# Patient Record
Sex: Female | Born: 1935 | Race: White | Hispanic: No | Smoking: Former smoker
Health system: Southern US, Community
[De-identification: ages and names within clinical notes are randomized; demographics above are authoritative.]

## PROBLEM LIST (undated history)

## (undated) DIAGNOSIS — S329XXA Fracture of unspecified parts of lumbosacral spine and pelvis, initial encounter for closed fracture: Secondary | ICD-10-CM

## (undated) DIAGNOSIS — R4182 Altered mental status, unspecified: Secondary | ICD-10-CM

## (undated) DIAGNOSIS — Z87440 Personal history of urinary (tract) infections: Secondary | ICD-10-CM

## (undated) DIAGNOSIS — F419 Anxiety disorder, unspecified: Secondary | ICD-10-CM

## (undated) DIAGNOSIS — M1990 Unspecified osteoarthritis, unspecified site: Secondary | ICD-10-CM

## (undated) DIAGNOSIS — I493 Ventricular premature depolarization: Secondary | ICD-10-CM

## (undated) DIAGNOSIS — G319 Degenerative disease of nervous system, unspecified: Principal | ICD-10-CM

## (undated) DIAGNOSIS — I471 Supraventricular tachycardia, unspecified: Secondary | ICD-10-CM

## (undated) DIAGNOSIS — H5462 Unqualified visual loss, left eye, normal vision right eye: Secondary | ICD-10-CM

## (undated) DIAGNOSIS — J189 Pneumonia, unspecified organism: Secondary | ICD-10-CM

## (undated) DIAGNOSIS — M810 Age-related osteoporosis without current pathological fracture: Secondary | ICD-10-CM

## (undated) DIAGNOSIS — F0280 Dementia in other diseases classified elsewhere without behavioral disturbance: Secondary | ICD-10-CM

## (undated) DIAGNOSIS — I10 Essential (primary) hypertension: Secondary | ICD-10-CM

## (undated) DIAGNOSIS — I679 Cerebrovascular disease, unspecified: Secondary | ICD-10-CM

## (undated) DIAGNOSIS — G8929 Other chronic pain: Secondary | ICD-10-CM

## (undated) DIAGNOSIS — F411 Generalized anxiety disorder: Secondary | ICD-10-CM

## (undated) DIAGNOSIS — M542 Cervicalgia: Secondary | ICD-10-CM

## (undated) DIAGNOSIS — M79646 Pain in unspecified finger(s): Secondary | ICD-10-CM

## (undated) DIAGNOSIS — G309 Alzheimer's disease, unspecified: Secondary | ICD-10-CM

## (undated) DIAGNOSIS — I350 Nonrheumatic aortic (valve) stenosis: Secondary | ICD-10-CM

## (undated) HISTORY — DX: Degenerative disease of nervous system, unspecified: G31.9

## (undated) HISTORY — DX: Supraventricular tachycardia: I47.1

## (undated) HISTORY — DX: Anxiety disorder, unspecified: F41.9

## (undated) HISTORY — DX: Ventricular premature depolarization: I49.3

## (undated) HISTORY — DX: Personal history of urinary (tract) infections: Z87.440

## (undated) HISTORY — DX: Unspecified osteoarthritis, unspecified site: M19.90

## (undated) HISTORY — DX: Generalized anxiety disorder: F41.1

## (undated) HISTORY — DX: Cervicalgia: M54.2

## (undated) HISTORY — DX: Pain in unspecified finger(s): M79.646

## (undated) HISTORY — PX: GLAUCOMA SURGERY: SHX656

## (undated) HISTORY — PX: CATARACT EXTRACTION, BILATERAL: SHX1313

## (undated) HISTORY — PX: APPENDECTOMY: SHX54

## (undated) HISTORY — DX: Essential (primary) hypertension: I10

## (undated) HISTORY — DX: Supraventricular tachycardia, unspecified: I47.10

## (undated) HISTORY — DX: Alzheimer's disease, unspecified: G30.9

## (undated) HISTORY — DX: Other chronic pain: G89.29

## (undated) HISTORY — DX: Nonrheumatic aortic (valve) stenosis: I35.0

## (undated) HISTORY — DX: Dementia in other diseases classified elsewhere without behavioral disturbance: F02.80

## (undated) HISTORY — DX: Fracture of unspecified parts of lumbosacral spine and pelvis, initial encounter for closed fracture: S32.9XXA

## (undated) HISTORY — DX: Cerebrovascular disease, unspecified: I67.9

## (undated) HISTORY — DX: Age-related osteoporosis without current pathological fracture: M81.0

## (undated) HISTORY — DX: Altered mental status, unspecified: R41.82

## (undated) HISTORY — PX: TONSILLECTOMY: SUR1361

## (undated) HISTORY — DX: Unqualified visual loss, left eye, normal vision right eye: H54.62

---

## 1998-04-25 ENCOUNTER — Other Ambulatory Visit: Admission: RE | Admit: 1998-04-25 | Discharge: 1998-04-25 | Payer: Self-pay | Admitting: Internal Medicine

## 1998-08-12 ENCOUNTER — Other Ambulatory Visit: Admission: RE | Admit: 1998-08-12 | Discharge: 1998-08-12 | Payer: Self-pay | Admitting: Internal Medicine

## 1999-09-03 ENCOUNTER — Encounter: Admission: RE | Admit: 1999-09-03 | Discharge: 1999-09-03 | Payer: Self-pay | Admitting: Internal Medicine

## 1999-09-03 ENCOUNTER — Encounter: Payer: Self-pay | Admitting: Internal Medicine

## 1999-09-30 ENCOUNTER — Encounter: Admission: RE | Admit: 1999-09-30 | Discharge: 1999-09-30 | Payer: Self-pay | Admitting: Internal Medicine

## 1999-09-30 ENCOUNTER — Encounter: Payer: Self-pay | Admitting: Internal Medicine

## 2000-02-23 ENCOUNTER — Encounter: Admission: RE | Admit: 2000-02-23 | Discharge: 2000-02-23 | Payer: Self-pay | Admitting: Orthopedic Surgery

## 2000-02-23 ENCOUNTER — Encounter: Payer: Self-pay | Admitting: Orthopedic Surgery

## 2000-11-10 ENCOUNTER — Other Ambulatory Visit: Admission: RE | Admit: 2000-11-10 | Discharge: 2000-11-10 | Payer: Self-pay

## 2001-08-23 ENCOUNTER — Encounter: Admission: RE | Admit: 2001-08-23 | Discharge: 2001-08-23 | Payer: Self-pay | Admitting: Gastroenterology

## 2001-08-23 ENCOUNTER — Encounter: Payer: Self-pay | Admitting: Gastroenterology

## 2001-09-09 LAB — HM COLONOSCOPY

## 2001-09-14 ENCOUNTER — Ambulatory Visit (HOSPITAL_COMMUNITY): Admission: RE | Admit: 2001-09-14 | Discharge: 2001-09-14 | Payer: Self-pay

## 2004-06-23 ENCOUNTER — Emergency Department (HOSPITAL_COMMUNITY): Admission: EM | Admit: 2004-06-23 | Discharge: 2004-06-23 | Payer: Self-pay | Admitting: Emergency Medicine

## 2004-07-07 ENCOUNTER — Encounter: Admission: RE | Admit: 2004-07-07 | Discharge: 2004-07-07 | Payer: Self-pay | Admitting: Internal Medicine

## 2005-02-05 ENCOUNTER — Other Ambulatory Visit: Admission: RE | Admit: 2005-02-05 | Discharge: 2005-02-05 | Payer: Self-pay | Admitting: Internal Medicine

## 2005-10-15 ENCOUNTER — Encounter: Admission: RE | Admit: 2005-10-15 | Discharge: 2005-10-15 | Payer: Self-pay | Admitting: Internal Medicine

## 2005-11-01 DIAGNOSIS — I679 Cerebrovascular disease, unspecified: Secondary | ICD-10-CM

## 2005-11-01 HISTORY — DX: Cerebrovascular disease, unspecified: I67.9

## 2006-05-19 ENCOUNTER — Ambulatory Visit: Payer: Self-pay

## 2006-05-27 ENCOUNTER — Ambulatory Visit: Payer: Self-pay | Admitting: Cardiology

## 2006-12-16 ENCOUNTER — Encounter: Admission: RE | Admit: 2006-12-16 | Discharge: 2006-12-16 | Payer: Self-pay | Admitting: Internal Medicine

## 2006-12-22 ENCOUNTER — Encounter: Admission: RE | Admit: 2006-12-22 | Discharge: 2006-12-22 | Payer: Self-pay | Admitting: Internal Medicine

## 2007-10-17 ENCOUNTER — Encounter: Admission: RE | Admit: 2007-10-17 | Discharge: 2007-10-17 | Payer: Self-pay | Admitting: Internal Medicine

## 2009-01-14 ENCOUNTER — Ambulatory Visit: Payer: Self-pay | Admitting: Internal Medicine

## 2009-01-28 ENCOUNTER — Ambulatory Visit: Payer: Self-pay | Admitting: Internal Medicine

## 2009-05-16 ENCOUNTER — Encounter: Admission: RE | Admit: 2009-05-16 | Discharge: 2009-05-16 | Payer: Self-pay | Admitting: Internal Medicine

## 2009-05-16 ENCOUNTER — Ambulatory Visit: Payer: Self-pay | Admitting: Internal Medicine

## 2009-05-28 ENCOUNTER — Ambulatory Visit: Payer: Self-pay | Admitting: Internal Medicine

## 2009-05-28 ENCOUNTER — Ambulatory Visit (HOSPITAL_COMMUNITY): Admission: RE | Admit: 2009-05-28 | Discharge: 2009-05-28 | Payer: Self-pay | Admitting: Internal Medicine

## 2009-05-28 ENCOUNTER — Ambulatory Visit: Payer: Self-pay | Admitting: *Deleted

## 2009-05-28 ENCOUNTER — Encounter: Payer: Self-pay | Admitting: Internal Medicine

## 2009-09-04 ENCOUNTER — Ambulatory Visit: Payer: Self-pay | Admitting: Internal Medicine

## 2009-09-19 ENCOUNTER — Ambulatory Visit: Payer: Self-pay | Admitting: Internal Medicine

## 2010-02-10 ENCOUNTER — Ambulatory Visit: Payer: Self-pay | Admitting: Internal Medicine

## 2010-03-23 ENCOUNTER — Inpatient Hospital Stay (HOSPITAL_COMMUNITY): Admission: AC | Admit: 2010-03-23 | Discharge: 2010-03-25 | Payer: Self-pay

## 2010-03-25 ENCOUNTER — Encounter (INDEPENDENT_AMBULATORY_CARE_PROVIDER_SITE_OTHER): Payer: Self-pay | Admitting: General Surgery

## 2010-03-27 ENCOUNTER — Ambulatory Visit: Payer: Self-pay | Admitting: Internal Medicine

## 2010-03-28 ENCOUNTER — Encounter: Admission: RE | Admit: 2010-03-28 | Discharge: 2010-03-28 | Payer: Self-pay | Admitting: Internal Medicine

## 2010-03-28 ENCOUNTER — Ambulatory Visit: Payer: Self-pay | Admitting: Internal Medicine

## 2010-04-04 ENCOUNTER — Ambulatory Visit: Payer: Self-pay | Admitting: Internal Medicine

## 2010-04-16 ENCOUNTER — Ambulatory Visit: Payer: Self-pay | Admitting: Internal Medicine

## 2010-04-25 ENCOUNTER — Encounter: Payer: Self-pay | Admitting: Cardiology

## 2010-04-25 ENCOUNTER — Ambulatory Visit: Admission: RE | Admit: 2010-04-25 | Discharge: 2010-04-25 | Payer: Self-pay | Admitting: Internal Medicine

## 2010-04-25 ENCOUNTER — Ambulatory Visit: Payer: Self-pay | Admitting: Vascular Surgery

## 2010-04-25 ENCOUNTER — Encounter: Payer: Self-pay | Admitting: Internal Medicine

## 2010-04-25 ENCOUNTER — Ambulatory Visit: Payer: Self-pay | Admitting: Internal Medicine

## 2010-05-05 ENCOUNTER — Ambulatory Visit: Payer: Self-pay | Admitting: Internal Medicine

## 2010-05-09 ENCOUNTER — Ambulatory Visit: Payer: Self-pay | Admitting: Internal Medicine

## 2010-05-27 ENCOUNTER — Ambulatory Visit: Payer: Self-pay | Admitting: Cardiology

## 2010-05-27 DIAGNOSIS — I359 Nonrheumatic aortic valve disorder, unspecified: Secondary | ICD-10-CM | POA: Insufficient documentation

## 2010-05-27 DIAGNOSIS — R072 Precordial pain: Secondary | ICD-10-CM | POA: Insufficient documentation

## 2010-05-27 DIAGNOSIS — R002 Palpitations: Secondary | ICD-10-CM

## 2010-05-30 ENCOUNTER — Encounter: Admission: RE | Admit: 2010-05-30 | Discharge: 2010-05-30 | Payer: Self-pay | Admitting: Internal Medicine

## 2010-06-09 ENCOUNTER — Ambulatory Visit: Payer: Self-pay | Admitting: Internal Medicine

## 2010-06-17 ENCOUNTER — Encounter: Admission: RE | Admit: 2010-06-17 | Discharge: 2010-06-17 | Payer: Self-pay | Admitting: Internal Medicine

## 2010-06-24 ENCOUNTER — Ambulatory Visit: Payer: Self-pay | Admitting: Internal Medicine

## 2010-07-01 ENCOUNTER — Encounter: Admission: RE | Admit: 2010-07-01 | Discharge: 2010-09-04 | Payer: Self-pay | Admitting: Internal Medicine

## 2010-07-03 ENCOUNTER — Ambulatory Visit: Payer: Self-pay | Admitting: Internal Medicine

## 2010-08-07 ENCOUNTER — Ambulatory Visit: Payer: Self-pay | Admitting: Internal Medicine

## 2010-09-18 ENCOUNTER — Ambulatory Visit: Payer: Self-pay | Admitting: Internal Medicine

## 2010-09-22 ENCOUNTER — Encounter: Admission: RE | Admit: 2010-09-22 | Discharge: 2010-09-22 | Payer: Self-pay | Admitting: Internal Medicine

## 2010-09-22 ENCOUNTER — Ambulatory Visit: Payer: Self-pay | Admitting: Internal Medicine

## 2010-10-06 ENCOUNTER — Ambulatory Visit: Payer: Self-pay

## 2010-11-05 ENCOUNTER — Telehealth (INDEPENDENT_AMBULATORY_CARE_PROVIDER_SITE_OTHER): Payer: Self-pay | Admitting: *Deleted

## 2010-11-06 ENCOUNTER — Ambulatory Visit: Admission: RE | Admit: 2010-11-06 | Discharge: 2010-11-06 | Payer: Self-pay | Source: Home / Self Care

## 2010-11-06 ENCOUNTER — Encounter: Payer: Self-pay | Admitting: Cardiology

## 2010-12-01 ENCOUNTER — Ambulatory Visit
Admission: RE | Admit: 2010-12-01 | Discharge: 2010-12-01 | Payer: Self-pay | Source: Home / Self Care | Attending: Internal Medicine | Admitting: Internal Medicine

## 2010-12-01 ENCOUNTER — Encounter: Payer: Self-pay | Admitting: Internal Medicine

## 2010-12-01 ENCOUNTER — Telehealth (INDEPENDENT_AMBULATORY_CARE_PROVIDER_SITE_OTHER): Payer: Self-pay | Admitting: *Deleted
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
## 2010-12-11 NOTE — Assessment & Plan Note (Signed)
Summary: dizziness per pt request   Visit Type:  rov Primary Debroah Warren:  Dr. Lenord Fellers  CC:  palpitations.  History of Present Illness: The patient presents for evaluation of palpitations. She called to be added to the schedule after experiencing palpitations 2 nights ago. She described thumping or skipping heartbeat. This occurred repeatedly. She did not describe sustained tachycardia arrhythmias. She did not have presyncope or syncope.  She had no chest, neck or arm pain.  Last night she felt well and she has had no palpitaitons during the day.    Current Medications (verified): 1)  Detrol 2 Mg Tabs (Tolterodine Tartrate) .Marland Kitchen.. 1 Tab Once Daily 2)  Aspirin 81 Mg Tbec (Aspirin) .... Take One Tablet By Mouth Daily 3)  Icaps  Caps (Multiple Vitamins-Minerals) .... Take 1 Capsule By Mouth Once A Day 4)  Vitamin D 1000 Unit Tabs (Cholecalciferol) .... Take 1 Tablet By Mouth Once A Day 5)  B Complex  Tabs (B Complex Vitamins) .... Take 1 Tablet By Mouth Once A Day 6)  Miralax  Powd (Polyethylene Glycol 3350) .... Once Daily  Allergies: 1)  ! Codeine  Past History:  Past Medical History: Reviewed history from 05/27/2010 and no changes required. Glaucoma Mild anxiety PVCs  Past Surgical History: Reviewed history from 05/27/2010 and no changes required. Tonsillectomy Appendectomy Left eye surgery for glaucoma Eyelid surgery  Review of Systems       As stated in the HPI and negative for all other systems.   Vital Signs:  Patient profile:   77 year old female Height:      62 inches Weight:      121.50 pounds BMI:     22.30 Pulse rate:   83 / minute Pulse rhythm:   irregular BP sitting:   136 / 82  (right arm) Cuff size:   regular  Vitals Entered By: Danielle Rankin, CMA (November 06, 2010 4:40 PM)  Physical Exam  General:  Well developed, well nourished, in no acute distress. Head:  normocephalic and atraumatic Neck:  Neck supple, no JVD. No masses, thyromegaly or abnormal  cervical nodes. Chest Wall:  no deformities or breast masses noted Lungs:  Clear bilaterally to auscultation and percussion. Abdomen:  Bowel sounds positive; abdomen soft and non-tender without masses, organomegaly, or hernias noted. No hepatosplenomegaly. Msk:  Back lordosis, normal gait. Muscle strength and tone normal. Extremities:  No clubbing or cyanosis. Neurologic:  Alert and oriented x 3. Skin:  Intact without lesions or rashes. Cervical Nodes:  no significant adenopathy Inguinal Nodes:  no significant adenopathy Psych:  Normal affect.   Detailed Cardiovascular Exam  Neck    Carotids: Carotids full and equal bilaterally without bruits., transmitted systolic murmur    Neck Veins: Normal, no JVD.    Heart    Inspection: no deformities or lifts noted.      Palpation: normal PMI with no thrills palpable.      Auscultation: S1 and S2 within normal limits, no S3, no S4, no clicks, no rubs, 3/6 apical systolic murmur radiating up the aortic outflow tract, no diastolic murmurs  Vascular    Abdominal Aorta: no palpable masses, pulsations, or audible bruits.      Femoral Pulses: normal femoral pulses bilaterally.      Pedal Pulses: normal pedal pulses bilaterally.      Radial Pulses: normal radial pulses bilaterally.      Peripheral Circulation: no clubbing, cyanosis, or edema noted with normal capillary refill.     EKG  Procedure date:  11/06/2010  Findings:      Sinus rhythm, rate 83, left axis deviation, left atrial enlargement, no acute ST-T wave changes  Impression & Recommendations:  Problem # 1:  PALPITATIONS (ICD-785.1) I suspect that the patient has been feeling PVCs that have been documented before. I am going to give her metoprolol immediate release 25 mg to take as needed for palpitations. If she has more symptoms and this therapy doesn't work we will consider daily dosing or other medication. Orders: EKG w/ Interpretation (93000)  Problem # 2:  AORTIC VALVE  DISORDERS (ICD-424.1) She has mild aortic stenosis which we will follow clinically. She would not want aggressive therapy for this.  Patient Instructions: 1)  Your physician recommends that you schedule a follow-up appointment in: 3 months with Dr Antoine Poche 2)  Your physician has recommended you make the following change in your medication: take Metoprolol tartrate as needed daily Prescriptions: METOPROLOL TARTRATE 25 MG TABS (METOPROLOL TARTRATE) one daily as needed  #30 x 6   Entered by:   Charolotte Capuchin, RN   Authorized by:   Rollene Rotunda, MD, [facility]   Signed by:   Charolotte Capuchin, RN on 11/06/2010   Method used:   Electronically to        [facility] (retail)       [address]       [place], [place]  [id number]       Ph: [phone] or [phone]       Fax: [phone]   RxID:   [id number]  I have reviewed and approved all prescriptions at the time of this visit. Rollene Rotunda, MD, [facility]  November 06, 2010 5:45 PM

## 2010-12-11 NOTE — Progress Notes (Signed)
Summary: Dr Shar[facility] Office Note   Dr Sharlet [facility] Office Note   Imported By: Roderic Ovens 06/05/2010 10:25:00  _____________________________________________________________________  External Attachment:    Type:   Image     Comment:   External Document

## 2010-12-11 NOTE — Progress Notes (Signed)
Summary: pt wants to be seen today she had palpitations  Phone Note Call from Patient Call back at Home Phone [phone]   Caller: Patient Reason for Call: Talk to Nurse, Talk to Doctor Summary of Call: pt is having papitations,cold sweat, up all night this went on longer than before pt also was dizzy and nausa pt has not slept all night wants to seen today  Initial call taken by: Omer Jack,  November 05, 2010 8:34 AM  Follow-up for Phone Call        Pt. called stating that she was awake most of the night last night due to palpitations, dizziness and cold swets nauseas. Pt. states she  called the emergency services in her housing  fascility about 8:00 PM. The nurse took her V/S B/P 120/70  pulse 100 beats per minute. Pt. said, she had the palpitations most of the night. Pt. denies having palpitatins ,dizziness, cold swets nor nauseas  since 7:30 to 8:30 AM. She would like to be seen today. Follow-up by: Ollen Gross, RN, BSN,  November 05, 2010 9:22 AM  Additional Follow-up for Phone Call Additional follow up Details #1::        Per pt - no complaints or s/s today.  feels fine.  States this is new for her and would like to be "checked out".  Appt given for 11/29 at 4:30 pm.  Pt aware if s/s reoccur to call back Additional Follow-up by: Charolotte Capuchin, RN,  November 05, 2010 10:01 AM

## 2010-12-11 NOTE — Assessment & Plan Note (Signed)
Summary: EKG   WALK IN  [facility],RN  Nurse Visit   Vital Signs:  Patient profile:   77 year old female Height:      62 inches Weight:      7.75 pounds Pulse rate:   88 / minute Pulse rhythm:   irregular BP sitting:   158 / 78  (right arm)  Current Medications (verified): 1)  Detrol Dose? .... Once Daily 2)  Aspirin 81 Mg Tbec (Aspirin) .... Take One Tablet By Mouth Daily 3)  Icaps  Caps (Multiple Vitamins-Minerals) .... Take 1 Capsule By Mouth Once A Day 4)  Vitamin D 1000 Unit Tabs (Cholecalciferol) .... Take 1 Tablet By Mouth Once A Day 5)  B Complex  Tabs (B Complex Vitamins) .... Take 1 Tablet By Mouth Once A Day 6)  Miralax  Powd (Polyethylene Glycol 3350) .... Once Daily  Allergies (verified): 1)  ! Codeine Pt came in today for eval .  States that she wofke late Saturday/early Sunday AM with "fibulations of the heart" that lasted longer than usual.  She broke out in a sweat and was dizzy but went back to sleep.  Got up at the usual time and was checked by the nurse who told her to see her primary MD.  States she was going to see Dr Lenord Fellers today however she was told to come here.  Walked in to be seen,  pt denies any palps, SOB, dizziness or chest pain.  She has had no s/s since those she had early Sunday am.  Pt is aware Dr Antoine Poche not in the office today but I will send information of the above to him for his evaluation.  Again pt denies any and all problems and/or compliants today, just wanted [place] to know what was going on with her. Of note - pt did have a fall 2 weeks ago while trying to get to the bathroom.  She fell and broke 3 ribs and Dr Lenord Fellers is treating her for this with tramadol for pain.  Patient Instructions: 1)  Your physician recommends that you schedule a follow-up appointment as needed.  You will be called with an appointment with Dr Antoine Poche 2)  Your physician recommends that you continue on your current medications as directed. Please refer to the Current Medication  list given to you today. 3)  You have been diagnosed with palpitations. Palpitations are described as a gradual or sudden awareness of the beating of your heart. It may last seconds, minutes, hours, or days and may be caused by the heart beating slower, faster, more strongly, or more irregularly than normal. They are very common and usually not dangerous. Please see the handout/brochure given to you today for more information.

## 2010-12-11 NOTE — Progress Notes (Signed)
Summary: Records Request  Faxed OV & EKG to [facility] at Dr. Beryle Quant Office ([phone]). Debby Freiberg  December 01, 2010 4:37 PM

## 2011-01-14 ENCOUNTER — Encounter: Payer: Self-pay | Admitting: Cardiology

## 2011-01-26 LAB — CBC
HCT: 31.9 % — ABNORMAL LOW (ref 36.0–46.0)
HCT: 32.8 % — ABNORMAL LOW (ref 36.0–46.0)
MCV: 92 fL (ref 78.0–100.0)
MCV: 92.8 fL (ref 78.0–100.0)
Platelets: 152 10*3/uL (ref 150–400)
RBC: 3.53 MIL/uL — ABNORMAL LOW (ref 3.87–5.11)
RDW: 15 % (ref 11.5–15.5)
WBC: 4.7 10*3/uL (ref 4.0–10.5)
WBC: 5.6 10*3/uL (ref 4.0–10.5)

## 2011-01-26 LAB — BASIC METABOLIC PANEL
Chloride: 104 mEq/L (ref 96–112)
GFR calc Af Amer: >60 mL/min (ref >60)
GFR calc non Af Amer: 57 mL/min — ABNORMAL LOW (ref >60)
Potassium: 4.1 mEq/L (ref 3.5–5.1)

## 2011-01-26 LAB — POCT I-STAT, CHEM 8
Chloride: 104 meq/L (ref 96–112)
HCT: 39 % (ref 36.0–46.0)
Hemoglobin: 13.3 g/dL (ref 12.0–15.0)
Potassium: 4.4 meq/L (ref 3.5–5.1)
Sodium: 138 meq/L (ref 135–145)

## 2011-01-26 LAB — SAMPLE TO BLOOD BANK

## 2011-01-27 ENCOUNTER — Ambulatory Visit: Payer: Self-pay | Admitting: Cardiology

## 2011-02-03 ENCOUNTER — Ambulatory Visit (INDEPENDENT_AMBULATORY_CARE_PROVIDER_SITE_OTHER): Payer: Medicare Other | Admitting: Internal Medicine

## 2011-02-03 DIAGNOSIS — R319 Hematuria, unspecified: Secondary | ICD-10-CM

## 2011-03-05 ENCOUNTER — Encounter: Payer: Self-pay | Admitting: Cardiology

## 2011-03-05 ENCOUNTER — Ambulatory Visit (INDEPENDENT_AMBULATORY_CARE_PROVIDER_SITE_OTHER): Payer: BC Managed Care – PPO | Admitting: Cardiology

## 2011-03-05 DIAGNOSIS — R002 Palpitations: Secondary | ICD-10-CM

## 2011-03-05 DIAGNOSIS — I359 Nonrheumatic aortic valve disorder, unspecified: Secondary | ICD-10-CM

## 2011-03-05 DIAGNOSIS — R072 Precordial pain: Secondary | ICD-10-CM

## 2011-03-05 NOTE — Assessment & Plan Note (Signed)
These are not particularly symptomatic at this point.  I would not think that further monitoring is indicated.  She will continue with PRN metoprolol.

## 2011-03-05 NOTE — Assessment & Plan Note (Signed)
This is mild.  No further work up is indicated.

## 2011-03-05 NOTE — Progress Notes (Signed)
HPI The patient presents for follow up of palpitations.  Since I last saw her she has no further severe episodes like she had in Dec.  She has had no presyncope or syncope. She has had some mild tachycardia palpitations and she occasionally will take metoprolol. However, she's not having any new chest discomfort, neck or arm discomfort. She's not adding new shortness of breath, PND or orthopnea.  Allergies  Allergen Reactions  . Codeine     Current Outpatient Prescriptions  Medication Sig Dispense Refill  . aspirin 81 MG tablet Take 81 mg by mouth daily.        Marland Kitchen b complex vitamins tablet Take 1 tablet by mouth daily.        . cholecalciferol (VITAMIN D) 1000 UNITS tablet Take 1,000 Units by mouth daily.        . metoprolol tartrate (LOPRESSOR) 25 MG tablet Take 25 mg by mouth as needed.        . Multiple Vitamins-Minerals (ICAPS PO) Take 1 tablet by mouth daily.        . Polyethylene Glycol 3350 POWD daily.        Marland Kitchen tolterodine (DETROL) 2 MG tablet Take 2 mg by mouth daily.          Past Medical History  Diagnosis Date  . Glaucoma   . Anxiety     mild  . PVC (premature ventricular contraction)     Past Surgical History  Procedure Date  . Tonsillectomy   . Appendectomy   . Eye surgery     L for glaucoma    ROS:  As stated in the HPI and negative for all other systems.  PHYSICAL EXAM BP 138/60  Pulse 62  Ht 5\' 4"  (1.626 m)  Wt 125 lb (56.7 kg)  BMI 21.46 kg/m2 GENERAL:  Well appearing, for age HEENT:  Pupils equal round and reactive, fundi not visualized, oral mucosa unremarkable NECK:  No jugular venous distention, waveform within normal limits, carotid upstroke brisk and symmetric, no bruits, no thyromegaly LYMPHATICS:  No cervical, inguinal adenopathy LUNGS:  Clear to auscultation bilaterally BACK:  No CVA tenderness,lordosis CHEST:  Unremarkable HEART:  PMI not displaced or sustained,S1 and S2 within normal limits, no S3, no S4, no clicks, no rubs, systolic murmur  mid peaking ABD:  Flat, positive bowel sounds normal in frequency in pitch, no bruits, no rebound, no guarding, no midline pulsatile mass, no hepatomegaly, no splenomegaly EXT:  2 plus pulses throughout, no edema, no cyanosis no clubbing SKIN:  No rashes no nodules NEURO:  Cranial nerves II through XII grossly intact, motor grossly intact throughout PSYCH:  Cognitively intact, oriented to person place and time   EKG:  Sinus rhythm, rate 62, nonspecific inferior lateral T-wave flattening and slight inversion new since previous.  ASSESSMENT AND PLAN

## 2011-03-05 NOTE — Patient Instructions (Signed)
Continue current medications  Follow up as needed

## 2011-03-05 NOTE — Assessment & Plan Note (Signed)
No further work up is indicated.

## 2011-03-18 ENCOUNTER — Ambulatory Visit: Payer: Self-pay | Admitting: Cardiology

## 2011-03-27 NOTE — Assessment & Plan Note (Signed)
[facility]                              CARDIOLOGY OFFICE NOTE   [name], [name]                        MRN:          [id number]  DATE:05/27/2006                            DOB:          July 23, 1917    PRIMARY:  Brittany Warren. Lenord Fellers, MD.   REASON FOR REFERRAL:  Evaluate patient with frequent ventricular ectopy.   HISTORY OF PRESENT ILLNESS:  The patient is a very lovely 77 year old white  female without prior cardiac history.  She was noted recently to have  frequent ectopy on an EKG.  She wore a Holter monitor which demonstrated  frequent PVCs and ventricular bigeminy.  There was no sustained ventricular  arrhythmia and no symptoms were reported.   The patient says that she does her chores of daily living with a little  light housekeeping.  She has not been walking, but plans to get into this.  Says she has had no limitations in her activity.  She does occasionally  notice the palpitations only at night.  She says it is a skipped beat and it  is not particularly symptomatic.  She has no pain with this.  She has no  shortness of breath.  She denies any PND or orthopnea.  She has had no  syncope or presyncope.   PAST MEDICAL HISTORY:  The patient has no history of hypertension, diabetes  or hyperlipidemia.   PAST SURGICAL HISTORY:  1.  Tonsillectomy in 1947.  2.  Appendectomy in 1932.  3.  Left eye surgery.   ALLERGIES:  CODEINE.   MEDICATIONS:  1.  Aspirin 81 mg daily.  2.  Multivitamin.   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  She is a widow.  She has no children.  She  quit smoking after being a light smoker for about 40 years.  She quit in  1985.   FAMILY HISTORY:  Remarkable for wonderful genes, with all of her siblings  and parents living to late 66's, mid 62's.   REVIEW OF SYSTEMS:  As stated in the HPI, and negative for all other  systems.   PHYSICAL  EXAMINATION:  The patient is in no distress.  Blood pressure  135/74, heart rate 41 and regular, body mass index 22, weight 130 pounds.  HEENT:  Eyelids unremarkable, pupils equal, round, react to light, fundi not  visualized, oral mucosa unremarkable.  NECK:  No jugular venous distention, wave form within normal limits, carotid  upstroke brisk and symmetric, no bruits, no thyromegaly.  LYMPHATICS:  No cervical, axillary or inguinal adenopathy.  LUNGS:  Clear to auscultation bilaterally.  BACK:  No costovertebral angle tenderness.  CHEST:  Unremarkable.  HEART:  PMI displaced to sustained, S1 and S2 within normal limits, no S3,  no S4, brief systolic murmur heard only after the ectopic beat, no diastolic  murmurs.  ABDOMEN:  Flat, positive bowel sounds normal in frequency and pitch, no  bruits, no rebound, no guarding, no midline pulses, no masses or  splenomegaly.  SKIN:  No  rashes, no nodules.  EXTREMITIES:  Pulses 2+ throughout, no edema, no cyanosis, no clubbing.  NEURO:  Oriented to person, place and time, cranial nerves II-XII grossly  intact, motor grossly intact.   EKG:  Sinus rhythm, rate 71, axis within normal limits, intervals within  normal limits, ventricular ectopy in a trigeminal pattern, no acute ST-T  wave changes.   ASSESSMENT AND PLAN:  1.  Ventricular ectopy, the patient has frequent ventricular ectopy.  She is      not particularly symptomatic with this and, in fact, only describes mild      resting symptoms.  She has an otherwise unremarkable physical exam      except for probable mild aortic sclerosis.  I would not suspect other      valvular abnormalities or left ventricular dysfunction.  She has had      normal electrolytes and TSH.  Given all of this, I would manage her      conservatively and only symptomatically.  She does assure me that if she      ever gets lightheaded or presyncopal then she would let Dr. Lenord Fellers or      myself know, at which point she  would need further evaluation.  2.  Follow up will be as needed.                               Rollene Rotunda, MD, [facility]    JH/MedQ  DD:  05/27/2006  DT:  05/27/2006  Job #:  [id number]   cc:   Brittany Warren. Lenord Fellers, MD

## 2011-03-27 NOTE — Procedures (Signed)
[facility]  Patient:    Brittany Warren, Brittany Warren Visit Number: [id number] MRN: [id number]          Service Type: END Location: ENDO Attending Physician:  Louie Bun Dictated by:   Everardo All Madilyn Fireman, M.D. Proc. Date: 09/14/01 Admit Date:  09/14/2001 Discharge Date: 09/14/2001   CC:         Luanna Cole. Lenord Fellers, M.D.   Procedure Report  PROCEDURE PERFORMED:  Colonoscopy  INDICATION FOR PROCEDURE:  Worsening constipation and general screening in an 77 year old patient with no previous colon screening.  DESCRIPTION OF PROCEDURE:  The patient was placed in the left lateral decubitus position and placed on the pulse monitor with continuous low flow oxygen delivered by nasal cannula.  She was sedated with 50 mg IV Demerol and 4 mg IV Versed.  The Olympus video colonoscope was inserted into the rectum and advanced to the cecum, confirmed by the transillumination at McBurneys point and visualization of the ileocecal valve and appendiceal orifice.  The prep was excellent.  The cecum, ascending, and transverse colon appeared normal with no masses, polyps, diverticula or other mucosal abnormalities. The sigmoid colon showed some diverticula but was otherwise unremarkable.  The rectum likewise appeared normal on retroflex view.  The anus revealed no obvious internal hemorrhoids.  The colonoscope was then withdrawn and the patient returned to the recovery room in stable condition.  She tolerated the procedure well and there were no immediate complications.  IMPRESSION: Diverticulosis, otherwise normal colonoscopy.  PLAN:  Continuation of MiraLax for her constipation. Dictated by:   Everardo All Madilyn Fireman, M.D. Attending Physician:  Louie Bun DD:  09/14/01 TD:  09/16/01 Job: [id number]

## 2011-04-07 ENCOUNTER — Ambulatory Visit (INDEPENDENT_AMBULATORY_CARE_PROVIDER_SITE_OTHER): Payer: BC Managed Care – PPO | Admitting: Internal Medicine

## 2011-04-07 ENCOUNTER — Encounter: Payer: Self-pay | Admitting: Internal Medicine

## 2011-04-07 VITALS — BP 136/60 | HR 88 | Temp 99.5°F | Ht 64.0 in | Wt 125.0 lb

## 2011-04-07 DIAGNOSIS — M1990 Unspecified osteoarthritis, unspecified site: Secondary | ICD-10-CM

## 2011-04-07 DIAGNOSIS — M810 Age-related osteoporosis without current pathological fracture: Secondary | ICD-10-CM

## 2011-04-07 DIAGNOSIS — Z87440 Personal history of urinary (tract) infections: Secondary | ICD-10-CM

## 2011-04-07 DIAGNOSIS — H409 Unspecified glaucoma: Secondary | ICD-10-CM

## 2011-04-07 DIAGNOSIS — M542 Cervicalgia: Secondary | ICD-10-CM

## 2011-04-07 DIAGNOSIS — H5460 Unqualified visual loss, one eye, unspecified: Secondary | ICD-10-CM

## 2011-04-07 DIAGNOSIS — H5462 Unqualified visual loss, left eye, normal vision right eye: Secondary | ICD-10-CM

## 2011-04-08 ENCOUNTER — Encounter: Payer: Self-pay | Admitting: Internal Medicine

## 2011-04-08 DIAGNOSIS — M810 Age-related osteoporosis without current pathological fracture: Secondary | ICD-10-CM

## 2011-04-08 DIAGNOSIS — H409 Unspecified glaucoma: Secondary | ICD-10-CM | POA: Insufficient documentation

## 2011-04-08 DIAGNOSIS — Z87440 Personal history of urinary (tract) infections: Secondary | ICD-10-CM | POA: Insufficient documentation

## 2011-04-08 DIAGNOSIS — H5462 Unqualified visual loss, left eye, normal vision right eye: Secondary | ICD-10-CM | POA: Insufficient documentation

## 2011-04-08 HISTORY — DX: Age-related osteoporosis without current pathological fracture: M81.0
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
## 2011-04-23 ENCOUNTER — Telehealth: Payer: Self-pay | Admitting: Internal Medicine

## 2011-04-24 NOTE — Telephone Encounter (Signed)
I have left him a voice mail message. We need release from pt to speak with him and give out medical info.

## 2011-05-29 ENCOUNTER — Telehealth: Payer: Self-pay | Admitting: Cardiology

## 2011-05-29 NOTE — Telephone Encounter (Signed)
Pt called to make a fu appt with hochrein, appt 8-28, has question re a medication forgot name that she's taking more than one of at times

## 2011-05-29 NOTE — Telephone Encounter (Signed)
Brittany Warren calling stating she's a little apprehensive about taking metoprolol for palps--states she has been taking every am--she also states Brittany Warren told her she should probably wear a monitor also--Brittany Warren seems alittle confused about who's advice to follow--she has already sched a f/u appoint with dr Antoine Poche, altho it appears dr hochrein has returned her care back to dr Warren--advised to keep appoint with dr hochrein if she continues to be apprehensive about taking metoprolol--pt agrees--nt

## 2011-05-30 ENCOUNTER — Other Ambulatory Visit: Payer: Self-pay | Admitting: Cardiology

## 2011-07-07 ENCOUNTER — Encounter: Payer: Self-pay | Admitting: Cardiology

## 2011-07-07 ENCOUNTER — Ambulatory Visit (INDEPENDENT_AMBULATORY_CARE_PROVIDER_SITE_OTHER): Payer: BC Managed Care – PPO | Admitting: Cardiology

## 2011-07-07 DIAGNOSIS — I359 Nonrheumatic aortic valve disorder, unspecified: Secondary | ICD-10-CM

## 2011-07-07 DIAGNOSIS — R002 Palpitations: Secondary | ICD-10-CM

## 2011-07-07 DIAGNOSIS — I10 Essential (primary) hypertension: Secondary | ICD-10-CM | POA: Insufficient documentation

## 2011-07-07 MED ORDER — METOPROLOL TARTRATE 25 MG PO TABS: 25.0000 mg | ORAL_TABLET | ORAL | Status: DC | PRN

## 2011-07-07 NOTE — Assessment & Plan Note (Signed)
She has the murmur as described.  However, we are following this clinically.

## 2011-07-07 NOTE — Assessment & Plan Note (Signed)
I have discussed strategies for her. She will resume drinking an occasional glass of wine. If the palpitations recur she will start taking her metoprolol nightly. If this doesn't work she might well have to abstain from alcohol if we have demonstrated a clear association.

## 2011-07-07 NOTE — Patient Instructions (Signed)
The current medical regimen is effective;  continue present plan and medications.  Follow up in 6 months with Dr Hochrein.  You will receive a letter in the mail 2 months before you are due.  Please call us when you receive this letter to schedule your follow up appointment.  

## 2011-07-07 NOTE — Progress Notes (Signed)
HPI The patient presents for follow up of palpitations.  Since I last saw her she has had increased palpitations.  She has noticed this more at night. She actually has been in association that this happens more after she drinks a little alcohol. She has been abstaining and is waiting to see if there is continued improvement. She would ultimately like to be able to drink wine or cocktail occasionally. She has on occasion taken her beta blocker but she was confused as to how she might take it prophylactically rather than reactively.  She has not had any presyncope or syncope. She has not had any chest discomfort, neck or arm discomfort. She has had no weight gain or edema.  Allergies  Allergen Reactions  . Codeine     Current Outpatient Prescriptions  Medication Sig Dispense Refill  . aspirin 81 MG tablet Take 81 mg by mouth daily.        Marland Kitchen b complex vitamins tablet Take 1 tablet by mouth daily.        . cholecalciferol (VITAMIN D) 1000 UNITS tablet Take 1,000 Units by mouth daily.        . metoprolol tartrate (LOPRESSOR) 25 MG tablet TAKE ONE TABLET DAILY AS NEEDED  30 tablet  1  . Multiple Vitamins-Minerals (ICAPS PO) Take 1 tablet by mouth daily.        . Polyethylene Glycol 3350 POWD daily.        Marland Kitchen tolterodine (DETROL) 2 MG tablet Take 2 mg by mouth daily.          Past Medical History  Diagnosis Date  . Glaucoma   . Anxiety     mild  . PVC (premature ventricular contraction)   . Osteoporosis   . History of recurrent UTIs   . Vision loss of left eye     Past Surgical History  Procedure Date  . Tonsillectomy   . Appendectomy   . Eye surgery     L for glaucoma    ROS:  As stated in the HPI and negative for all other systems.  PHYSICAL EXAM BP 153/66  Pulse 68  Resp 16  Ht 5\' 4"  (1.626 m)  Wt 124 lb (56.246 kg)  BMI 21.28 kg/m2 GENERAL:  Well appearing, for age HEENT:  Pupils equal round and reactive, fundi not visualized, oral mucosa unremarkable NECK:  No jugular  venous distention, waveform within normal limits, carotid upstroke brisk and symmetric, no bruits, no thyromegaly LYMPHATICS:  No cervical, inguinal adenopathy LUNGS:  Clear to auscultation bilaterally BACK:  No CVA tenderness,lordosis CHEST:  Unremarkable HEART:  PMI not displaced or sustained,S1 and S2 within normal limits, no S3, no S4, no clicks, no rubs, systolic murmur mid peaking ABD:  Flat, positive bowel sounds normal in frequency in pitch, no bruits, no rebound, no guarding, no midline pulsatile mass, no hepatomegaly, no splenomegaly EXT:  2 plus pulses throughout, no edema, no cyanosis no clubbing SKIN:  No rashes no nodules NEURO:  Cranial nerves II through XII grossly intact, motor grossly intact throughout PSYCH:  Cognitively intact, oriented to person place and time   EKG:  Sinus rhythm, rate 66, nonspecific inferior lateral T-wave flattening and slight inversion unchanged from previous.  ASSESSMENT AND PLAN

## 2011-08-06 ENCOUNTER — Encounter: Payer: Self-pay | Admitting: Internal Medicine

## 2011-08-06 ENCOUNTER — Ambulatory Visit (INDEPENDENT_AMBULATORY_CARE_PROVIDER_SITE_OTHER): Payer: BC Managed Care – PPO | Admitting: Internal Medicine

## 2011-08-06 VITALS — BP 136/56 | HR 80 | Temp 99.2°F | Ht 60.0 in | Wt 125.5 lb

## 2011-08-06 DIAGNOSIS — M542 Cervicalgia: Secondary | ICD-10-CM | POA: Insufficient documentation

## 2011-08-06 DIAGNOSIS — M79646 Pain in unspecified finger(s): Secondary | ICD-10-CM

## 2011-08-06 DIAGNOSIS — M79609 Pain in unspecified limb: Secondary | ICD-10-CM

## 2011-08-06 DIAGNOSIS — G8929 Other chronic pain: Secondary | ICD-10-CM

## 2011-08-06 DIAGNOSIS — M1990 Unspecified osteoarthritis, unspecified site: Secondary | ICD-10-CM

## 2011-08-06 DIAGNOSIS — R350 Frequency of micturition: Secondary | ICD-10-CM

## 2011-08-06 DIAGNOSIS — IMO0001 Reserved for inherently not codable concepts without codable children: Secondary | ICD-10-CM

## 2011-08-06 HISTORY — DX: Pain in unspecified finger(s): M79.646

## 2011-08-06 HISTORY — DX: Other chronic pain: G89.29

## 2011-08-06 HISTORY — DX: Unspecified osteoarthritis, unspecified site: M19.90

## 2011-08-06 LAB — POCT URINALYSIS DIPSTICK
Blood, UA: NEGATIVE
Glucose, UA: NEGATIVE
Nitrite, UA: NEGATIVE
Protein, UA: NEGATIVE
Spec Grav, UA: 1.02
Urobilinogen, UA: NEGATIVE
pH, UA: 6
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
## 2011-09-22 ENCOUNTER — Encounter: Payer: Self-pay | Admitting: Internal Medicine

## 2011-09-22 ENCOUNTER — Ambulatory Visit (INDEPENDENT_AMBULATORY_CARE_PROVIDER_SITE_OTHER): Payer: BC Managed Care – PPO | Admitting: Internal Medicine

## 2011-09-22 VITALS — BP 126/58 | HR 80 | Temp 97.9°F | Wt 125.0 lb

## 2011-09-22 DIAGNOSIS — M79646 Pain in unspecified finger(s): Secondary | ICD-10-CM

## 2011-09-22 DIAGNOSIS — M542 Cervicalgia: Secondary | ICD-10-CM

## 2011-09-22 DIAGNOSIS — M79609 Pain in unspecified limb: Secondary | ICD-10-CM

## 2011-09-22 DIAGNOSIS — L570 Actinic keratosis: Secondary | ICD-10-CM

## 2011-09-22 DIAGNOSIS — G8929 Other chronic pain: Secondary | ICD-10-CM

## 2011-09-22 DIAGNOSIS — Z23 Encounter for immunization: Secondary | ICD-10-CM
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
## 2011-09-22 NOTE — Patient Instructions (Addendum)
Please see Dr. Jorja Loa regarding lesion on your back. Return as needed

## 2011-10-14 ENCOUNTER — Emergency Department (HOSPITAL_COMMUNITY)
Admission: EM | Admit: 2011-10-14 | Discharge: 2011-10-15 | Disposition: A | Discharge status: Home / Self Care | Payer: Medicare Other | Attending: Emergency Medicine | Admitting: Emergency Medicine

## 2011-10-14 ENCOUNTER — Other Ambulatory Visit: Payer: Self-pay

## 2011-10-14 ENCOUNTER — Encounter (HOSPITAL_COMMUNITY): Payer: Self-pay | Admitting: Emergency Medicine

## 2011-10-14 DIAGNOSIS — H409 Unspecified glaucoma: Secondary | ICD-10-CM | POA: Insufficient documentation

## 2011-10-14 DIAGNOSIS — R002 Palpitations: Secondary | ICD-10-CM | POA: Insufficient documentation

## 2011-10-14 DIAGNOSIS — R000 Tachycardia, unspecified: Secondary | ICD-10-CM | POA: Insufficient documentation

## 2011-10-14 DIAGNOSIS — M810 Age-related osteoporosis without current pathological fracture: Secondary | ICD-10-CM | POA: Insufficient documentation

## 2011-10-14 DIAGNOSIS — R079 Chest pain, unspecified: Secondary | ICD-10-CM | POA: Insufficient documentation

## 2011-10-14 DIAGNOSIS — Z7982 Long term (current) use of aspirin: Secondary | ICD-10-CM | POA: Insufficient documentation

## 2011-10-14 DIAGNOSIS — Z79899 Other long term (current) drug therapy: Secondary | ICD-10-CM | POA: Insufficient documentation

## 2011-10-15 ENCOUNTER — Other Ambulatory Visit: Payer: Self-pay

## 2011-10-15 ENCOUNTER — Emergency Department (HOSPITAL_COMMUNITY): Payer: Medicare Other

## 2011-10-15 LAB — COMPREHENSIVE METABOLIC PANEL
ALT: 23 U/L (ref 0–35)
Albumin: 3.5 g/dL (ref 3.5–5.2)
Alkaline Phosphatase: 92 U/L (ref 39–117)
Calcium: 9.2 mg/dL (ref 8.4–10.5)
GFR calc Af Amer: 34 mL/min — ABNORMAL LOW (ref >90)
Glucose, Bld: 143 mg/dL — ABNORMAL HIGH (ref 70–99)
Potassium: 4.6 mEq/L (ref 3.5–5.1)
Sodium: 136 mEq/L (ref 135–145)
Total Protein: 6.3 g/dL (ref 6.0–8.3)

## 2011-10-15 LAB — DIFFERENTIAL
Basophils Relative: 0 % (ref 0–1)
Eosinophils Absolute: 0.1 10*3/uL (ref 0.0–0.7)
Eosinophils Relative: 2 % (ref 0–5)
Lymphs Abs: 0.8 10*3/uL (ref 0.7–4.0)
Neutrophils Relative %: 77 % (ref 43–77)

## 2011-10-15 LAB — CBC
MCH: 30 pg (ref 26.0–34.0)
MCHC: 32.3 g/dL (ref 30.0–36.0)
MCV: 92.8 fL (ref 78.0–100.0)
Platelets: 199 10*3/uL (ref 150–400)
RDW: 14.3 % (ref 11.5–15.5)

## 2011-10-15 LAB — TROPONIN I: Troponin I: <0.3 ng/mL (ref <0.30)

## 2011-10-15 LAB — CK TOTAL AND CKMB (NOT AT ARMC)
Relative Index: INVALID (ref 0.0–2.5)
Total CK: 66 U/L (ref 7–177)

## 2011-10-15 NOTE — ED Provider Notes (Signed)
History     CSN: [id number] Arrival date & time: 10/14/2011 11:15 PM   First MD Initiated Contact with Patient 10/14/11 2351      Chief Complaint  Patient presents with  . Chest Pain    pt states "im having increased palpitations" pt c/o "feeling uncomfortable" in her arms, chest and upper back.     (Consider location/radiation/quality/duration/timing/severity/associated sxs/prior treatment) HPI Comments: Patient returned home from grocery shopping, then developed palpitations, pounding in chest.  She denies pain or shortness of breath.  This lasted for a "little while", then resolved.  Now feels fine.  She denies pain in the chest during, before, or after the episode.    Patient is a 77 y.o. female presenting with chest pain. The history is provided by the patient.  Chest Pain The chest pain began 1 - 2 hours ago. Chest pain occurs constantly. The chest pain is resolved.     Past Medical History  Diagnosis Date  . Glaucoma   . Anxiety     mild  . PVC (premature ventricular contraction)   . Osteoporosis   . History of recurrent UTIs   . Vision loss of left eye     Past Surgical History  Procedure Date  . Tonsillectomy   . Appendectomy   . Eye surgery     L for glaucoma    History reviewed. No pertinent family history.  History  Substance Use Topics  . Smoking status: Former Smoker -- 0.5 packs/day for 30 years    Types: Cigarettes    Quit date: 01/14/1984  . Smokeless tobacco: Never Used  . Alcohol Use: Yes     wine socially    OB History    Grav Para Term Preterm Abortions TAB SAB Ect Mult Living                  Review of Systems  Cardiovascular: Positive for chest pain.  All other systems reviewed and are negative.    Allergies  Codeine  Home Medications   Current Outpatient Rx  Name Route Sig Dispense Refill  . ASPIRIN 81 MG PO TABS Oral Take 81 mg by mouth daily.      . B COMPLEX PO TABS Oral Take 1 tablet by mouth daily.     Marland Kitchen  CALCIUM-VITAMIN D 600-200 MG-UNIT PO TABS Oral Take 1 tablet by mouth 2 (two) times daily.      Marland Kitchen HYPROMELLOSE 2.5 % OP SOLN Both Eyes Place 1 drop into both eyes at bedtime.      Marland Kitchen METOPROLOL TARTRATE 25 MG PO TABS Oral Take 25 mg by mouth as needed. For heart irregularity     . ICAPS PO Oral Take 2 tablets by mouth daily.     . TOLTERODINE TARTRATE 2 MG PO TABS Oral Take 2 mg by mouth daily. Prn only Uses when has a larger than usual fluid intake    . VITAMIN E 100 UNITS PO CAPS Oral Take 100 Units by mouth daily.      Marland Kitchen POLYETHYLENE GLYCOL 3350 POWD Oral Take 1 Package by mouth daily. For constipation    . PROMETHAZINE HCL 25 MG PO TABS Oral Take 25 mg by mouth every 6 (six) hours as needed. nausea       BP 116/66  Pulse 122  Temp(Src) 97.1 F (36.2 C) (Oral)  Resp 20  SpO2 92%  Physical Exam  Nursing note and vitals reviewed. Constitutional: She is oriented to person, place, and time.  She appears well-developed and well-nourished. No distress.  HENT:  Head: Normocephalic and atraumatic.  Neck: Normal range of motion. Neck supple.  Cardiovascular: Regular rhythm.  Exam reveals no gallop and no friction rub.   No murmur heard.      Tachycardia  Pulmonary/Chest: Effort normal and breath sounds normal. No respiratory distress. She has no wheezes.  Abdominal: Soft. Bowel sounds are normal. She exhibits no distension. There is no tenderness.  Musculoskeletal: Normal range of motion.  Neurological: She is alert and oriented to person, place, and time.  Skin: Skin is warm and dry. She is not diaphoretic.    ED Course  Procedures (including critical care time)   Labs Reviewed  CBC  DIFFERENTIAL  COMPREHENSIVE METABOLIC PANEL  CK TOTAL AND CKMB  TROPONIN I   No results found.   No diagnosis found.   Date: 10/15/2011  Rate: 121  Rhythm: sinus tachycardia  QRS Axis: normal  Intervals: PR prolonged  ST/T Wave abnormalities: normal  Conduction Disutrbances:first-degree  A-V block   Narrative Interpretation:   Old EKG Reviewed: none available    MDM  Patient arrived in some sort of junctional tachycardia or sinus tachycardia with 1st degree block.  While here, she went back to a nsr and feels fine.  The labs look fine.  Will discharge to home.        Geoffery Lyons, MD 10/15/11 [phone]

## 2011-11-05 ENCOUNTER — Encounter: Payer: Self-pay | Admitting: Physician Assistant

## 2011-11-05 ENCOUNTER — Ambulatory Visit (INDEPENDENT_AMBULATORY_CARE_PROVIDER_SITE_OTHER): Payer: BC Managed Care – PPO | Admitting: Physician Assistant

## 2011-11-05 DIAGNOSIS — I359 Nonrheumatic aortic valve disorder, unspecified: Secondary | ICD-10-CM

## 2011-11-05 DIAGNOSIS — I10 Essential (primary) hypertension: Secondary | ICD-10-CM

## 2011-11-05 DIAGNOSIS — R002 Palpitations: Secondary | ICD-10-CM

## 2011-11-05 LAB — TSH: TSH: 1.43 u[IU]/mL (ref 0.35–5.50)

## 2011-11-05 MED ORDER — METOPROLOL TARTRATE 25 MG PO TABS: 25.0000 mg | ORAL_TABLET | Freq: Two times a day (BID) | ORAL | Status: DC

## 2011-11-05 NOTE — Assessment & Plan Note (Addendum)
I reviewed her ECG from the ER with Dr. Johney Frame.  He suspects that her rhythm was either an accelerated junctional rhythm versus an ectopic atrial tachycardia.  We discussed whether or not to proceed with an echocardiogram and event monitor.  He did not feel that this was necessary at this time.  He did suggest adjusting her metoprolol.  I will have her take her metoprolol twice a day to see if this helps her symptoms.  She will be brought back in followup with Dr. Antoine Poche 4 weeks.  Check a TSH today.

## 2011-11-05 NOTE — Patient Instructions (Addendum)
Your physician recommends that you schedule a follow-up appointment in: 4 WEEKS with Dr Antoine Poche  Your physician has recommended you make the following change in your medication: Increase Metoprolol Tartrate to 25mg  take one by mouth twice a day  Your physician recommends that you have lab work today: TSH

## 2011-11-05 NOTE — Assessment & Plan Note (Signed)
Controlled.  

## 2011-11-05 NOTE — Progress Notes (Signed)
[address] [place], [place]  [address] Phone: [phone] Fax:  [phone]  Date:  11/05/2011   Name:  Brittany Warren       DOB:  [birth year] MRN:  [id number]  PCP:  Dr. Lenord Fellers Primary Cardiologist:  Dr. Rollene Rotunda  Primary Electrophysiologist:  None    History of Present Illness: Brittany Warren is a 77 y.o. female who presents for post ED visit.  She has a history of palpitations and has been followed by Dr. Antoine Poche.  She also has history of heart murmur that has been followed clinically.  She was last seen by Dr. Antoine Poche 8/12 with plans for 6 month followup.  She went to the emergency room on 12/5 with chest pain and palpitations.  ECG on admission demonstrated what appears to be SVT with either a retrograde P wave or first degree AV block.  This apparently resolved prior to discharge.  Troponin was negative.  Potassium 4.6, BUN 39, creatinine 1.49, ALT 23, hemoglobin 11.6, chest x-ray demonstrated vascular congestion, mild cardiomegaly and increased interstitial markings questionable for minimal interstitial edema.  Since her visit to the ER, she feels well.  Her only complaint is rapid heartbeat when she has palpitations.  She otherwise denies chest pain, shortness of breath, orthopnea, PND or edema.  She denies syncope or near-syncope.  She takes her metoprolol once a day and then as needed after that.  Past Medical History  Diagnosis Date  . Glaucoma   . Anxiety     mild  . PVC (premature ventricular contraction)   . Osteoporosis   . History of recurrent UTIs   . Vision loss of left eye     Current Outpatient Prescriptions  Medication Sig Dispense Refill  . aspirin 81 MG tablet Take 81 mg by mouth daily.        Marland Kitchen b complex vitamins tablet Take 1 tablet by mouth daily.       . Calcium-Vitamin D 600-200 MG-UNIT per tablet Take 1 tablet by mouth 2 (two) times daily.        . hydroxypropyl methylcellulose (ISOPTO TEARS) 2.5 % ophthalmic solution  Place 1 drop into both eyes at bedtime.        . metoprolol tartrate (LOPRESSOR) 25 MG tablet Take 25 mg by mouth as needed. For heart irregularity       . Multiple Vitamins-Minerals (ICAPS PO) Take 2 tablets by mouth daily.       . Polyethylene Glycol 3350 POWD Take 1 Package by mouth daily. For constipation      . tolterodine (DETROL) 2 MG tablet Take 2 mg by mouth daily. Prn only Uses when has a larger than usual fluid intake      . vitamin E 100 UNIT capsule Take 100 Units by mouth daily.          Allergies: Allergies  Allergen Reactions  . Codeine Nausea Only    History  Substance Use Topics  . Smoking status: Former Smoker -- 0.5 packs/day for 30 years    Types: Cigarettes    Quit date: 01/14/1984  . Smokeless tobacco: Never Used  . Alcohol Use: Yes     wine socially     ROS:  Please see the history of present illness.     All other systems reviewed and negative.   PHYSICAL EXAM: VS:  BP 132/68  Pulse 83  Ht 5' (1.524 m)  Wt 127 lb (57.607 kg)  BMI 24.80 kg/m2  Well nourished, well developed, in no acute distress HEENT: normal Neck: no JVD Cardiac:  normal S1, S2; RRR; 2/6 systolic murmur along LSB Lungs:  clear to auscultation bilaterally, no wheezing, rhonchi or rales Abd: soft, nontender, no hepatomegaly Ext: no edema Skin: warm and dry Neuro:  CNs 2-12 intact, no focal abnormalities noted  EKG:   Sinus rhythm, heart rate 83, normal axis, nonspecific ST-T wave changes, PAC  ASSESSMENT AND PLAN:

## 2011-11-05 NOTE — Assessment & Plan Note (Addendum)
This has been followed conservatively by Dr. Antoine Poche.  Of note, she has no angina, no signs or symptoms of CHF and no h/o syncope.

## 2011-11-24 ENCOUNTER — Encounter: Payer: Self-pay | Admitting: Internal Medicine

## 2011-11-24 ENCOUNTER — Ambulatory Visit (INDEPENDENT_AMBULATORY_CARE_PROVIDER_SITE_OTHER): Payer: Medicare Other | Admitting: Internal Medicine

## 2011-11-24 VITALS — BP 140/64 | HR 80 | Temp 99.3°F | Wt 124.5 lb

## 2011-11-24 DIAGNOSIS — J069 Acute upper respiratory infection, unspecified: Secondary | ICD-10-CM | POA: Diagnosis not present

## 2011-12-02 DIAGNOSIS — C44519 Basal cell carcinoma of skin of other part of trunk: Secondary | ICD-10-CM | POA: Diagnosis not present

## 2011-12-03 ENCOUNTER — Ambulatory Visit: Payer: Medicare Other | Admitting: Cardiology

## 2011-12-14 DIAGNOSIS — H04129 Dry eye syndrome of unspecified lacrimal gland: Secondary | ICD-10-CM | POA: Diagnosis not present

## 2011-12-14 DIAGNOSIS — H02409 Unspecified ptosis of unspecified eyelid: Secondary | ICD-10-CM | POA: Diagnosis not present

## 2011-12-14 DIAGNOSIS — H409 Unspecified glaucoma: Secondary | ICD-10-CM | POA: Diagnosis not present

## 2011-12-14 DIAGNOSIS — H4011X0 Primary open-angle glaucoma, stage unspecified: Secondary | ICD-10-CM | POA: Diagnosis not present

## 2012-01-08 ENCOUNTER — Other Ambulatory Visit: Payer: Self-pay | Admitting: Internal Medicine

## 2012-01-10 ENCOUNTER — Encounter: Payer: Self-pay | Admitting: Internal Medicine

## 2012-01-10 NOTE — Progress Notes (Signed)
  Subjective:    Patient ID: Brittany Warren, female    DOB: 07/24/1917, 78 y.o.   MRN: [id number]  HPI remarkable 78 year old white female in today with URI symptoms. Has had cough and congestion. No documented fever. No discolored sputum production. Has malaise and fatigue.    Review of Systems     Objective:   Physical Exam HEENT exam shows a slightly injected pharynx without exudate. TMs are clear. Neck supple without adenopathy. Chest clear to auscultation.        Assessment & Plan:  Acute upper respiratory infection  Plan: Zithromax Z-Pak take 2 tablets day one followed by 1 tablet by mouth days 2 through 5

## 2012-01-10 NOTE — Patient Instructions (Signed)
Take Zithromax Z-PAK 2 tablets day one followed by 1 tablet days 2 through 5. Call if not better in one week. 

## 2012-01-25 ENCOUNTER — Telehealth: Payer: Self-pay | Admitting: Cardiology

## 2012-01-25 ENCOUNTER — Ambulatory Visit (INDEPENDENT_AMBULATORY_CARE_PROVIDER_SITE_OTHER): Payer: Medicare Other | Admitting: Physician Assistant

## 2012-01-25 ENCOUNTER — Other Ambulatory Visit: Payer: Self-pay | Admitting: *Deleted

## 2012-01-25 ENCOUNTER — Encounter: Payer: Self-pay | Admitting: Physician Assistant

## 2012-01-25 VITALS — BP 143/70 | HR 63 | Ht 64.0 in | Wt 129.0 lb

## 2012-01-25 DIAGNOSIS — R002 Palpitations: Secondary | ICD-10-CM | POA: Diagnosis not present

## 2012-01-25 DIAGNOSIS — I359 Nonrheumatic aortic valve disorder, unspecified: Secondary | ICD-10-CM

## 2012-01-25 DIAGNOSIS — I10 Essential (primary) hypertension: Secondary | ICD-10-CM

## 2012-01-25 MED ORDER — METOPROLOL TARTRATE 25 MG PO TABS: 25.0000 mg | ORAL_TABLET | Freq: Three times a day (TID) | ORAL | Status: DC

## 2012-01-25 MED ORDER — METOPROLOL TARTRATE 25 MG PO TABS: 25.0000 mg | ORAL_TABLET | Freq: Two times a day (BID) | ORAL | Status: DC

## 2012-01-25 NOTE — Telephone Encounter (Signed)
Pt calling complaining of palps that started Saturday evening.  She is scheduled to see Tereso Newcomer for today at 11:50.

## 2012-01-25 NOTE — Progress Notes (Signed)
[address] [place], [place]  [address] Phone: [phone] Fax:  [phone]  Date:  01/25/2012   Name:  Brittany Warren       DOB:  Feb 21, 1917 MRN:  [id number]  PCP:  Dr. Lenord Fellers Primary Cardiologist:  Dr. Rollene Rotunda  Primary Electrophysiologist:  None    History of Present Illness: Brittany Warren is a 78 y.o. female who presents for evaluation of palpitations.    She has a history of palpitations and has been followed by Dr. Antoine Poche.  She also has history of heart murmur that has been followed clinically.  She was last seen by Dr. Antoine Poche 8/12 with plans for 6 month followup.  She went to the emergency room on 10/14/11 with chest pain and palpitations.  ECG on admission demonstrated what appears to be SVT with either a retrograde P wave or first degree AV block.  I saw her in followup 11/05/11.  I reviewed her ECGs with Dr. Johney Frame.  We suspected that her rhythm was either accelerated junctional versus ectopic atrial tachycardia.  We placed her on metoprolol with plans to follow up with Dr. Antoine Poche.  She called in today with recurrent palpitations and was added onto my schedule.  She notes recurrent palpitations 2 nights ago.  She felt diaphoretic and slightly nauseated with an irregular heartbeat.  She also noted left arm heaviness.  She says that overall, she "felt terrible.".  She denies associated chest discomfort or shortness of breath.  The symptoms lasted several hours.  She took an extra metoprolol without relief.  Yesterday she felt well and she has not had any further palpitations today.  She denies exertional chest pain or shortness of breath.  She denies syncope.  Past Medical History  Diagnosis Date  . Glaucoma   . Anxiety     mild  . PVC (premature ventricular contraction)   . Osteoporosis   . History of recurrent UTIs   . Vision loss of left eye     Current Outpatient Prescriptions  Medication Sig Dispense Refill  . aspirin 81 MG  tablet Take 81 mg by mouth daily.        Marland Kitchen b complex vitamins tablet Take 1 tablet by mouth daily.       . Calcium-Vitamin D 600-200 MG-UNIT per tablet Take 1 tablet by mouth 2 (two) times daily.        . hydroxypropyl methylcellulose (ISOPTO TEARS) 2.5 % ophthalmic solution Place 1 drop into both eyes at bedtime.        . metoprolol tartrate (LOPRESSOR) 25 MG tablet Take 1 tablet (25 mg total) by mouth 2 (two) times daily. For heart irregularity  60 tablet  6  . Multiple Vitamins-Minerals (ICAPS PO) Take 2 tablets by mouth daily.       . Polyethylene Glycol 3350 POWD Take 1 Package by mouth daily. For constipation      . tolterodine (DETROL) 2 MG tablet TAKE ONE TABLET TWICE DAILY  60 tablet  11  . vitamin E 100 UNIT capsule Take 100 Units by mouth daily.          Allergies: Allergies  Allergen Reactions  . Codeine Nausea Only    History  Substance Use Topics  . Smoking status: Former Smoker -- 0.5 packs/day for 30 years    Types: Cigarettes    Quit date: 01/14/1984  . Smokeless tobacco: Never Used  . Alcohol Use: Yes     wine socially  ROS:  Please see the history of present illness.   All other systems reviewed and negative.   PHYSICAL EXAM: VS:  BP 117/67  Pulse 66  Ht 5\' 4"  (1.626 m)  Wt 129 lb (58.514 kg)  BMI 22.14 kg/m2  Filed Vitals:   01/25/12 1236 01/25/12 1237 01/25/12 1238 01/25/12 1239  BP: 122/64 117/67 128/68 143/70  Pulse: 60 66 63 63  Height:      Weight:         Well nourished, well developed, in no acute distress HEENT: normal Neck: no JVD at 90 degrees  Cardiac:  normal S1, S2; RRR; 2/6 systolic murmur along RUSB Lungs:  clear to auscultation bilaterally, no wheezing, rhonchi or rales Abd: soft, nontender  Ext: no edema Skin: warm and dry Neuro:  CNs 2-12 intact, no focal abnormalities noted Psych: normal affect   EKG:   Sinus rhythm, heart rate 62, left axis deviation, T wave inversions in leads 3, aVF and V4-V6 similar to prior  tracings.  Initial ECG demonstrated a brief run of atrial tach vs SVT lasting about 2-3 seconds.  ASSESSMENT AND PLAN:   1. SVT  Recurrent SVT.  Discussed with Dr. Rollene Rotunda.  Will continue conservative management as much as possible.  Patient agrees she does not want any "Heruclean efforts."  We discussed adjusting her metoprolol to TID.  But, she notes she is taking it only once daily the majority of the time.  Therefore, she will change to BID.  She can take an extra if necessary.  Follow up with Dr. Rollene Rotunda in one month.   2. HTN (hypertension)  Adjust metoprolol as noted.   3. Aortic valve disorders  Discussed with Dr. Rollene Rotunda.  With advanced age, will continue to defer further testing.      Signed,  Tereso Newcomer, PA-C  1:04 PM 01/25/2012

## 2012-01-25 NOTE — Patient Instructions (Signed)
Your physician recommends that you schedule a follow-up appointment in: 4-6 WEEKS WITH DR. [facility]  Your physician has recommended you make the following change in your medication: INCREASE METOPROLOL TO 25 MG 1 TABLET TWICE DAILY

## 2012-01-25 NOTE — Telephone Encounter (Signed)
Pt calling re palpitations, starting Saturday, denies  Chest pains, SOB, feels she has to lie down or she will faint, requesting appt today, pls call

## 2012-02-09 DIAGNOSIS — H04129 Dry eye syndrome of unspecified lacrimal gland: Secondary | ICD-10-CM | POA: Diagnosis not present

## 2012-02-09 DIAGNOSIS — H5440 Blindness, one eye, unspecified eye: Secondary | ICD-10-CM | POA: Diagnosis not present

## 2012-02-15 ENCOUNTER — Telehealth: Payer: Self-pay | Admitting: Cardiology

## 2012-02-15 NOTE — Telephone Encounter (Signed)
Attempted to call pt to discuss her needs however the phone rang multiple times without an answer or voicemail.

## 2012-02-15 NOTE — Telephone Encounter (Signed)
New msg Pt wants to talk to you about her heart rate. She said she took her meds but doesn't feel quite right. Please call her back

## 2012-02-16 NOTE — Telephone Encounter (Signed)
Per pt - reports having palpitations and a very fast heart rate that is waking her during the night.  This occurred several nights ago and then again night before last.  She will come in to be evaluated on Thursday at 10:30 am with Ward Givens.  Pt will call back if further problems before then.

## 2012-02-18 ENCOUNTER — Encounter: Payer: Self-pay | Admitting: Nurse Practitioner

## 2012-02-18 ENCOUNTER — Ambulatory Visit (INDEPENDENT_AMBULATORY_CARE_PROVIDER_SITE_OTHER): Payer: Medicare Other | Admitting: Nurse Practitioner

## 2012-02-18 VITALS — BP 120/60 | HR 80 | Ht 64.0 in | Wt 127.0 lb

## 2012-02-18 DIAGNOSIS — I10 Essential (primary) hypertension: Secondary | ICD-10-CM | POA: Diagnosis not present

## 2012-02-18 DIAGNOSIS — I498 Other specified cardiac arrhythmias: Secondary | ICD-10-CM | POA: Diagnosis not present

## 2012-02-18 DIAGNOSIS — I359 Nonrheumatic aortic valve disorder, unspecified: Secondary | ICD-10-CM

## 2012-02-18 DIAGNOSIS — I350 Nonrheumatic aortic (valve) stenosis: Secondary | ICD-10-CM

## 2012-02-18 DIAGNOSIS — I471 Supraventricular tachycardia: Secondary | ICD-10-CM | POA: Insufficient documentation

## 2012-02-18 DIAGNOSIS — R002 Palpitations: Secondary | ICD-10-CM

## 2012-02-18 MED ORDER — METOPROLOL TARTRATE 25 MG PO TABS: 37.5000 mg | ORAL_TABLET | Freq: Two times a day (BID) | ORAL | Status: DC

## 2012-02-18 NOTE — Patient Instructions (Signed)
Your physician recommends that you keep your follow-up appointment as schedule with Dr Antoine Poche Your physician has recommended you make the following change in your medication: INCREASE Metoprolol to 37.5 mg (1 1/2 tabs) twice daily

## 2012-02-18 NOTE — Progress Notes (Signed)
Patient Name: Brittany Warren Date of Encounter: 02/18/2012  Primary Care Provider:  Margaree Mackintosh, MD, MD Primary Cardiologist:  J. Hochrein, MD  Patient Profile  78 y/o female with h/o psvt who presents 2/2 more freq palpitations.  Problem List   Past Medical History  Diagnosis Date  . Glaucoma   . Anxiety     mild  . PVC (premature ventricular contraction)   . Osteoporosis   . History of recurrent UTIs   . Vision loss of left eye   . SVT (supraventricular tachycardia)   . Aortic stenosis     a. Echo 03/2010:  EF 70-75%, mild AS   Past Surgical History  Procedure Date  . Tonsillectomy   . Appendectomy   . Eye surgery     L for glaucoma    Allergies  Allergies  Allergen Reactions  . Codeine Nausea Only    HPI  A 78 year old female with prior history of paroxysmal supraventricular tachycardia which has been occurring more frequently over the past few months.  She has tachypalpitations occurring a few times per week and generally lasting 20-30 minutes prior to resolving spontaneously.  Sometimes her palpitations are accompanied by a "funny" right arm sensation and diaphoresis.she continues to take metoprolol 25 mg twice daily.  She denies chest pain or dyspnea.  Home Medications  Prior to Admission medications   Medication Sig Start Date End Date Taking? Authorizing Provider  aspirin 81 MG tablet Take 81 mg by mouth daily.     Yes Historical Provider, MD  b complex vitamins tablet Take 1 tablet by mouth daily.    Yes Historical Provider, MD  Calcium-Vitamin D 600-200 MG-UNIT per tablet Take 1 tablet by mouth 2 (two) times daily.     Yes Historical Provider, MD  hydroxypropyl methylcellulose (ISOPTO TEARS) 2.5 % ophthalmic solution Place 1 drop into both eyes at bedtime.     Yes Historical Provider, MD  metoprolol tartrate (LOPRESSOR) 25 MG tablet Take 1 tablets  by mouth 2 (two) times daily. For heart irregularity 02/18/12  Yes Ok Anis, NP  Multiple  Vitamins-Minerals (ICAPS PO) Take 2 tablets by mouth daily.    Yes Historical Provider, MD  Polyethylene Glycol 3350 POWD Take 1 Package by mouth daily. For constipation   Yes Historical Provider, MD  tolterodine (DETROL) 2 MG tablet  01/08/12  Yes Margaree Mackintosh, MD  vitamin E 100 UNIT capsule Take 100 Units by mouth daily.     Yes Historical Provider, MD   Review of Systems Palpitations as outlined in history of present illness.  No chest pain, sob, n, v, dizziness, syncope, edema, early satiety, dysuria, dark stools, blood in stools, diarrhea, rash/skin changes, fevers, chills, wt loss/gain.  Otherwise all systems reviewed and negative.  Physical Exam  Blood pressure 120/60, pulse 80, height 5\' 4"  (1.626 m), weight 127 lb (57.607 kg).  General: Pleasant, NAD Psych: Normal affect. Neuro: Alert and oriented X 3. Moves all extremities spontaneously. HEENT: Normal  Neck: Supple without bruits or JVD. Lungs:  Resp regular and unlabored, CTA. Heart: RRR no s3, s4.  2/6 systolic ejection murmur heard loudest at the right upper sternal border without heard throughout. Abdomen: Soft, non-tender, non-distended, BS + x 4.  Extremities: No clubbing, cyanosis or edema. DP/PT/Radials 2+ and equal bilaterally.  Accessory Clinical Findings  ECG - regular sinus rhythm, 67, left axis deviation, left atrial enlargement.  Assessment & Plan  1.  PSVT:  Patient with more frequent episodes  of palpitations.  We will titrate her metoprolol to 37.5 mg b.i.d.  She has followup scheduled with Dr. Berna Bue at the end of the month at which point we can evaluate the effectiveness of this therapy and determine if additional medication is required.    2.  Hypertension:  Stable.  3.  Aortic stenosis:  Mild by last echo in May of 2011.  4.  Disposition:  Follow up with Dr. Antoine Poche on April 30.  Nicolasa Ducking, NP 02/18/2012, 3:13 PM

## 2012-02-23 DIAGNOSIS — H5440 Blindness, one eye, unspecified eye: Secondary | ICD-10-CM | POA: Diagnosis not present

## 2012-02-23 DIAGNOSIS — H53489 Generalized contraction of visual field, unspecified eye: Secondary | ICD-10-CM | POA: Diagnosis not present

## 2012-02-29 DIAGNOSIS — H353290 Exudative age-related macular degeneration, unspecified eye, stage unspecified: Secondary | ICD-10-CM | POA: Diagnosis not present

## 2012-02-29 DIAGNOSIS — H4011X0 Primary open-angle glaucoma, stage unspecified: Secondary | ICD-10-CM | POA: Diagnosis not present

## 2012-02-29 DIAGNOSIS — H353190 Nonexudative age-related macular degeneration, unspecified eye, stage unspecified: Secondary | ICD-10-CM | POA: Diagnosis not present

## 2012-02-29 DIAGNOSIS — H31019 Macula scars of posterior pole (postinflammatory) (post-traumatic), unspecified eye: Secondary | ICD-10-CM | POA: Diagnosis not present

## 2012-03-01 ENCOUNTER — Telehealth: Payer: Self-pay | Admitting: Internal Medicine

## 2012-03-01 DIAGNOSIS — K529 Noninfective gastroenteritis and colitis, unspecified: Secondary | ICD-10-CM

## 2012-03-01 NOTE — Telephone Encounter (Signed)
Fax Rx for Phenergan 25 mg tablets #30 one po q 4 hours as needed for nausea to [employer] [facility].

## 2012-03-02 ENCOUNTER — Other Ambulatory Visit: Payer: Self-pay

## 2012-03-02 MED ORDER — PROMETHAZINE HCL 25 MG PO TABS: 25.0000 mg | ORAL_TABLET | ORAL | Status: DC | PRN

## 2012-03-07 DIAGNOSIS — H409 Unspecified glaucoma: Secondary | ICD-10-CM | POA: Diagnosis not present

## 2012-03-07 DIAGNOSIS — H4011X0 Primary open-angle glaucoma, stage unspecified: Secondary | ICD-10-CM | POA: Diagnosis not present

## 2012-03-08 ENCOUNTER — Ambulatory Visit (INDEPENDENT_AMBULATORY_CARE_PROVIDER_SITE_OTHER): Payer: Medicare Other | Admitting: Cardiology

## 2012-03-08 ENCOUNTER — Encounter: Payer: Self-pay | Admitting: Cardiology

## 2012-03-08 VITALS — BP 120/60 | HR 80 | Ht 64.0 in | Wt 126.8 lb

## 2012-03-08 DIAGNOSIS — I10 Essential (primary) hypertension: Secondary | ICD-10-CM

## 2012-03-08 DIAGNOSIS — Z79899 Other long term (current) drug therapy: Secondary | ICD-10-CM

## 2012-03-08 DIAGNOSIS — R002 Palpitations: Secondary | ICD-10-CM | POA: Diagnosis not present

## 2012-03-08 DIAGNOSIS — I359 Nonrheumatic aortic valve disorder, unspecified: Secondary | ICD-10-CM

## 2012-03-08 DIAGNOSIS — I498 Other specified cardiac arrhythmias: Secondary | ICD-10-CM

## 2012-03-08 DIAGNOSIS — I471 Supraventricular tachycardia: Secondary | ICD-10-CM

## 2012-03-08 LAB — HEPATIC FUNCTION PANEL
Bilirubin, Direct: 0.1 mg/dL (ref 0.0–0.3)
Total Bilirubin: 0.7 mg/dL (ref 0.3–1.2)

## 2012-03-08 LAB — BASIC METABOLIC PANEL
BUN: 20 mg/dL (ref 6–23)
Chloride: 103 mEq/L (ref 96–112)
Creatinine, Ser: 1.1 mg/dL (ref 0.4–1.2)
GFR: 50.32 mL/min — ABNORMAL LOW (ref >60.00)
Potassium: 4.7 mEq/L (ref 3.5–5.1)

## 2012-03-08 MED ORDER — METOPROLOL TARTRATE 25 MG PO TABS: ORAL_TABLET | ORAL | Status: DC

## 2012-03-08 MED ORDER — AMIODARONE HCL 200 MG PO TABS: 200.0000 mg | ORAL_TABLET | Freq: Two times a day (BID) | ORAL | Status: DC

## 2012-03-08 NOTE — Progress Notes (Signed)
HPI The patient presents for follow up of palpitations. On 10/14/11 with chest pain and palpitations. ECG on admission demonstrated what appears to be SVT with either a retrograde P wave or first degree AV block. She has since been seen back a couple of times for med titration.  Since her last she has continued to get palpitations and she's not sure it's a different than it was previously. She thinks it happens a few times a week. It does bother her it makes her feel uncomfortable. She doesn't get presyncope or syncope. She doesn't get chest pressure. She might have a little discomfort in her right arm. She might take an extra metoprolol. She'll try to lie down. Symptoms last for several minutes.  Allergies  Allergen Reactions  . Codeine Nausea Only    Current Outpatient Prescriptions  Medication Sig Dispense Refill  . aspirin 81 MG tablet Take 81 mg by mouth daily.        Marland Kitchen b complex vitamins tablet Take 1 tablet by mouth daily.       . Calcium-Vitamin D 600-200 MG-UNIT per tablet Take 1 tablet by mouth 2 (two) times daily.        . hydroxypropyl methylcellulose (ISOPTO TEARS) 2.5 % ophthalmic solution Place 1 drop into both eyes at bedtime.        . metoprolol tartrate (LOPRESSOR) 25 MG tablet Take 1.5 tablets (37.5 mg total) by mouth 2 (two) times daily. For heart irregularity  60 tablet  11  . Multiple Vitamins-Minerals (ICAPS PO) Take 2 tablets by mouth daily.       . Polyethylene Glycol 3350 POWD Take 1 Package by mouth daily. For constipation      . tolterodine (DETROL) 2 MG tablet       . vitamin E 100 UNIT capsule Take 100 Units by mouth daily.          Past Medical History  Diagnosis Date  . Glaucoma   . Anxiety     mild  . PVC (premature ventricular contraction)   . Osteoporosis   . History of recurrent UTIs   . Vision loss of left eye   . SVT (supraventricular tachycardia)   . Aortic stenosis     a. Echo 03/2010:  EF 70-75%, mild AS    Past Surgical History    Procedure Date  . Tonsillectomy   . Appendectomy   . Eye surgery     L for glaucoma    ROS:  As stated in the HPI and negative for all other systems.  PHYSICAL EXAM BP 120/60  Pulse 80  Ht 5\' 4"  (1.626 m)  Wt 126 lb 12.8 oz (57.516 kg)  BMI 21.77 kg/m2 GENERAL:  Well appearing, for age HEENT:  Pupils equal round and reactive, fundi not visualized, oral mucosa unremarkable NECK:  No jugular venous distention, waveform within normal limits, carotid upstroke brisk and symmetric, no bruits, no thyromegaly LYMPHATICS:  No cervical, inguinal adenopathy LUNGS:  Clear to auscultation bilaterally BACK:  No CVA tenderness,lordosis CHEST:  Unremarkable HEART:  PMI not displaced or sustained,S1 and S2 within normal limits, no S3, no S4, no clicks, no rubs, systolic murmur mid peaking ABD:  Flat, positive bowel sounds normal in frequency in pitch, no bruits, no rebound, no guarding, no midline pulsatile mass, no hepatomegaly, no splenomegaly EXT:  2 plus pulses throughout, no edema, no cyanosis no clubbing SKIN:  No rashes no nodules NEURO:  Cranial nerves II through XII grossly intact, motor grossly  intact throughout PSYCH:  Cognitively intact, oriented to person place and time  ASSESSMENT AND PLAN

## 2012-03-08 NOTE — Assessment & Plan Note (Signed)
The blood pressure is at target. No change in medications is indicated. We will continue with therapeutic lifestyle changes (TLC).  

## 2012-03-08 NOTE — Patient Instructions (Addendum)
Please decrease your Metoprolol to 25 mg twice a day Start Amiodarone 200 mg twice a day Continue all other medications as listed  Please have blood work today  Follow up with Dr Antoine Poche in 1 month

## 2012-03-08 NOTE — Assessment & Plan Note (Signed)
She has mild AS.  I will repeat an echo in the future.

## 2012-03-08 NOTE — Assessment & Plan Note (Addendum)
Since these continue to be problematic I will start a low dose of amiodarone. I will reduce her metoprolol. She'll get a complete metabolic profile today for baseline liver enzymes. Her thyroid was normal recently. I will see her back in one month for followup.  This likely represents atrial tachycardia with first degree AV block.

## 2012-03-09 ENCOUNTER — Telehealth: Payer: Self-pay

## 2012-03-09 NOTE — Telephone Encounter (Signed)
Called patient about lab results.

## 2012-03-10 ENCOUNTER — Telehealth: Payer: Self-pay

## 2012-03-10 NOTE — Telephone Encounter (Signed)
Call about lab results.

## 2012-03-10 NOTE — Telephone Encounter (Signed)
Ms lefevers was returning my call from 03-09-2012 about her lab results and about her next appointment

## 2012-03-10 NOTE — Telephone Encounter (Signed)
Called about her labs and next appointment

## 2012-03-16 DIAGNOSIS — H903 Sensorineural hearing loss, bilateral: Secondary | ICD-10-CM | POA: Diagnosis not present

## 2012-03-17 DIAGNOSIS — H5440 Blindness, one eye, unspecified eye: Secondary | ICD-10-CM | POA: Diagnosis not present

## 2012-03-21 ENCOUNTER — Other Ambulatory Visit: Payer: Self-pay | Admitting: Internal Medicine

## 2012-03-21 ENCOUNTER — Encounter: Payer: Self-pay | Admitting: Internal Medicine

## 2012-03-21 ENCOUNTER — Ambulatory Visit
Admission: RE | Admit: 2012-03-21 | Discharge: 2012-03-21 | Disposition: A | Discharge status: Home / Self Care | Payer: Medicare Other | Source: Ambulatory Visit | Attending: Internal Medicine | Admitting: Internal Medicine

## 2012-03-21 ENCOUNTER — Ambulatory Visit (INDEPENDENT_AMBULATORY_CARE_PROVIDER_SITE_OTHER): Payer: Medicare Other | Admitting: Internal Medicine

## 2012-03-21 VITALS — BP 138/66 | HR 76 | Temp 98.6°F | Wt 124.0 lb

## 2012-03-21 DIAGNOSIS — R079 Chest pain, unspecified: Secondary | ICD-10-CM | POA: Diagnosis not present

## 2012-03-21 DIAGNOSIS — R109 Unspecified abdominal pain: Secondary | ICD-10-CM | POA: Diagnosis not present

## 2012-03-21 DIAGNOSIS — R0989 Other specified symptoms and signs involving the circulatory and respiratory systems: Secondary | ICD-10-CM | POA: Diagnosis not present

## 2012-03-21 DIAGNOSIS — K5900 Constipation, unspecified: Secondary | ICD-10-CM

## 2012-03-21 DIAGNOSIS — R630 Anorexia: Secondary | ICD-10-CM | POA: Diagnosis not present

## 2012-03-21 DIAGNOSIS — R141 Gas pain: Secondary | ICD-10-CM | POA: Diagnosis not present

## 2012-03-21 DIAGNOSIS — R142 Eructation: Secondary | ICD-10-CM | POA: Diagnosis not present

## 2012-03-21 LAB — COMPREHENSIVE METABOLIC PANEL
ALT: 12 U/L (ref 0–35)
AST: 22 U/L (ref 0–37)
Chloride: 105 mEq/L (ref 96–112)
Creat: 1.26 mg/dL — ABNORMAL HIGH (ref 0.50–1.10)
Sodium: 138 mEq/L (ref 135–145)
Total Bilirubin: 0.6 mg/dL (ref 0.3–1.2)
Total Protein: 6 g/dL (ref 6.0–8.3)

## 2012-03-21 LAB — CBC WITH DIFFERENTIAL/PLATELET
Basophils Absolute: 0 10*3/uL (ref 0.0–0.1)
Eosinophils Absolute: 0.1 10*3/uL (ref 0.0–0.7)
Eosinophils Relative: 3 % (ref 0–5)
Lymphocytes Relative: 28 % (ref 12–46)
MCV: 91.2 fL (ref 78.0–100.0)
Neutrophils Relative %: 60 % (ref 43–77)
Platelets: 248 10*3/uL (ref 150–400)
RBC: 4.34 MIL/uL (ref 3.87–5.11)
RDW: 14 % (ref 11.5–15.5)
WBC: 4 10*3/uL (ref 4.0–10.5)

## 2012-03-21 NOTE — Progress Notes (Signed)
  Subjective:    Patient ID: Brittany Warren, female    DOB: 1917-05-23, 78 y.o.   MRN: [id number]  HPI 78 year old white female with long-standing history of constipation. Takes MiraLAX on a daily basis. Recently bowels have not worked quite as well and she been alternating MiraLAX with milk of magnesia which also doesn't work very well either. Feels like she has abdominal bloating and discomfort. Hasn't wanted to eat today. Just not feeling well. No shortness of breath. No chest pain. Had a little bit of diarrhea earlier today. Generally has about one bowel movement daily but more recently has noted some constipation.    Review of Systems     Objective:   Physical Exam chest: rales both lobes bilaterally lower lung zones; abdomen: slightly distended bowel sounds are increased, no hepatosplenomegaly or masses but some mild tenderness without rebound; extremities without edema; rectal exam no impaction        Assessment & Plan:  Constipation  Rales in the lungs-rule out congestive heart failure  Plan: Patient will be sent for KUB flat and upright film. She will also had chest x-ray PA and lateral.  Addendum: Nose small bowel obstruction noted on KUB. Chest x-ray shows no evidence of congestive heart failure. Patient will use Dulcolax suppository once then take MiraLAX on a daily basis. Call if symptoms persist.

## 2012-04-10 NOTE — Patient Instructions (Signed)
Use Dulcolax suppository for relief of constipation. Then begin MiraLAX on a daily basis. Call if symptoms persist.

## 2012-04-19 ENCOUNTER — Ambulatory Visit (INDEPENDENT_AMBULATORY_CARE_PROVIDER_SITE_OTHER): Payer: Medicare Other | Admitting: Cardiology

## 2012-04-19 ENCOUNTER — Encounter: Payer: Self-pay | Admitting: Cardiology

## 2012-04-19 VITALS — BP 145/69 | HR 88 | Ht 64.0 in | Wt 124.0 lb

## 2012-04-19 DIAGNOSIS — I498 Other specified cardiac arrhythmias: Secondary | ICD-10-CM | POA: Diagnosis not present

## 2012-04-19 DIAGNOSIS — I350 Nonrheumatic aortic (valve) stenosis: Secondary | ICD-10-CM

## 2012-04-19 DIAGNOSIS — I359 Nonrheumatic aortic valve disorder, unspecified: Secondary | ICD-10-CM

## 2012-04-19 DIAGNOSIS — I10 Essential (primary) hypertension: Secondary | ICD-10-CM | POA: Diagnosis not present

## 2012-04-19 DIAGNOSIS — I471 Supraventricular tachycardia, unspecified: Secondary | ICD-10-CM

## 2012-04-19 NOTE — Patient Instructions (Signed)
The current medical regimen is effective;  continue present plan and medications.  Follow up in 1 year with Dr Hochrein.  You will receive a letter in the mail 2 months before you are due.  Please call us when you receive this letter to schedule your follow up appointment.  

## 2012-04-19 NOTE — Progress Notes (Signed)
   HPI The patient presents for follow up of palpitations. At the last appointment she was complaining of these more and so I started her on amiodarone. She does this for about 3 weeks but did not feel well. She stopped this on her own. Her palpitations have actually not returned. She's not noticing them now. She's not having any presyncope or syncope. She denies any chest pressure, neck or arm discomfort. She's not having any shortness of breath, PND or orthopnea.  Allergies  Allergen Reactions  . Codeine Nausea Only    Current Outpatient Prescriptions  Medication Sig Dispense Refill  . aspirin 81 MG tablet Take 81 mg by mouth daily.        Marland Kitchen b complex vitamins tablet Take 1 tablet by mouth daily.       . Calcium-Vitamin D 600-200 MG-UNIT per tablet Take 1 tablet by mouth 2 (two) times daily.        . hydroxypropyl methylcellulose (ISOPTO TEARS) 2.5 % ophthalmic solution Place 1 drop into both eyes at bedtime.        . metoprolol tartrate (LOPRESSOR) 25 MG tablet For heart irregularity  60 tablet  11  . Multiple Vitamins-Minerals (ICAPS PO) Take 2 tablets by mouth daily.       . Polyethylene Glycol 3350 POWD Take 1 Package by mouth daily. For constipation as needed       . tolterodine (DETROL) 2 MG tablet       . vitamin E 100 UNIT capsule Take 100 Units by mouth daily.          Past Medical History  Diagnosis Date  . Glaucoma   . Anxiety     mild  . PVC (premature ventricular contraction)   . Osteoporosis   . History of recurrent UTIs   . Vision loss of left eye   . SVT (supraventricular tachycardia)   . Aortic stenosis     a. Echo 03/2010:  EF 70-75%, mild AS    Past Surgical History  Procedure Date  . Tonsillectomy   . Appendectomy   . Eye surgery     L for glaucoma    ROS:  As stated in the HPI and negative for all other systems.  PHYSICAL EXAM BP 145/69  Pulse 88  Ht 5\' 4"  (1.626 m)  Wt 124 lb (56.246 kg)  BMI 21.28 kg/m2 GENERAL:  Well appearing, for  age HEENT:  Pupils equal round and reactive, fundi not visualized, oral mucosa unremarkable NECK:  No jugular venous distention, waveform within normal limits, carotid upstroke brisk and symmetric, no bruits, no thyromegaly LUNGS:  Clear to auscultation bilaterally BACK:  No CVA tenderness,lordosis CHEST:  Unremarkable HEART:  PMI not displaced or sustained,S1 and S2 within normal limits, no S3, no S4, no clicks, no rubs, systolic murmur mid peaking ABD:  Flat, positive bowel sounds normal in frequency in pitch, no bruits, no rebound, no guarding, no midline pulsatile mass, no hepatomegaly, no splenomegaly EXT:  2 plus pulses throughout, no edema, no cyanosis no clubbing   ASSESSMENT AND PLAN

## 2012-04-19 NOTE — Assessment & Plan Note (Signed)
BP Readings from Last 3 Encounters:  04/19/12 145/69  03/21/12 138/66  03/08/12 120/60   today's blood pressure is unusually high and no change in therapy is indicated.

## 2012-04-19 NOTE — Assessment & Plan Note (Signed)
This is mild and will be followed up with clinical exam in the future.

## 2012-04-19 NOTE — Assessment & Plan Note (Signed)
She's doing well on his current therapy. She has more palpitations we go back up on the dose of beta blocker. She will remain off of the amiodarone.

## 2012-06-20 IMAGING — CR DG ABDOMEN 2V
2 series · 2 of 2 positions shown · non-contrast
Comparison: Lumbar spine films of 12/16/2006 and MR lumbar spine of
12/22/2006

CLINICAL DATA: Abdominal pain and bloating, loss of appetite

ABDOMEN - 2 VIEW

[view not recorded (1 of 2)]
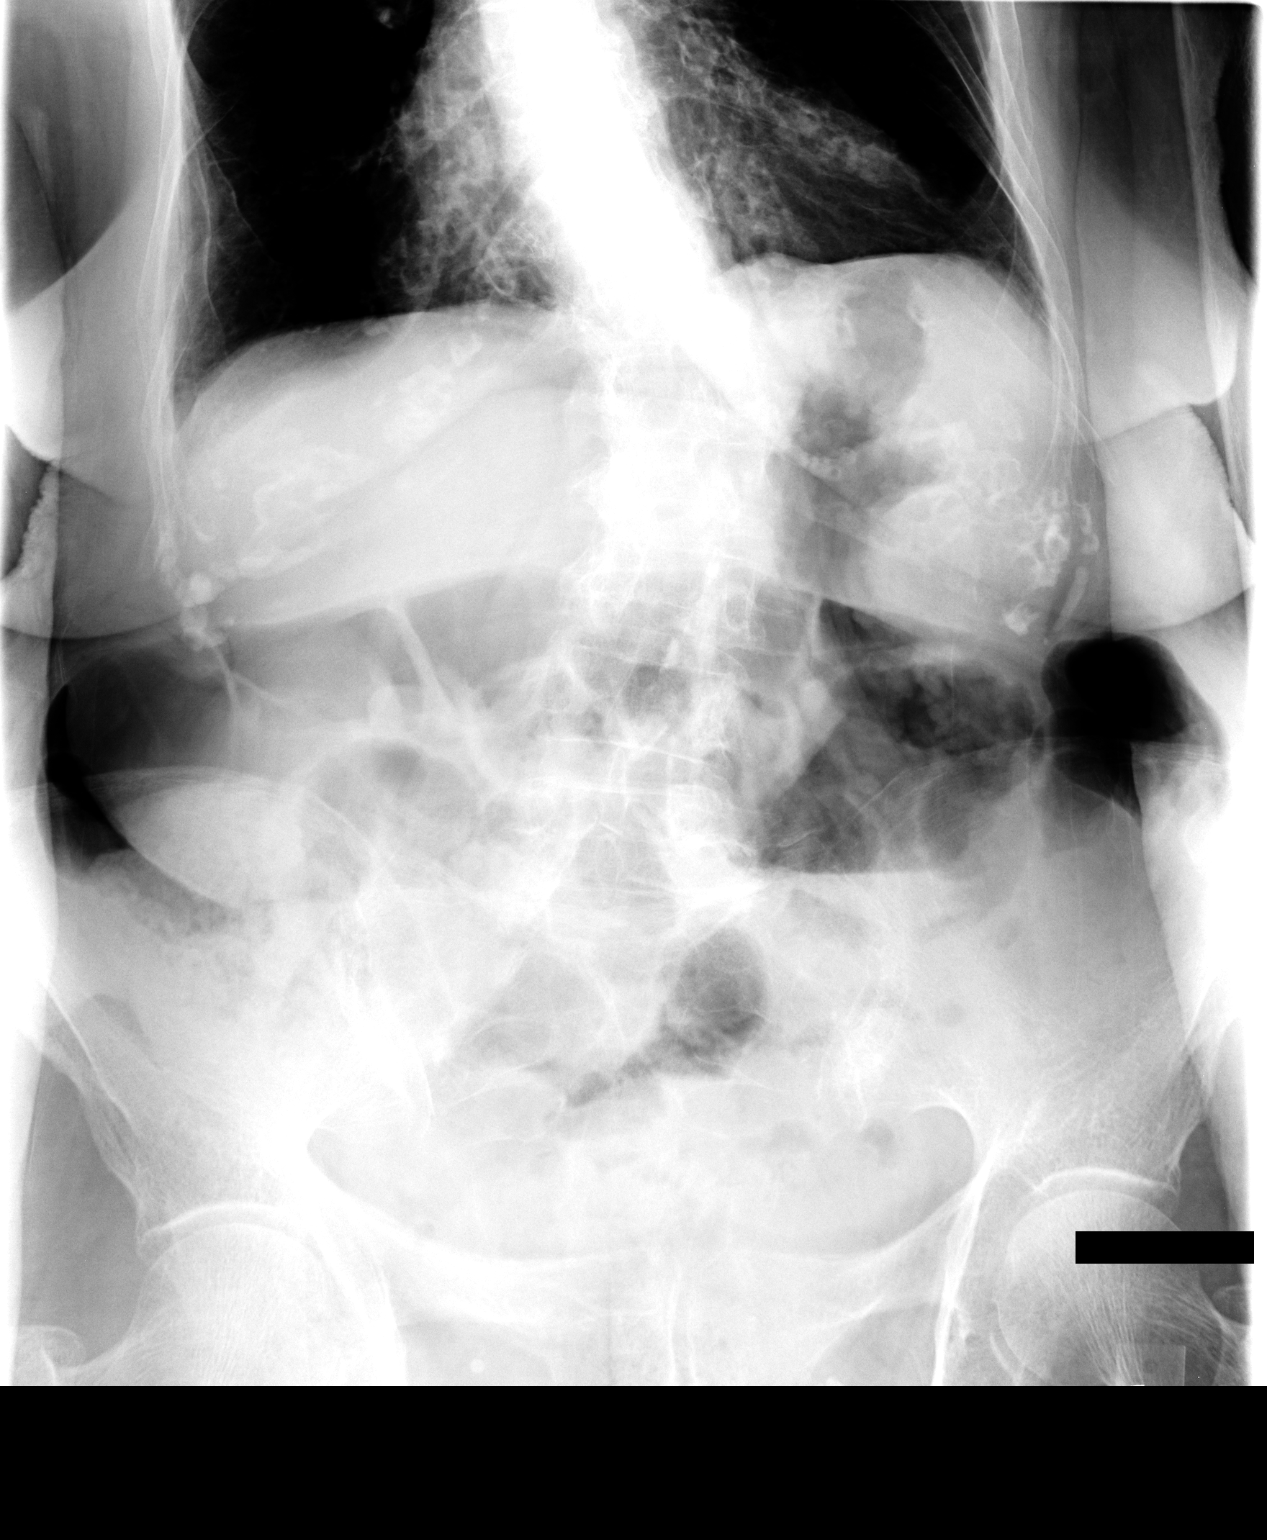

[view not recorded (2 of 2)]
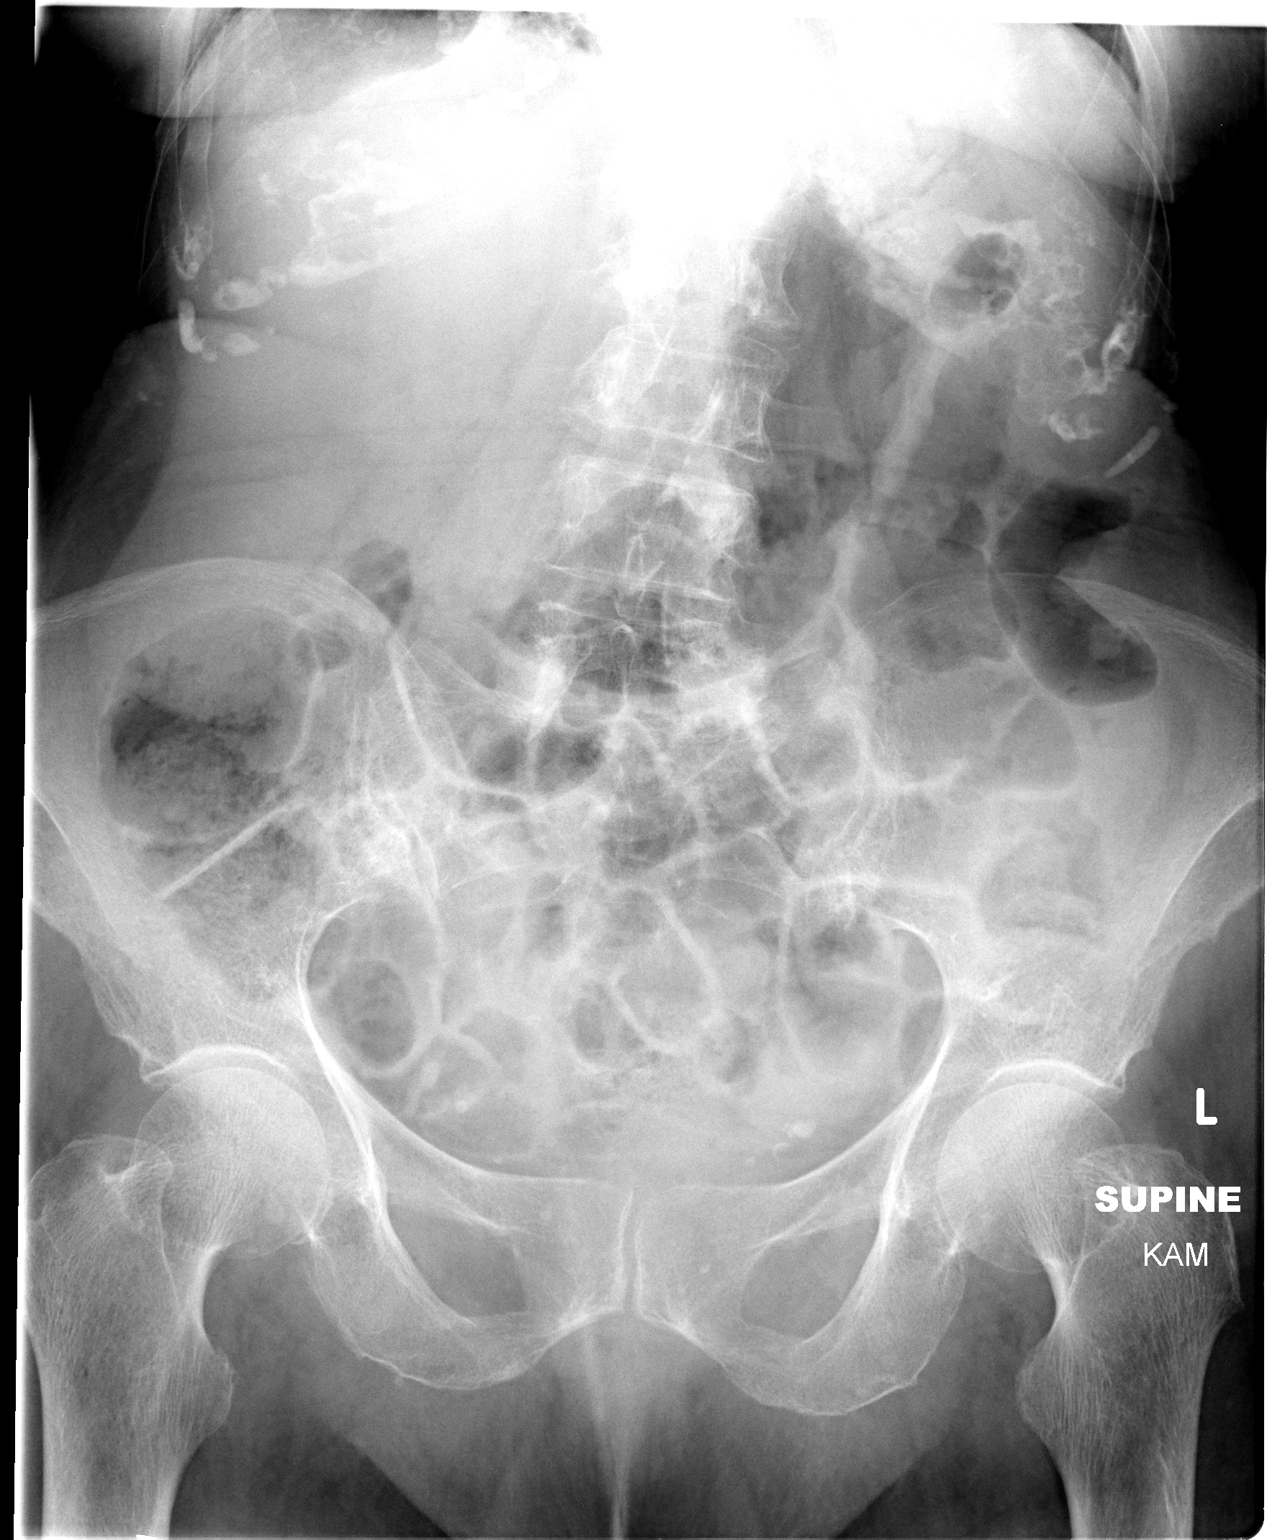

[2 of 2 positions shown; findings below may reference images not displayed]

FINDINGS: Lumbar scoliosis convex to the left has increased
slightly in the interval.  The bones are diffusely osteopenic.  No
bowel obstruction is seen.  No free air is noted.  No opaque
calculi are seen.
IMPRESSION: 1.  No bowel obstruction.  No free air.
2.  Some increase in lumbar scoliosis convex to the left.

## 2012-06-20 IMAGING — CR DG CHEST 2V
2 series · 2 of 2 positions shown · non-contrast
Comparison: 10/15/2011; 09/12/2010; chest CT - 03/24/2011

CLINICAL DATA: Rales in chest, abdominal pain, constipation

CHEST - 2 VIEW

[view not recorded (1 of 2)]
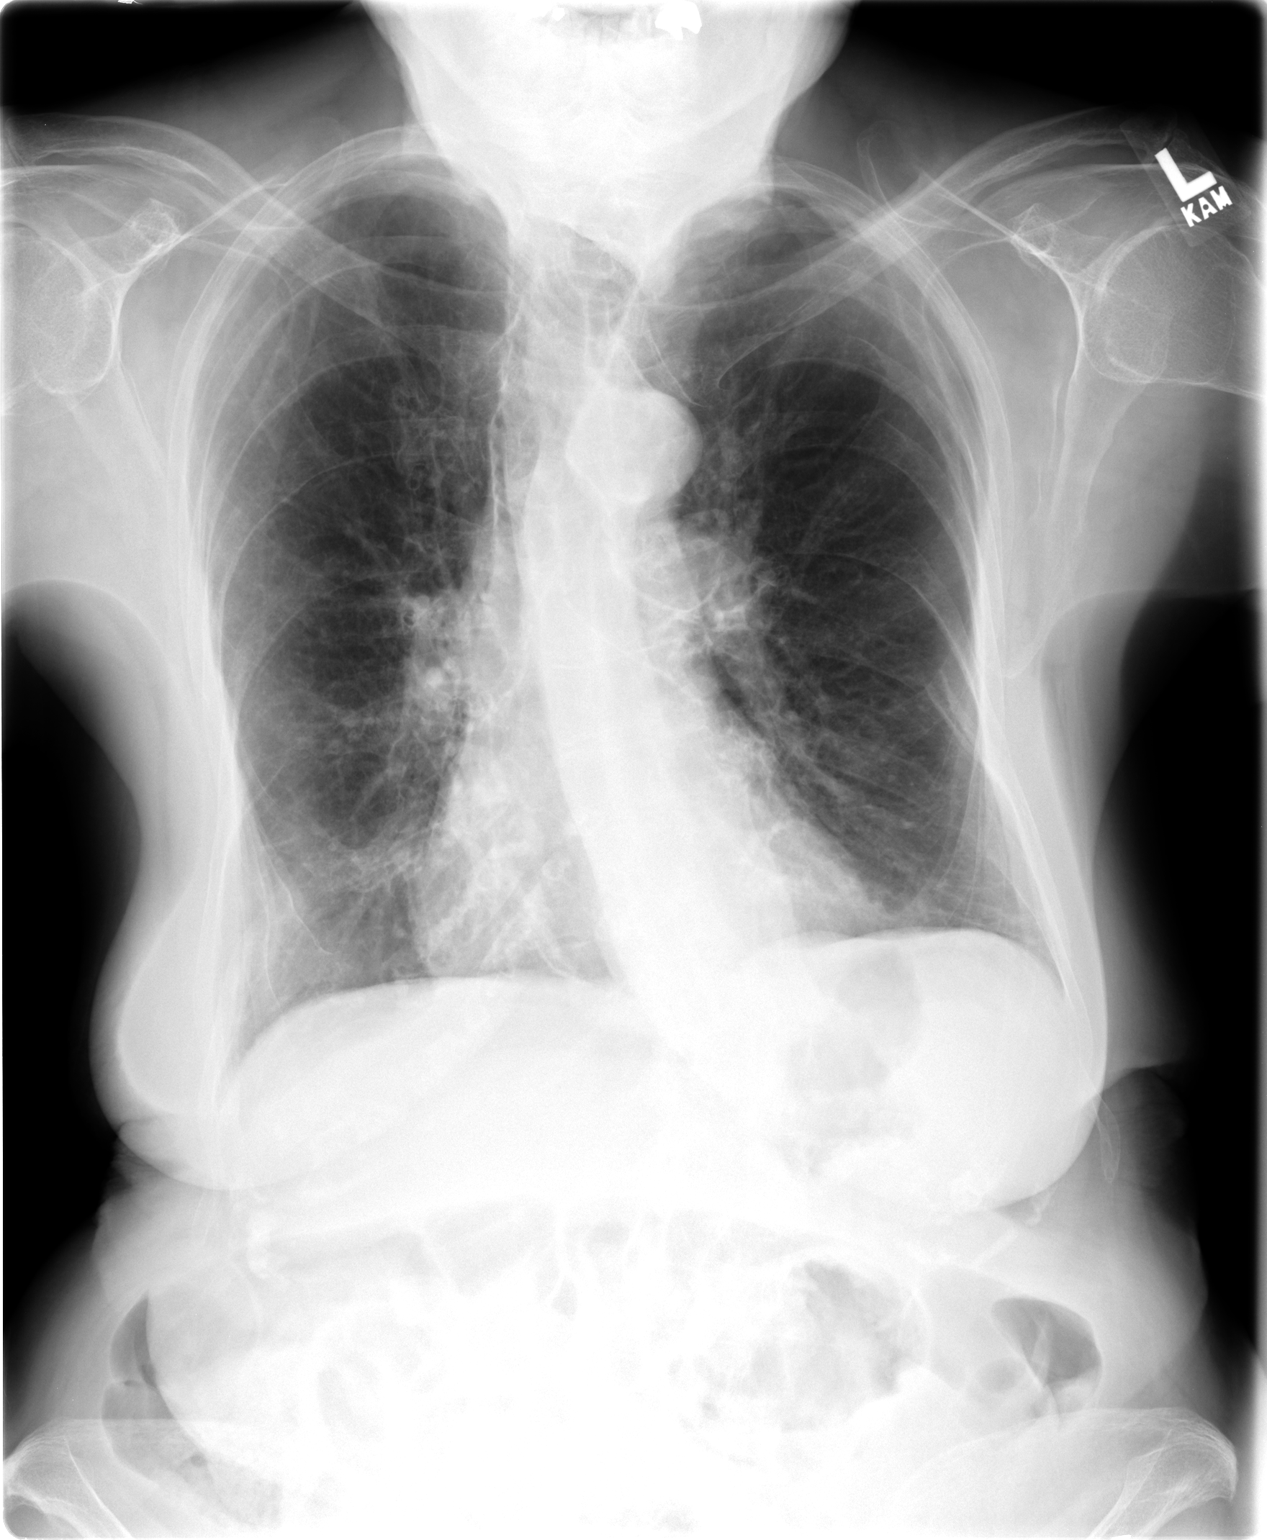

[view not recorded (2 of 2)]
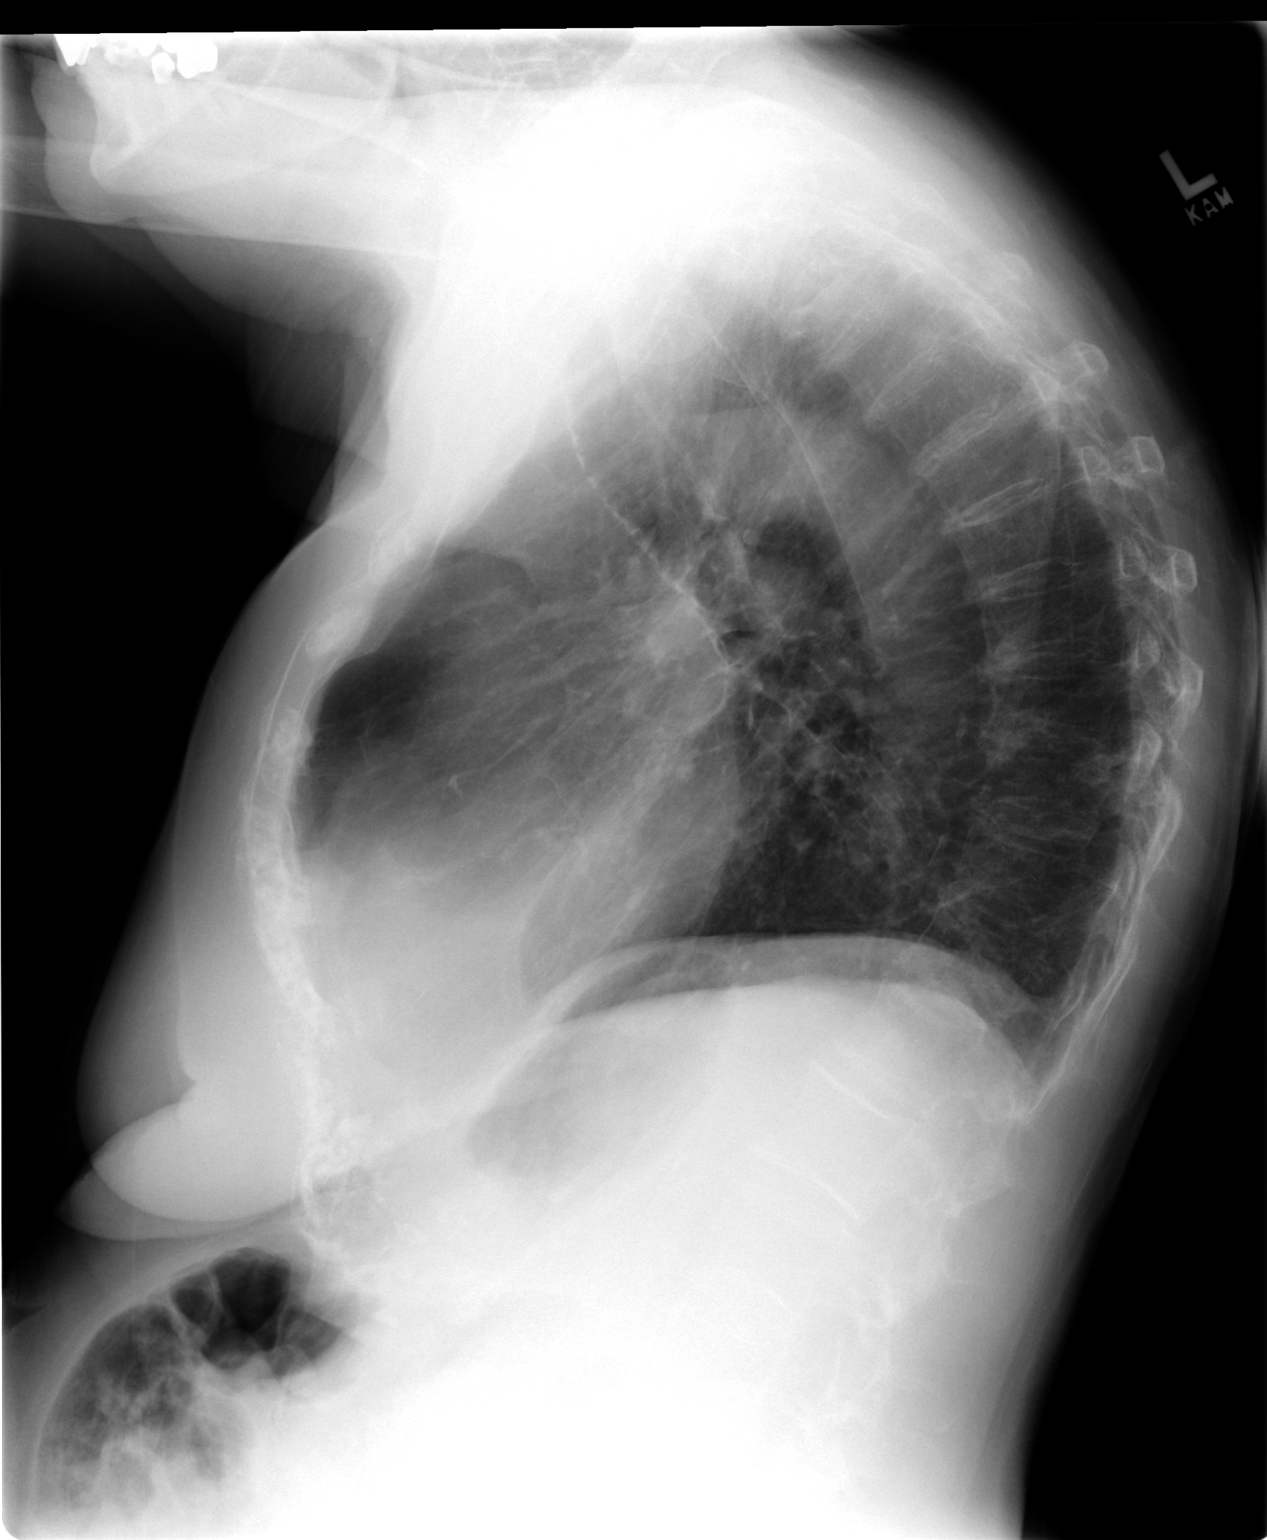

[2 of 2 positions shown; findings below may reference images not displayed]

FINDINGS: Grossly unchanged cardiac silhouette and mediastinal
contours.  The lungs remain hyperinflated.  No focal airspace
opacities.  No pleural effusion or pneumothorax.  Redemonstrated
accentuated thoracic kyphosis and mild scoliotic curvature of the
thoracolumbar spine.  Grossly unchanged deformity of the lateral
aspect of several ribs bilaterally.
IMPRESSION: No acute cardiopulmonary disease.

## 2012-07-07 ENCOUNTER — Telehealth: Payer: Self-pay | Admitting: Cardiology

## 2012-07-07 DIAGNOSIS — R002 Palpitations: Secondary | ICD-10-CM

## 2012-07-07 DIAGNOSIS — I359 Nonrheumatic aortic valve disorder, unspecified: Secondary | ICD-10-CM

## 2012-07-07 DIAGNOSIS — I10 Essential (primary) hypertension: Secondary | ICD-10-CM

## 2012-07-07 MED ORDER — METOPROLOL TARTRATE 25 MG PO TABS: 25.0000 mg | ORAL_TABLET | Freq: Three times a day (TID) | ORAL | Status: DC

## 2012-07-07 NOTE — Telephone Encounter (Signed)
Pt reports having increased palpitations.  Per Dr Hochrein's last office visit note if this occurred he would go back up on her beta blocker.  Will forward to him to review and for orders.

## 2012-07-07 NOTE — Telephone Encounter (Signed)
Pt would like to come in to be seen due to having her heart out of rhythm and she was not able to sleep well and she is not feeling well. No chest pain but right arm pain and nausea

## 2012-07-07 NOTE — Telephone Encounter (Signed)
Brittany Warren states she is taking Metoprolol bid.  She woke up during the night with a rapid irregular heart beat and pain in her right arm.  She drank some milk, ate some bread and took an extra metoprolol.  She states today she just doesn't feel quite right.  She did not take her am metoprolol.  She states her heart has been irregular off and on all morning.  She states she has been unable to find her pulse.  She does not have a bp monitor.

## 2012-07-07 NOTE — Telephone Encounter (Signed)
Pt aware to increase metoprolol 25 to TID  Verbal order per Dr Antoine Poche.  She will call back if no improvement

## 2012-07-13 DIAGNOSIS — H35369 Drusen (degenerative) of macula, unspecified eye: Secondary | ICD-10-CM | POA: Diagnosis not present

## 2012-07-13 DIAGNOSIS — H04129 Dry eye syndrome of unspecified lacrimal gland: Secondary | ICD-10-CM | POA: Diagnosis not present

## 2012-07-13 DIAGNOSIS — H26499 Other secondary cataract, unspecified eye: Secondary | ICD-10-CM | POA: Diagnosis not present

## 2012-07-13 DIAGNOSIS — Z961 Presence of intraocular lens: Secondary | ICD-10-CM | POA: Diagnosis not present

## 2012-07-13 DIAGNOSIS — H4011X0 Primary open-angle glaucoma, stage unspecified: Secondary | ICD-10-CM | POA: Diagnosis not present

## 2012-07-13 DIAGNOSIS — H353190 Nonexudative age-related macular degeneration, unspecified eye, stage unspecified: Secondary | ICD-10-CM | POA: Diagnosis not present

## 2012-07-25 DIAGNOSIS — H4011X0 Primary open-angle glaucoma, stage unspecified: Secondary | ICD-10-CM | POA: Diagnosis not present

## 2012-07-25 DIAGNOSIS — H409 Unspecified glaucoma: Secondary | ICD-10-CM | POA: Diagnosis not present

## 2012-07-25 DIAGNOSIS — H3530 Unspecified macular degeneration: Secondary | ICD-10-CM | POA: Diagnosis not present

## 2012-08-09 DIAGNOSIS — H353290 Exudative age-related macular degeneration, unspecified eye, stage unspecified: Secondary | ICD-10-CM | POA: Diagnosis not present

## 2012-08-09 DIAGNOSIS — H31019 Macula scars of posterior pole (postinflammatory) (post-traumatic), unspecified eye: Secondary | ICD-10-CM | POA: Diagnosis not present

## 2012-08-09 DIAGNOSIS — H353190 Nonexudative age-related macular degeneration, unspecified eye, stage unspecified: Secondary | ICD-10-CM | POA: Diagnosis not present

## 2012-08-09 DIAGNOSIS — H348392 Tributary (branch) retinal vein occlusion, unspecified eye, stable: Secondary | ICD-10-CM | POA: Diagnosis not present

## 2012-08-11 ENCOUNTER — Telehealth: Payer: Self-pay | Admitting: Cardiology

## 2012-08-11 DIAGNOSIS — I10 Essential (primary) hypertension: Secondary | ICD-10-CM

## 2012-08-11 DIAGNOSIS — R002 Palpitations: Secondary | ICD-10-CM

## 2012-08-11 DIAGNOSIS — I359 Nonrheumatic aortic valve disorder, unspecified: Secondary | ICD-10-CM

## 2012-08-11 MED ORDER — METOPROLOL TARTRATE 25 MG PO TABS: 25.0000 mg | ORAL_TABLET | Freq: Every day | ORAL | Status: DC

## 2012-08-11 NOTE — Telephone Encounter (Signed)
Per pt call - states she was never told to take 3 tablets daily of Metoprolol and doesn't understand where that is coming from.  Explained to pt - recorded in her chart on 07/07/2012 she called here with the complaint of having increase in palpitations and rapid heart beat.  She was instructed at that time to increase to 3 tablets daily as she reported that she had been taking two daily.  Pt very addiment she was never told this and that written instruction should have been mailed to her home if there was a change.  Explained to pt that generally we do not send written instruction when the pt states understanding of the change and a new RX is sent into the pharmacy.  I requested her to ask in the future if she wants written instructions and we will be glad to mail them to her.  In the end the pt states she has only been taking metoprolol 25 mg once a day and taking an extra 1/2 to 1 tablet as needed for palpitations.  Pt was instructed to continue on this dosing.  She would like a new RX sent into her pharmacy to reflect this.

## 2012-08-11 NOTE — Telephone Encounter (Signed)
plz return call to patient [phone] regarding medication.    Pt was told by pharmacist, the dosage has been increased.  Pt would like to discuss this with nurse

## 2012-09-06 DIAGNOSIS — M545 Low back pain: Secondary | ICD-10-CM | POA: Diagnosis not present

## 2012-09-06 DIAGNOSIS — M25559 Pain in unspecified hip: Secondary | ICD-10-CM | POA: Diagnosis not present

## 2012-09-06 DIAGNOSIS — M76899 Other specified enthesopathies of unspecified lower limb, excluding foot: Secondary | ICD-10-CM | POA: Diagnosis not present

## 2012-09-13 ENCOUNTER — Encounter: Payer: Self-pay | Admitting: Internal Medicine

## 2012-09-13 ENCOUNTER — Ambulatory Visit (INDEPENDENT_AMBULATORY_CARE_PROVIDER_SITE_OTHER): Payer: Medicare Other | Admitting: Internal Medicine

## 2012-09-13 VITALS — BP 138/60 | HR 72 | Temp 98.6°F | Ht 60.75 in | Wt 124.0 lb

## 2012-09-13 DIAGNOSIS — Z0000 Encounter for general adult medical examination without abnormal findings: Secondary | ICD-10-CM

## 2012-09-13 DIAGNOSIS — M810 Age-related osteoporosis without current pathological fracture: Secondary | ICD-10-CM

## 2012-09-13 DIAGNOSIS — I10 Essential (primary) hypertension: Secondary | ICD-10-CM | POA: Diagnosis not present

## 2012-09-13 DIAGNOSIS — I359 Nonrheumatic aortic valve disorder, unspecified: Secondary | ICD-10-CM

## 2012-09-13 DIAGNOSIS — R002 Palpitations: Secondary | ICD-10-CM

## 2012-09-13 DIAGNOSIS — H409 Unspecified glaucoma: Secondary | ICD-10-CM | POA: Diagnosis not present

## 2012-09-13 DIAGNOSIS — Z23 Encounter for immunization: Secondary | ICD-10-CM | POA: Diagnosis not present

## 2012-09-13 DIAGNOSIS — I350 Nonrheumatic aortic (valve) stenosis: Secondary | ICD-10-CM

## 2012-09-13 LAB — POCT URINALYSIS DIPSTICK
Bilirubin, UA: NEGATIVE
Blood, UA: NEGATIVE
Glucose, UA: NEGATIVE
Spec Grav, UA: 1.015
pH, UA: 6
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
## 2012-09-29 ENCOUNTER — Encounter: Payer: Self-pay | Admitting: Internal Medicine

## 2012-09-29 ENCOUNTER — Telehealth: Payer: Self-pay

## 2012-09-29 ENCOUNTER — Emergency Department (HOSPITAL_COMMUNITY)
Admission: EM | Admit: 2012-09-29 | Discharge: 2012-09-29 | Disposition: A | Discharge status: Home / Self Care | Payer: Medicare Other | Attending: Emergency Medicine | Admitting: Emergency Medicine

## 2012-09-29 ENCOUNTER — Emergency Department (HOSPITAL_COMMUNITY): Payer: Medicare Other

## 2012-09-29 DIAGNOSIS — Z7982 Long term (current) use of aspirin: Secondary | ICD-10-CM | POA: Diagnosis not present

## 2012-09-29 DIAGNOSIS — R109 Unspecified abdominal pain: Secondary | ICD-10-CM | POA: Diagnosis not present

## 2012-09-29 DIAGNOSIS — Z79899 Other long term (current) drug therapy: Secondary | ICD-10-CM | POA: Diagnosis not present

## 2012-09-29 DIAGNOSIS — Y9389 Activity, other specified: Secondary | ICD-10-CM | POA: Insufficient documentation

## 2012-09-29 DIAGNOSIS — M810 Age-related osteoporosis without current pathological fracture: Secondary | ICD-10-CM | POA: Insufficient documentation

## 2012-09-29 DIAGNOSIS — Z87891 Personal history of nicotine dependence: Secondary | ICD-10-CM | POA: Diagnosis not present

## 2012-09-29 DIAGNOSIS — S329XXA Fracture of unspecified parts of lumbosacral spine and pelvis, initial encounter for closed fracture: Secondary | ICD-10-CM

## 2012-09-29 DIAGNOSIS — I4949 Other premature depolarization: Secondary | ICD-10-CM | POA: Insufficient documentation

## 2012-09-29 DIAGNOSIS — R296 Repeated falls: Secondary | ICD-10-CM | POA: Insufficient documentation

## 2012-09-29 DIAGNOSIS — I498 Other specified cardiac arrhythmias: Secondary | ICD-10-CM | POA: Diagnosis not present

## 2012-09-29 DIAGNOSIS — IMO0002 Reserved for concepts with insufficient information to code with codable children: Secondary | ICD-10-CM | POA: Diagnosis not present

## 2012-09-29 DIAGNOSIS — Z8659 Personal history of other mental and behavioral disorders: Secondary | ICD-10-CM | POA: Diagnosis not present

## 2012-09-29 DIAGNOSIS — Y929 Unspecified place or not applicable: Secondary | ICD-10-CM | POA: Insufficient documentation

## 2012-09-29 DIAGNOSIS — N949 Unspecified condition associated with female genital organs and menstrual cycle: Secondary | ICD-10-CM | POA: Diagnosis not present

## 2012-09-29 DIAGNOSIS — Z87440 Personal history of urinary (tract) infections: Secondary | ICD-10-CM | POA: Diagnosis not present

## 2012-09-29 DIAGNOSIS — Z8669 Personal history of other diseases of the nervous system and sense organs: Secondary | ICD-10-CM | POA: Insufficient documentation

## 2012-09-29 DIAGNOSIS — S32509A Unspecified fracture of unspecified pubis, initial encounter for closed fracture: Secondary | ICD-10-CM | POA: Diagnosis not present

## 2012-09-29 DIAGNOSIS — I359 Nonrheumatic aortic valve disorder, unspecified: Secondary | ICD-10-CM | POA: Diagnosis not present

## 2012-09-29 DIAGNOSIS — S32599A Other specified fracture of unspecified pubis, initial encounter for closed fracture: Secondary | ICD-10-CM

## 2012-09-29 DIAGNOSIS — H5460 Unqualified visual loss, one eye, unspecified: Secondary | ICD-10-CM | POA: Diagnosis not present

## 2012-09-29 HISTORY — DX: Fracture of unspecified parts of lumbosacral spine and pelvis, initial encounter for closed fracture: S32.9XXA

## 2012-09-29 MED ORDER — METOPROLOL TARTRATE 25 MG PO TABS
25.0000 mg | ORAL_TABLET | Freq: Once | ORAL | Status: AC
  Administered 2012-09-29: 25 mg via ORAL
  Filled 2012-09-29: qty 1

## 2012-09-29 MED ORDER — DOCUSATE SODIUM 100 MG PO CAPS: 100.0000 mg | ORAL_CAPSULE | Freq: Two times a day (BID) | ORAL | Status: DC

## 2012-09-29 MED ORDER — OXYCODONE-ACETAMINOPHEN 5-325 MG PO TABS
1.0000 | ORAL_TABLET | Freq: Once | ORAL | Status: AC
  Administered 2012-09-29: 1 via ORAL
  Filled 2012-09-29: qty 1

## 2012-09-29 MED ORDER — OXYCODONE-ACETAMINOPHEN 5-325 MG PO TABS: 1.0000 | ORAL_TABLET | ORAL | Status: DC | PRN

## 2012-09-29 NOTE — Telephone Encounter (Signed)
Pt needs to be taken to ED for evaluation

## 2012-09-29 NOTE — ED Notes (Signed)
Patient transported to X-ray 

## 2012-09-29 NOTE — ED Notes (Signed)
Pt fell last night in Independent living facility. Was able to stand and pivot this AM. Complains of bilateral groin pain and left hip pain on palpation and movement.

## 2012-09-29 NOTE — ED Notes (Signed)
Bed:WA24<BR> Expected date:<BR> Expected time:<BR> Means of arrival:<BR> Comments:<BR> ems

## 2012-09-29 NOTE — Progress Notes (Signed)
[facility] ED CM consulted by EDP, Kohut about d/c plans to get pt to assisted living or snf level of [facility]. CM spoke with pt at 1315 who's choice is not to go to [facility] (snf) but to return to her apartment with private duty services or to assisted living level at [facility].  Pt reports having pain At 1330 [facility] ED CM called [facility] and spoke with Nedra Hai [phone]), Snf SW, about pt choice of disposition. Nedra Hai confirms pt can be returned to [facility] with FL2, and Rolling walker with seat if possible.  Nedra Hai states pt can 1340 ED Cm spoke with Toma Copier (answered ED SW phone [phone]) about getting FL2 for pt and showed her in epic pt medication list in EDP progress note.  At 1345 Cm updated EDP, Kohut At 10 Cm spoke with [facility] DME coordinator about rolling walker with seat as recommended by Nedra Hai at [facility] Order corrected Pending FL2. ED RN and Pt updated

## 2012-09-29 NOTE — ED Notes (Signed)
MD at bedside. 

## 2012-09-29 NOTE — ED Notes (Signed)
Pt came in from outside last night to her room and put her coat in the closet when she turned around she lost her balance and fell.

## 2012-09-29 NOTE — ED Provider Notes (Signed)
History    78 year old female with left hip and groin pain. Patient had a mechanical fall last night. She was putting away her coat and when she went to turn around, she lost her balance and fell. She fell down onto her butt then onto her left side. Does not think she hit her head. Denies any significant pain anywhere else. Denies use of blood thinning medication aside from baby aspirin daily. No numbness or tingling. Has been able to bear weight on her lower extremities, although with a significant increase in pain.  CSN: [id number]  Arrival date & time 09/29/12  1026   First MD Initiated Contact with Patient 09/29/12 1035      Chief Complaint  Patient presents with  . Hip Pain    (Consider location/radiation/quality/duration/timing/severity/associated sxs/prior treatment) HPI  Past Medical History  Diagnosis Date  . Glaucoma(365)   . Anxiety     mild  . PVC (premature ventricular contraction)   . Osteoporosis   . History of recurrent UTIs   . Vision loss of left eye   . SVT (supraventricular tachycardia)   . Aortic stenosis     a. Echo 03/2010:  EF 70-75%, mild AS    Past Surgical History  Procedure Date  . Tonsillectomy   . Appendectomy   . Eye surgery     L for glaucoma    No family history on file.  History  Substance Use Topics  . Smoking status: Former Smoker -- 0.5 packs/day for 30 years    Types: Cigarettes    Quit date: 01/14/1984  . Smokeless tobacco: Never Used  . Alcohol Use: Yes     Comment: wine socially    OB History    Grav Para Term Preterm Abortions TAB SAB Ect Mult Living                  Review of Systems   Review of symptoms negative unless otherwise noted in HPI.   Allergies  Codeine  Home Medications   Current Outpatient Rx  Name  Route  Sig  Dispense  Refill  . ASPIRIN 81 MG PO TABS   Oral   Take 81 mg by mouth daily.           . B COMPLEX PO TABS   Oral   Take 1 tablet by mouth daily.          Marland Kitchen  CALCIUM-VITAMIN D 600-200 MG-UNIT PO TABS   Oral   Take 1 tablet by mouth 2 (two) times daily.           Marland Kitchen HYPROMELLOSE 2.5 % OP SOLN   Both Eyes   Place 1 drop into both eyes at bedtime.           Marland Kitchen METOPROLOL TARTRATE 25 MG PO TABS   Oral   Take 1 tablet (25 mg total) by mouth daily. May take 1/2 to 1 tablet as needed for heart irregularity   60 tablet   11   . ICAPS PO   Oral   Take 2 tablets by mouth daily.          Marland Kitchen POLYETHYLENE GLYCOL 3350 POWD   Oral   Take 1 Package by mouth daily. For constipation as needed          . TOLTERODINE TARTRATE 2 MG PO TABS      Prn only         . VITAMIN E 100 UNITS PO CAPS   Oral  Take 100 Units by mouth daily.             BP 194/90  Pulse 92  Resp 16  Physical Exam  Nursing note and vitals reviewed. Constitutional: She appears well-developed and well-nourished. No distress.  HENT:  Head: Normocephalic and atraumatic.  Eyes: Conjunctivae normal are normal. Pupils are equal, round, and reactive to light. Right eye exhibits no discharge. Left eye exhibits no discharge.  Neck: Neck supple.  Cardiovascular: Normal rate, regular rhythm and normal heart sounds.  Exam reveals no gallop and no friction rub.   No murmur heard. Pulmonary/Chest: Effort normal and breath sounds normal. No respiratory distress.  Abdominal: Soft. She exhibits no distension. There is no tenderness.  Musculoskeletal: She exhibits no edema and no tenderness.       No midline spinal tenderness. Pelvis feels stable to rocking. Pain with ROM of L hip, particularly internal rotation. NVI distally.   Neurological: She is alert. No cranial nerve deficit. She exhibits normal muscle tone. Coordination normal.  Skin: Skin is warm and dry. She is not diaphoretic.  Psychiatric: She has a normal mood and affect. Her behavior is normal. Thought content normal.    ED Course  Procedures (including critical care time)  Labs Reviewed - No data to  display Dg Hip Complete Left  09/29/2012  *RADIOLOGY REPORT*  Clinical Data: Larey Seat, now with left hip pain  LEFT HIP - COMPLETE 2+ VIEW  Comparison: None.  Findings: The left hip appears intact.  However, there does appear to be a cortical irregularity of the inferior left pelvic ramus most consistent with a nondisplaced fracture.  No additional pelvic fracture is evident.  The SI joints appear symmetrical and normal.  IMPRESSION:  1.  Nondisplaced fracture of the left inferior pelvic ramus. 2.  No hip fracture is seen   Original Report Authenticated By: Dwyane Dee, M.D.      1. Inferior pubic ramus fracture       MDM  89yF female with left hip pain after mechanical fall. Imaging significant for a left inferior pubic ramus fracture. Patient has been ambulatory since her fall. This injury is weightbearing as tolerated. Will discuss with case management and social work to see if we can facilitate getting patient a walker and also have her level of care upgraded from independent living to either assisted living or skilled nursing temporarily. PRN pain meds and stool softener.         Raeford Razor, MD 09/29/12 1537

## 2012-09-29 NOTE — Patient Instructions (Addendum)
Continue same medications and return in 6 months. Continue to see cardiologist. He had been given influenza immunization and tetanus update today.

## 2012-09-29 NOTE — Progress Notes (Signed)
FL2 completed and faxed to [facility]. Zaineb Nowaczyk C. Salaya Holtrop MSW, LCSW [phone]

## 2012-09-29 NOTE — ED Notes (Signed)
Pt is anxious about going to a new apartment home. Pt is confused to time. Pt expresses sadness due to her dx and now the changing of her holiday plans. Therapeutic communication was offered. Pt more at ease with her situation.

## 2012-09-29 NOTE — Telephone Encounter (Signed)
Nurse calling from [facility] this am, stating fell in her room last night. Is having great deal of pain in lower back and hips. Sitting in a wheelchair unable to ambulate. Advised to take her to ED or call for an ambulance to transport her for evaluation.

## 2012-09-30 ENCOUNTER — Telehealth: Payer: Self-pay | Admitting: Internal Medicine

## 2012-09-30 DIAGNOSIS — R262 Difficulty in walking, not elsewhere classified: Secondary | ICD-10-CM | POA: Diagnosis not present

## 2012-09-30 DIAGNOSIS — R269 Unspecified abnormalities of gait and mobility: Secondary | ICD-10-CM | POA: Diagnosis not present

## 2012-09-30 DIAGNOSIS — M6281 Muscle weakness (generalized): Secondary | ICD-10-CM | POA: Diagnosis not present

## 2012-09-30 NOTE — Telephone Encounter (Signed)
Called at 11:45 PM last evening regarding medication management. Patient intolerant of codeine. She had been prescribed Percocet in the emergency department for pain with pelvic fracture secondary to fall. Recommend tramadol 50 mg every 8 hours as needed for pain. Also clarify with [facility] staff metoprolol 25 mg daily. She may need skilled care until pelvic fracture improves.

## 2012-10-01 DIAGNOSIS — S32810A Multiple fractures of pelvis with stable disruption of pelvic ring, initial encounter for closed fracture: Secondary | ICD-10-CM | POA: Diagnosis not present

## 2012-10-03 DIAGNOSIS — R262 Difficulty in walking, not elsewhere classified: Secondary | ICD-10-CM | POA: Diagnosis not present

## 2012-10-03 DIAGNOSIS — M6281 Muscle weakness (generalized): Secondary | ICD-10-CM | POA: Diagnosis not present

## 2012-10-03 DIAGNOSIS — R269 Unspecified abnormalities of gait and mobility: Secondary | ICD-10-CM | POA: Diagnosis not present

## 2012-10-03 DIAGNOSIS — M25559 Pain in unspecified hip: Secondary | ICD-10-CM | POA: Diagnosis not present

## 2012-10-03 DIAGNOSIS — S32509A Unspecified fracture of unspecified pubis, initial encounter for closed fracture: Secondary | ICD-10-CM | POA: Diagnosis not present

## 2012-10-04 DIAGNOSIS — R262 Difficulty in walking, not elsewhere classified: Secondary | ICD-10-CM | POA: Diagnosis not present

## 2012-10-04 DIAGNOSIS — R269 Unspecified abnormalities of gait and mobility: Secondary | ICD-10-CM | POA: Diagnosis not present

## 2012-10-04 DIAGNOSIS — M6281 Muscle weakness (generalized): Secondary | ICD-10-CM | POA: Diagnosis not present

## 2012-10-05 DIAGNOSIS — R262 Difficulty in walking, not elsewhere classified: Secondary | ICD-10-CM | POA: Diagnosis not present

## 2012-10-05 DIAGNOSIS — M6281 Muscle weakness (generalized): Secondary | ICD-10-CM | POA: Diagnosis not present

## 2012-10-05 DIAGNOSIS — R269 Unspecified abnormalities of gait and mobility: Secondary | ICD-10-CM | POA: Diagnosis not present

## 2012-10-07 DIAGNOSIS — R269 Unspecified abnormalities of gait and mobility: Secondary | ICD-10-CM | POA: Diagnosis not present

## 2012-10-07 DIAGNOSIS — R262 Difficulty in walking, not elsewhere classified: Secondary | ICD-10-CM | POA: Diagnosis not present

## 2012-10-07 DIAGNOSIS — M6281 Muscle weakness (generalized): Secondary | ICD-10-CM | POA: Diagnosis not present

## 2012-10-10 DIAGNOSIS — Z9181 History of falling: Secondary | ICD-10-CM | POA: Diagnosis not present

## 2012-10-10 DIAGNOSIS — M6281 Muscle weakness (generalized): Secondary | ICD-10-CM | POA: Diagnosis not present

## 2012-10-10 DIAGNOSIS — R262 Difficulty in walking, not elsewhere classified: Secondary | ICD-10-CM | POA: Diagnosis not present

## 2012-10-11 DIAGNOSIS — M6281 Muscle weakness (generalized): Secondary | ICD-10-CM | POA: Diagnosis not present

## 2012-10-11 DIAGNOSIS — R262 Difficulty in walking, not elsewhere classified: Secondary | ICD-10-CM | POA: Diagnosis not present

## 2012-10-11 DIAGNOSIS — Z9181 History of falling: Secondary | ICD-10-CM | POA: Diagnosis not present

## 2012-10-13 DIAGNOSIS — M6281 Muscle weakness (generalized): Secondary | ICD-10-CM | POA: Diagnosis not present

## 2012-10-13 DIAGNOSIS — Z9181 History of falling: Secondary | ICD-10-CM | POA: Diagnosis not present

## 2012-10-13 DIAGNOSIS — R262 Difficulty in walking, not elsewhere classified: Secondary | ICD-10-CM | POA: Diagnosis not present

## 2012-10-14 DIAGNOSIS — M6281 Muscle weakness (generalized): Secondary | ICD-10-CM | POA: Diagnosis not present

## 2012-10-14 DIAGNOSIS — R262 Difficulty in walking, not elsewhere classified: Secondary | ICD-10-CM | POA: Diagnosis not present

## 2012-10-14 DIAGNOSIS — Z9181 History of falling: Secondary | ICD-10-CM | POA: Diagnosis not present

## 2012-10-17 DIAGNOSIS — R262 Difficulty in walking, not elsewhere classified: Secondary | ICD-10-CM | POA: Diagnosis not present

## 2012-10-17 DIAGNOSIS — M6281 Muscle weakness (generalized): Secondary | ICD-10-CM | POA: Diagnosis not present

## 2012-10-17 DIAGNOSIS — Z9181 History of falling: Secondary | ICD-10-CM | POA: Diagnosis not present

## 2012-10-18 DIAGNOSIS — M6281 Muscle weakness (generalized): Secondary | ICD-10-CM | POA: Diagnosis not present

## 2012-10-18 DIAGNOSIS — Z9181 History of falling: Secondary | ICD-10-CM | POA: Diagnosis not present

## 2012-10-18 DIAGNOSIS — R262 Difficulty in walking, not elsewhere classified: Secondary | ICD-10-CM | POA: Diagnosis not present

## 2012-10-19 DIAGNOSIS — I471 Supraventricular tachycardia: Secondary | ICD-10-CM | POA: Diagnosis not present

## 2012-10-19 DIAGNOSIS — F411 Generalized anxiety disorder: Secondary | ICD-10-CM | POA: Diagnosis not present

## 2012-10-19 DIAGNOSIS — M810 Age-related osteoporosis without current pathological fracture: Secondary | ICD-10-CM | POA: Diagnosis not present

## 2012-10-19 DIAGNOSIS — S329XXA Fracture of unspecified parts of lumbosacral spine and pelvis, initial encounter for closed fracture: Secondary | ICD-10-CM | POA: Diagnosis not present

## 2012-10-19 DIAGNOSIS — I10 Essential (primary) hypertension: Secondary | ICD-10-CM | POA: Diagnosis not present

## 2012-10-20 DIAGNOSIS — Z9181 History of falling: Secondary | ICD-10-CM | POA: Diagnosis not present

## 2012-10-20 DIAGNOSIS — M6281 Muscle weakness (generalized): Secondary | ICD-10-CM | POA: Diagnosis not present

## 2012-10-20 DIAGNOSIS — R262 Difficulty in walking, not elsewhere classified: Secondary | ICD-10-CM | POA: Diagnosis not present

## 2012-10-21 DIAGNOSIS — M6281 Muscle weakness (generalized): Secondary | ICD-10-CM | POA: Diagnosis not present

## 2012-10-21 DIAGNOSIS — R262 Difficulty in walking, not elsewhere classified: Secondary | ICD-10-CM | POA: Diagnosis not present

## 2012-10-21 DIAGNOSIS — Z9181 History of falling: Secondary | ICD-10-CM | POA: Diagnosis not present

## 2012-10-24 DIAGNOSIS — S32509A Unspecified fracture of unspecified pubis, initial encounter for closed fracture: Secondary | ICD-10-CM | POA: Diagnosis not present

## 2012-10-24 DIAGNOSIS — M6281 Muscle weakness (generalized): Secondary | ICD-10-CM | POA: Diagnosis not present

## 2012-10-24 DIAGNOSIS — Z9181 History of falling: Secondary | ICD-10-CM | POA: Diagnosis not present

## 2012-10-24 DIAGNOSIS — R262 Difficulty in walking, not elsewhere classified: Secondary | ICD-10-CM | POA: Diagnosis not present

## 2012-10-25 DIAGNOSIS — M6281 Muscle weakness (generalized): Secondary | ICD-10-CM | POA: Diagnosis not present

## 2012-10-25 DIAGNOSIS — E785 Hyperlipidemia, unspecified: Secondary | ICD-10-CM | POA: Diagnosis not present

## 2012-10-25 DIAGNOSIS — Z9181 History of falling: Secondary | ICD-10-CM | POA: Diagnosis not present

## 2012-10-25 DIAGNOSIS — R262 Difficulty in walking, not elsewhere classified: Secondary | ICD-10-CM | POA: Diagnosis not present

## 2012-10-25 DIAGNOSIS — E039 Hypothyroidism, unspecified: Secondary | ICD-10-CM | POA: Diagnosis not present

## 2012-10-25 DIAGNOSIS — I10 Essential (primary) hypertension: Secondary | ICD-10-CM | POA: Diagnosis not present

## 2012-10-25 DIAGNOSIS — R0989 Other specified symptoms and signs involving the circulatory and respiratory systems: Secondary | ICD-10-CM | POA: Diagnosis not present

## 2012-10-26 DIAGNOSIS — R262 Difficulty in walking, not elsewhere classified: Secondary | ICD-10-CM | POA: Diagnosis not present

## 2012-10-26 DIAGNOSIS — M6281 Muscle weakness (generalized): Secondary | ICD-10-CM | POA: Diagnosis not present

## 2012-10-26 DIAGNOSIS — Z9181 History of falling: Secondary | ICD-10-CM | POA: Diagnosis not present

## 2012-11-09 DIAGNOSIS — R269 Unspecified abnormalities of gait and mobility: Secondary | ICD-10-CM | POA: Diagnosis not present

## 2012-11-09 DIAGNOSIS — Z9181 History of falling: Secondary | ICD-10-CM | POA: Diagnosis not present

## 2012-11-09 DIAGNOSIS — M6281 Muscle weakness (generalized): Secondary | ICD-10-CM | POA: Diagnosis not present

## 2012-11-09 DIAGNOSIS — H4050X0 Glaucoma secondary to other eye disorders, unspecified eye, stage unspecified: Secondary | ICD-10-CM | POA: Diagnosis not present

## 2012-11-11 DIAGNOSIS — M6281 Muscle weakness (generalized): Secondary | ICD-10-CM | POA: Diagnosis not present

## 2012-11-11 DIAGNOSIS — Q132 Other congenital malformations of iris: Secondary | ICD-10-CM | POA: Diagnosis not present

## 2012-11-11 DIAGNOSIS — Z9181 History of falling: Secondary | ICD-10-CM | POA: Diagnosis not present

## 2012-11-11 DIAGNOSIS — R269 Unspecified abnormalities of gait and mobility: Secondary | ICD-10-CM | POA: Diagnosis not present

## 2012-11-14 ENCOUNTER — Telehealth: Payer: Self-pay | Admitting: Internal Medicine

## 2012-11-14 DIAGNOSIS — M6281 Muscle weakness (generalized): Secondary | ICD-10-CM | POA: Diagnosis not present

## 2012-11-14 DIAGNOSIS — H4050X0 Glaucoma secondary to other eye disorders, unspecified eye, stage unspecified: Secondary | ICD-10-CM | POA: Diagnosis not present

## 2012-11-14 DIAGNOSIS — Z9181 History of falling: Secondary | ICD-10-CM | POA: Diagnosis not present

## 2012-11-14 DIAGNOSIS — R269 Unspecified abnormalities of gait and mobility: Secondary | ICD-10-CM | POA: Diagnosis not present

## 2012-11-14 NOTE — Telephone Encounter (Signed)
Spoke with Brittany Warren who states that this was a miscommunication between a phys therapist and Brittany Warren. Patient simply forgot to do some arm exercises, but is completely lucid. Back in her own room now. No UTI symptoms.

## 2012-11-14 NOTE — Telephone Encounter (Signed)
Confusion may be related to prolonged rehab. Is she ambulatory?

## 2012-11-14 NOTE — Telephone Encounter (Signed)
Is she still in residential care or back in her apartment? Is Dr. Chilton Si overseeing her care at all? Please call [facility] and clarify.

## 2012-11-15 DIAGNOSIS — Z9181 History of falling: Secondary | ICD-10-CM | POA: Diagnosis not present

## 2012-11-15 DIAGNOSIS — H4050X0 Glaucoma secondary to other eye disorders, unspecified eye, stage unspecified: Secondary | ICD-10-CM | POA: Diagnosis not present

## 2012-11-15 DIAGNOSIS — M6281 Muscle weakness (generalized): Secondary | ICD-10-CM | POA: Diagnosis not present

## 2012-11-15 DIAGNOSIS — R269 Unspecified abnormalities of gait and mobility: Secondary | ICD-10-CM | POA: Diagnosis not present

## 2012-11-18 DIAGNOSIS — M6281 Muscle weakness (generalized): Secondary | ICD-10-CM | POA: Diagnosis not present

## 2012-11-18 DIAGNOSIS — H4050X0 Glaucoma secondary to other eye disorders, unspecified eye, stage unspecified: Secondary | ICD-10-CM | POA: Diagnosis not present

## 2012-11-18 DIAGNOSIS — Z9181 History of falling: Secondary | ICD-10-CM | POA: Diagnosis not present

## 2012-11-18 DIAGNOSIS — R269 Unspecified abnormalities of gait and mobility: Secondary | ICD-10-CM | POA: Diagnosis not present

## 2012-11-22 DIAGNOSIS — Z9181 History of falling: Secondary | ICD-10-CM | POA: Diagnosis not present

## 2012-11-22 DIAGNOSIS — Q132 Other congenital malformations of iris: Secondary | ICD-10-CM | POA: Diagnosis not present

## 2012-11-22 DIAGNOSIS — M6281 Muscle weakness (generalized): Secondary | ICD-10-CM | POA: Diagnosis not present

## 2012-11-22 DIAGNOSIS — R269 Unspecified abnormalities of gait and mobility: Secondary | ICD-10-CM | POA: Diagnosis not present

## 2012-11-23 DIAGNOSIS — R269 Unspecified abnormalities of gait and mobility: Secondary | ICD-10-CM | POA: Diagnosis not present

## 2012-11-23 DIAGNOSIS — Z9181 History of falling: Secondary | ICD-10-CM | POA: Diagnosis not present

## 2012-11-23 DIAGNOSIS — M6281 Muscle weakness (generalized): Secondary | ICD-10-CM | POA: Diagnosis not present

## 2012-11-23 DIAGNOSIS — H4050X0 Glaucoma secondary to other eye disorders, unspecified eye, stage unspecified: Secondary | ICD-10-CM | POA: Diagnosis not present

## 2012-11-25 DIAGNOSIS — R269 Unspecified abnormalities of gait and mobility: Secondary | ICD-10-CM | POA: Diagnosis not present

## 2012-11-25 DIAGNOSIS — M6281 Muscle weakness (generalized): Secondary | ICD-10-CM | POA: Diagnosis not present

## 2012-11-25 DIAGNOSIS — Z9181 History of falling: Secondary | ICD-10-CM | POA: Diagnosis not present

## 2012-11-25 DIAGNOSIS — H4050X0 Glaucoma secondary to other eye disorders, unspecified eye, stage unspecified: Secondary | ICD-10-CM | POA: Diagnosis not present

## 2012-11-25 DIAGNOSIS — Q132 Other congenital malformations of iris: Secondary | ICD-10-CM | POA: Diagnosis not present

## 2012-12-02 DIAGNOSIS — H4050X0 Glaucoma secondary to other eye disorders, unspecified eye, stage unspecified: Secondary | ICD-10-CM | POA: Diagnosis not present

## 2012-12-02 DIAGNOSIS — M6281 Muscle weakness (generalized): Secondary | ICD-10-CM | POA: Diagnosis not present

## 2012-12-02 DIAGNOSIS — R269 Unspecified abnormalities of gait and mobility: Secondary | ICD-10-CM | POA: Diagnosis not present

## 2012-12-02 DIAGNOSIS — Z9181 History of falling: Secondary | ICD-10-CM | POA: Diagnosis not present

## 2012-12-05 DIAGNOSIS — H3530 Unspecified macular degeneration: Secondary | ICD-10-CM | POA: Diagnosis not present

## 2012-12-05 DIAGNOSIS — H409 Unspecified glaucoma: Secondary | ICD-10-CM | POA: Diagnosis not present

## 2012-12-05 DIAGNOSIS — Z961 Presence of intraocular lens: Secondary | ICD-10-CM | POA: Diagnosis not present

## 2012-12-05 DIAGNOSIS — H4011X0 Primary open-angle glaucoma, stage unspecified: Secondary | ICD-10-CM | POA: Diagnosis not present

## 2012-12-12 DIAGNOSIS — H4050X0 Glaucoma secondary to other eye disorders, unspecified eye, stage unspecified: Secondary | ICD-10-CM | POA: Diagnosis not present

## 2012-12-12 DIAGNOSIS — M6281 Muscle weakness (generalized): Secondary | ICD-10-CM | POA: Diagnosis not present

## 2012-12-15 DIAGNOSIS — H4011X0 Primary open-angle glaucoma, stage unspecified: Secondary | ICD-10-CM | POA: Diagnosis not present

## 2012-12-15 DIAGNOSIS — H409 Unspecified glaucoma: Secondary | ICD-10-CM | POA: Diagnosis not present

## 2012-12-27 DIAGNOSIS — M810 Age-related osteoporosis without current pathological fracture: Secondary | ICD-10-CM | POA: Diagnosis not present

## 2012-12-27 DIAGNOSIS — F411 Generalized anxiety disorder: Secondary | ICD-10-CM | POA: Diagnosis not present

## 2012-12-27 DIAGNOSIS — Z13220 Encounter for screening for lipoid disorders: Secondary | ICD-10-CM | POA: Diagnosis not present

## 2012-12-27 DIAGNOSIS — I10 Essential (primary) hypertension: Secondary | ICD-10-CM | POA: Diagnosis not present

## 2012-12-27 LAB — TSH: TSH: 3.34 u[IU]/mL (ref 0.41–5.90)

## 2012-12-27 LAB — BASIC METABOLIC PANEL
BUN: 12 mg/dL (ref 4–21)
Creatinine: 1 mg/dL (ref 0.5–1.1)
Glucose: 94 mg/dL
Potassium: 4.5 mmol/L (ref 3.4–5.3)
Sodium: 143 mmol/L (ref 137–147)

## 2012-12-27 LAB — LIPID PANEL: Triglycerides: 99 mg/dL (ref 40–160)

## 2012-12-27 LAB — HEPATIC FUNCTION PANEL
AST: 19 U/L (ref 13–35)
Bilirubin, Total: 0.5 mg/dL

## 2013-01-02 DIAGNOSIS — H903 Sensorineural hearing loss, bilateral: Secondary | ICD-10-CM | POA: Diagnosis not present

## 2013-01-05 DIAGNOSIS — I359 Nonrheumatic aortic valve disorder, unspecified: Secondary | ICD-10-CM | POA: Diagnosis not present

## 2013-01-05 DIAGNOSIS — I10 Essential (primary) hypertension: Secondary | ICD-10-CM | POA: Diagnosis not present

## 2013-01-05 DIAGNOSIS — R32 Unspecified urinary incontinence: Secondary | ICD-10-CM | POA: Diagnosis not present

## 2013-01-05 DIAGNOSIS — H60399 Other infective otitis externa, unspecified ear: Secondary | ICD-10-CM | POA: Diagnosis not present

## 2013-01-05 DIAGNOSIS — H409 Unspecified glaucoma: Secondary | ICD-10-CM | POA: Diagnosis not present

## 2013-01-05 DIAGNOSIS — H4050X0 Glaucoma secondary to other eye disorders, unspecified eye, stage unspecified: Secondary | ICD-10-CM | POA: Diagnosis not present

## 2013-01-05 DIAGNOSIS — M6281 Muscle weakness (generalized): Secondary | ICD-10-CM | POA: Diagnosis not present

## 2013-01-05 DIAGNOSIS — H547 Unspecified visual loss: Secondary | ICD-10-CM | POA: Diagnosis not present

## 2013-02-07 DIAGNOSIS — H353290 Exudative age-related macular degeneration, unspecified eye, stage unspecified: Secondary | ICD-10-CM | POA: Diagnosis not present

## 2013-02-07 DIAGNOSIS — H348392 Tributary (branch) retinal vein occlusion, unspecified eye, stable: Secondary | ICD-10-CM | POA: Diagnosis not present

## 2013-02-07 DIAGNOSIS — H4011X0 Primary open-angle glaucoma, stage unspecified: Secondary | ICD-10-CM | POA: Diagnosis not present

## 2013-02-07 DIAGNOSIS — H353190 Nonexudative age-related macular degeneration, unspecified eye, stage unspecified: Secondary | ICD-10-CM | POA: Diagnosis not present

## 2013-03-16 ENCOUNTER — Encounter: Payer: Self-pay | Admitting: Nurse Practitioner

## 2013-04-05 DIAGNOSIS — C44519 Basal cell carcinoma of skin of other part of trunk: Secondary | ICD-10-CM | POA: Diagnosis not present

## 2013-04-06 ENCOUNTER — Encounter (HOSPITAL_COMMUNITY): Payer: Self-pay | Admitting: Emergency Medicine

## 2013-04-06 ENCOUNTER — Emergency Department (HOSPITAL_COMMUNITY): Payer: Medicare Other

## 2013-04-06 ENCOUNTER — Inpatient Hospital Stay (HOSPITAL_COMMUNITY)
Admission: EM | Admit: 2013-04-06 | Discharge: 2013-04-07 | DRG: 194 | Disposition: A | Discharge status: Home / Self Care | Payer: Medicare Other | Attending: Internal Medicine | Admitting: Internal Medicine

## 2013-04-06 DIAGNOSIS — E869 Volume depletion, unspecified: Secondary | ICD-10-CM

## 2013-04-06 DIAGNOSIS — R031 Nonspecific low blood-pressure reading: Secondary | ICD-10-CM | POA: Diagnosis not present

## 2013-04-06 DIAGNOSIS — E860 Dehydration: Secondary | ICD-10-CM | POA: Diagnosis present

## 2013-04-06 DIAGNOSIS — I471 Supraventricular tachycardia: Secondary | ICD-10-CM

## 2013-04-06 DIAGNOSIS — F411 Generalized anxiety disorder: Secondary | ICD-10-CM | POA: Diagnosis present

## 2013-04-06 DIAGNOSIS — R000 Tachycardia, unspecified: Secondary | ICD-10-CM | POA: Diagnosis present

## 2013-04-06 DIAGNOSIS — R42 Dizziness and giddiness: Secondary | ICD-10-CM | POA: Diagnosis not present

## 2013-04-06 DIAGNOSIS — M810 Age-related osteoporosis without current pathological fracture: Secondary | ICD-10-CM | POA: Diagnosis present

## 2013-04-06 DIAGNOSIS — Z87891 Personal history of nicotine dependence: Secondary | ICD-10-CM | POA: Diagnosis not present

## 2013-04-06 DIAGNOSIS — I10 Essential (primary) hypertension: Secondary | ICD-10-CM | POA: Diagnosis present

## 2013-04-06 DIAGNOSIS — R5381 Other malaise: Secondary | ICD-10-CM | POA: Diagnosis not present

## 2013-04-06 DIAGNOSIS — I959 Hypotension, unspecified: Secondary | ICD-10-CM | POA: Diagnosis present

## 2013-04-06 DIAGNOSIS — J189 Pneumonia, unspecified organism: Secondary | ICD-10-CM

## 2013-04-06 DIAGNOSIS — I498 Other specified cardiac arrhythmias: Secondary | ICD-10-CM | POA: Diagnosis not present

## 2013-04-06 DIAGNOSIS — H409 Unspecified glaucoma: Secondary | ICD-10-CM | POA: Diagnosis present

## 2013-04-06 DIAGNOSIS — H5460 Unqualified visual loss, one eye, unspecified: Secondary | ICD-10-CM | POA: Diagnosis present

## 2013-04-06 DIAGNOSIS — N179 Acute kidney failure, unspecified: Secondary | ICD-10-CM | POA: Diagnosis present

## 2013-04-06 DIAGNOSIS — Z79899 Other long term (current) drug therapy: Secondary | ICD-10-CM

## 2013-04-06 DIAGNOSIS — R404 Transient alteration of awareness: Secondary | ICD-10-CM | POA: Diagnosis not present

## 2013-04-06 HISTORY — DX: Pneumonia, unspecified organism: J18.9

## 2013-04-06 LAB — BASIC METABOLIC PANEL
BUN: 32 mg/dL — ABNORMAL HIGH (ref 6–23)
CO2: 23 mEq/L (ref 19–32)
Calcium: 8.8 mg/dL (ref 8.4–10.5)
Creatinine, Ser: 1.23 mg/dL — ABNORMAL HIGH (ref 0.50–1.10)
Glucose, Bld: 114 mg/dL — ABNORMAL HIGH (ref 70–99)

## 2013-04-06 LAB — CBC
HCT: 36.5 % (ref 36.0–46.0)
Hemoglobin: 12.3 g/dL (ref 12.0–15.0)
Hemoglobin: 10.9 g/dL — ABNORMAL LOW (ref 12.0–15.0)
MCH: 30.8 pg (ref 26.0–34.0)
MCH: 30.3 pg (ref 26.0–34.0)
MCHC: 33.1 g/dL (ref 30.0–36.0)
MCV: 91.3 fL (ref 78.0–100.0)
Platelets: 161 10*3/uL (ref 150–400)
RBC: 4 MIL/uL (ref 3.87–5.11)

## 2013-04-06 LAB — CREATININE, SERUM: Creatinine, Ser: 1.22 mg/dL — ABNORMAL HIGH (ref 0.50–1.10)

## 2013-04-06 MED ORDER — CEFTRIAXONE SODIUM 1 G IJ SOLR
1.0000 g | INTRAMUSCULAR | Status: DC
  Administered 2013-04-06: 1 g via INTRAVENOUS
  Filled 2013-04-06: qty 10

## 2013-04-06 MED ORDER — AZITHROMYCIN 500 MG PO TABS
500.0000 mg | ORAL_TABLET | Freq: Every day | ORAL | Status: DC
  Administered 2013-04-07: 500 mg via ORAL
  Filled 2013-04-06: qty 1

## 2013-04-06 MED ORDER — B COMPLEX PO TABS: 1.0000 | ORAL_TABLET | Freq: Every day | ORAL | Status: DC

## 2013-04-06 MED ORDER — VITAMIN E 45 MG (100 UNIT) PO CAPS
100.0000 [IU] | ORAL_CAPSULE | Freq: Every day | ORAL | Status: DC
  Administered 2013-04-06 – 2013-04-07 (×2): 100 [IU] via ORAL
  Filled 2013-04-06 (×2): qty 1

## 2013-04-06 MED ORDER — DEXTROSE 5 % IV SOLN
1.0000 g | Freq: Every day | INTRAVENOUS | Status: DC
  Administered 2013-04-07: 1 g via INTRAVENOUS
  Filled 2013-04-06: qty 10

## 2013-04-06 MED ORDER — SODIUM CHLORIDE 0.9 % IV SOLN
Freq: Once | INTRAVENOUS | Status: AC
  Administered 2013-04-06: 11:00:00 via INTRAVENOUS

## 2013-04-06 MED ORDER — B COMPLEX-C PO TABS
1.0000 | ORAL_TABLET | Freq: Every day | ORAL | Status: DC
  Administered 2013-04-06 – 2013-04-07 (×2): 1 via ORAL
  Filled 2013-04-06 (×2): qty 1

## 2013-04-06 MED ORDER — LIDOCAINE HCL (PF) 1 % IJ SOLN: 1.0000 mL | Freq: Once | INTRAMUSCULAR | Status: DC

## 2013-04-06 MED ORDER — ENOXAPARIN SODIUM 40 MG/0.4ML ~~LOC~~ SOLN
40.0000 mg | SUBCUTANEOUS | Status: DC
  Filled 2013-04-06 (×2): qty 0.4

## 2013-04-06 MED ORDER — AZITHROMYCIN 500 MG PO TABS: 500.0000 mg | ORAL_TABLET | Freq: Every day | ORAL | Status: DC

## 2013-04-06 MED ORDER — POLYVINYL ALCOHOL 1.4 % OP SOLN
1.0000 [drp] | Freq: Four times a day (QID) | OPHTHALMIC | Status: DC
  Administered 2013-04-06 – 2013-04-07 (×2): 1 [drp] via OPHTHALMIC
  Filled 2013-04-06: qty 15

## 2013-04-06 MED ORDER — ONDANSETRON HCL 4 MG PO TABS: 4.0000 mg | ORAL_TABLET | Freq: Four times a day (QID) | ORAL | Status: DC | PRN

## 2013-04-06 MED ORDER — POLYETHYLENE GLYCOL 3350 17 G PO PACK
17.0000 g | PACK | Freq: Every day | ORAL | Status: DC
  Filled 2013-04-06: qty 1

## 2013-04-06 MED ORDER — METOPROLOL TARTRATE 12.5 MG HALF TABLET
12.5000 mg | ORAL_TABLET | Freq: Every day | ORAL | Status: DC
  Administered 2013-04-07: 12.5 mg via ORAL
  Filled 2013-04-06: qty 1

## 2013-04-06 MED ORDER — CALCIUM CARBONATE-VITAMIN D 500-200 MG-UNIT PO TABS
1.0000 | ORAL_TABLET | Freq: Every day | ORAL | Status: DC
  Administered 2013-04-07: 1 via ORAL
  Filled 2013-04-06 (×3): qty 1

## 2013-04-06 MED ORDER — SODIUM CHLORIDE 0.9 % IV SOLN
Freq: Once | INTRAVENOUS | Status: AC
  Administered 2013-04-06: 14:00:00 via INTRAVENOUS

## 2013-04-06 MED ORDER — SODIUM CHLORIDE 0.9 % IV SOLN
INTRAVENOUS | Status: DC
  Administered 2013-04-06: 20:00:00 via INTRAVENOUS

## 2013-04-06 MED ORDER — HYPROMELLOSE (GONIOSCOPIC) 2.5 % OP SOLN: 1.0000 [drp] | Freq: Four times a day (QID) | OPHTHALMIC | Status: DC

## 2013-04-06 MED ORDER — DEXTROSE 5 % IV SOLN
500.0000 mg | Freq: Once | INTRAVENOUS | Status: DC
  Filled 2013-04-06: qty 500

## 2013-04-06 MED ORDER — ONDANSETRON HCL 4 MG/2ML IJ SOLN: 4.0000 mg | Freq: Four times a day (QID) | INTRAMUSCULAR | Status: DC | PRN

## 2013-04-06 MED ORDER — CEFTRIAXONE SODIUM 1 G IJ SOLR
1.0000 g | Freq: Once | INTRAMUSCULAR | Status: DC
  Filled 2013-04-06: qty 10

## 2013-04-06 MED ORDER — VANCOMYCIN HCL IN DEXTROSE 1-5 GM/200ML-% IV SOLN
1000.0000 mg | Freq: Once | INTRAVENOUS | Status: AC
  Administered 2013-04-06: 1000 mg via INTRAVENOUS
  Filled 2013-04-06: qty 200

## 2013-04-06 MED ORDER — TRAMADOL HCL 50 MG PO TABS
50.0000 mg | ORAL_TABLET | Freq: Three times a day (TID) | ORAL | Status: DC | PRN
  Filled 2013-04-06: qty 1

## 2013-04-06 MED ORDER — SODIUM CHLORIDE (HYPERTONIC) 5 % OP OINT: TOPICAL_OINTMENT | Freq: Every day | OPHTHALMIC | Status: DC

## 2013-04-06 MED ORDER — DOCUSATE SODIUM 100 MG PO CAPS
100.0000 mg | ORAL_CAPSULE | Freq: Two times a day (BID) | ORAL | Status: DC
  Administered 2013-04-06 – 2013-04-07 (×2): 100 mg via ORAL
  Filled 2013-04-06 (×3): qty 1

## 2013-04-06 MED ORDER — PIPERACILLIN-TAZOBACTAM 3.375 G IVPB: 3.3750 g | Freq: Once | INTRAVENOUS | Status: DC

## 2013-04-06 MED ORDER — CALCIUM-VITAMIN D 600-200 MG-UNIT PO TABS: 1.0000 | ORAL_TABLET | Freq: Every day | ORAL | Status: DC

## 2013-04-06 MED ORDER — ACETAMINOPHEN 650 MG RE SUPP: 650.0000 mg | Freq: Four times a day (QID) | RECTAL | Status: DC | PRN

## 2013-04-06 MED ORDER — OXYBUTYNIN CHLORIDE ER 10 MG PO TB24
10.0000 mg | ORAL_TABLET | Freq: Every day | ORAL | Status: DC
  Administered 2013-04-06: 10 mg via ORAL
  Filled 2013-04-06 (×2): qty 1

## 2013-04-06 MED ORDER — METOPROLOL TARTRATE 1 MG/ML IV SOLN
5.0000 mg | Freq: Once | INTRAVENOUS | Status: DC
  Filled 2013-04-06: qty 5

## 2013-04-06 MED ORDER — SODIUM CHLORIDE (HYPERTONIC) 5 % OP OINT
TOPICAL_OINTMENT | Freq: Every day | OPHTHALMIC | Status: DC
  Filled 2013-04-06: qty 3.5

## 2013-04-06 MED ORDER — SODIUM CHLORIDE 0.9 % IV SOLN: Freq: Once | INTRAVENOUS | Status: DC

## 2013-04-06 MED ORDER — ACETAMINOPHEN 325 MG PO TABS: 650.0000 mg | ORAL_TABLET | Freq: Four times a day (QID) | ORAL | Status: DC | PRN

## 2013-04-06 MED ORDER — METOPROLOL TARTRATE 1 MG/ML IV SOLN
5.0000 mg | Freq: Once | INTRAVENOUS | Status: AC
  Administered 2013-04-06: 5 mg via INTRAVENOUS
  Filled 2013-04-06: qty 5

## 2013-04-06 NOTE — ED Notes (Signed)
[facility], c/o dizziness and slight nausea this morning. Pt  States she does not feel "uncomfortable at this time" Denies cp.sob, no signs of distress at this time.

## 2013-04-06 NOTE — ED Notes (Signed)
Sharee Pimple (step-daughter) cell - [phone]

## 2013-04-06 NOTE — ED Provider Notes (Signed)
History     CSN: [id number]  Arrival date & time 04/06/13  1025   None     Chief Complaint  Patient presents with  . Dizziness    (Consider location/radiation/quality/duration/timing/severity/associated sxs/prior treatment) Patient is a 79 y.o. female presenting with weakness. The history is provided by the patient. No language interpreter was used.  Weakness This is a new problem. The current episode started today. The problem occurs intermittently. The problem has been resolved. Associated symptoms include fatigue and weakness. Pertinent negatives include no chest pain or fever. Nothing aggravates the symptoms. Treatments tried: metoprolol. The treatment provided significant relief.  Pt reports she felt light headed and nauseated this am.   Pt reports she drank some milk, ate break and took her metoprolol and symptoms almost resolved.   Pt reports she feels much better now.    Past Medical History  Diagnosis Date  . Glaucoma   . Anxiety     mild  . PVC (premature ventricular contraction)   . Osteoporosis   . History of recurrent UTIs   . Vision loss of left eye   . SVT (supraventricular tachycardia)   . Aortic stenosis     a. Echo 03/2010:  EF 70-75%, mild AS    Past Surgical History  Procedure Laterality Date  . Tonsillectomy    . Appendectomy    . Eye surgery      L for glaucoma    History reviewed. No pertinent family history.  History  Substance Use Topics  . Smoking status: Former Smoker -- 0.50 packs/day for 30 years    Types: Cigarettes    Quit date: 01/14/1984  . Smokeless tobacco: Never Used  . Alcohol Use: Yes     Comment: wine socially    OB History   Grav Para Term Preterm Abortions TAB SAB Ect Mult Living                  Review of Systems  Constitutional: Positive for fatigue. Negative for fever.  Cardiovascular: Negative for chest pain.  Neurological: Positive for weakness.  All other systems reviewed and are negative.    Allergies   Codeine  Home Medications   Current Outpatient Rx  Name  Route  Sig  Dispense  Refill  . b complex vitamins tablet   Oral   Take 1 tablet by mouth daily.          . Calcium-Vitamin D 600-200 MG-UNIT per tablet   Oral   Take 1 tablet by mouth daily.          Marland Kitchen docusate sodium (COLACE) 100 MG capsule   Oral   Take 1 capsule (100 mg total) by mouth 2 (two) times daily.   30 capsule   0   . hydroxypropyl methylcellulose (ISOPTO TEARS) 2.5 % ophthalmic solution   Right Eye   Place 1 drop into the right eye 4 (four) times daily.          . metoprolol tartrate (LOPRESSOR) 25 MG tablet   Oral   Take 1 tablet (25 mg total) by mouth daily. May take 1/2 to 1 tablet as needed for heart irregularity   60 tablet   11   . Multiple Vitamins-Minerals (ICAPS PO)   Oral   Take 2 tablets by mouth daily.          Marland Kitchen oxyCODONE-acetaminophen (PERCOCET/ROXICET) 5-325 MG per tablet   Oral   Take 1 tablet by mouth every 4 (four) hours as needed  for pain.   20 tablet   0   . Polyethylene Glycol 3350 POWD   Oral   Take 1 Package by mouth daily. For constipation as needed          . tolterodine (DETROL) 2 MG tablet      2 mg 2 (two) times daily as needed. Take as needed for urinary frequency         . vitamin E 100 UNIT capsule   Oral   Take 100 Units by mouth daily.             BP 108/58  Pulse 122  Temp(Src) 97.5 F (36.4 C) (Oral)  Resp 23  SpO2 99%  Physical Exam  Nursing note and vitals reviewed. Constitutional: She is oriented to person, place, and time. She appears well-developed and well-nourished.  HENT:  Head: Normocephalic.  Right Ear: External ear normal.  Left Ear: External ear normal.  Nose: Nose normal.  Mouth/Throat: Oropharynx is clear and moist.  Eyes: Conjunctivae and EOM are normal. Pupils are equal, round, and reactive to light.  Neck: Normal range of motion. Neck supple.  Cardiovascular: Normal heart sounds.   tachycardia   Pulmonary/Chest: Effort normal and breath sounds normal.  Abdominal: Soft.  Musculoskeletal: Normal range of motion.  Neurological: She is alert and oriented to person, place, and time. She has normal reflexes.  Skin: Skin is warm.  Psychiatric: She has a normal mood and affect.    ED Course  Procedures (including critical care time)  Labs Reviewed  CBC  BASIC METABOLIC PANEL   No results found.   1. CAP (community acquired pneumonia)       MDM   Date: 04/06/2013  Rate: 119  Rhythm: sinus tachycardia  QRS Axis: left  Intervals: normal  ST/T Wave abnormalities: normal  Conduction Disutrbances:none  Narrative Interpretation:   Old EKG Reviewed: none available Results for orders placed during the hospital encounter of 04/06/13  CBC      Result Value Range   WBC 3.6 (*) 4.0 - 10.5 K/uL   RBC 4.00  3.87 - 5.11 MIL/uL   Hemoglobin 12.3  12.0 - 15.0 g/dL   HCT 16.1  09.6 - 04.5 %   MCV 91.3  78.0 - 100.0 fL   MCH 30.8  26.0 - 34.0 pg   MCHC 33.7  30.0 - 36.0 g/dL   RDW 40.9  81.1 - 91.4 %   Platelets 160  150 - 400 K/uL  BASIC METABOLIC PANEL      Result Value Range   Sodium 138  135 - 145 mEq/L   Potassium 4.3  3.5 - 5.1 mEq/L   Chloride 103  96 - 112 mEq/L   CO2 23  19 - 32 mEq/L   Glucose, Bld 114 (*) 70 - 99 mg/dL   BUN 32 (*) 6 - 23 mg/dL   Creatinine, Ser 7.82 (*) 0.50 - 1.10 mg/dL   Calcium 8.8  8.4 - 95.6 mg/dL   GFR calc non Af Amer 36 (*) >90 mL/min   GFR calc Af Amer 42 (*) >90 mL/min  TROPONIN I      Result Value Range   Troponin I <0.30  <0.30 ng/mL   Dg Chest 2 View  04/06/2013   *RADIOLOGY REPORT*  Clinical Data: Tachycardia  CHEST - 2 VIEW  Comparison: 03/21/2012  Findings: Patchy right lower lobe opacity, atelectasis versus pneumonia.  Mild left basilar opacity, likely atelectasis. Increased interstitial markings, likely chronic.  No  pleural effusion or pneumothorax.  Cardiomegaly.  Degenerative changes of the visualized thoracolumbar spine.   IMPRESSION: Mild patchy right lower lobe opacity, atelectasis versus pneumonia.   Original Report Authenticated By: Charline Bills, M.D.           Lonia Skinner [place], PA-C 04/06/13 1434

## 2013-04-06 NOTE — Progress Notes (Addendum)
Received patient from ED,alert and oriented x 3 person,place and time,Moe x4 against resistance,Puplils sluggish,Lft eye blind,Rt .eye burred vision,speech is clear,able to follow command,palpable radial and pedal pulses no edema,capillary refill less than 3 secs,Bowel x4 in all quadrant,abdomen soft non,distended,no complain of pain,no skin issue,pleasant and cooperative.

## 2013-04-06 NOTE — ED Notes (Signed)
Pt returned from xray

## 2013-04-06 NOTE — ED Notes (Signed)
Patient transported to X-ray 

## 2013-04-06 NOTE — H&P (Addendum)
[facility] History and Physical  KORINE Warren YQM:[id number] DOB: Dec 31, 1916 DOA: 04/06/2013  Referring physician:Dr Fonnie Jarvis  PCP: Kimber Relic, MD  Specialists:   Chief Complaint: gen malaise  HPI: Brittany Warren is a 79 y.o. female  resident of independent living at [facility] with PMH as listed below who presents with the above c/o. She states that when she woke up this am she 'just did not feel right'- decreased energy, felt like her stomach was upset(but no pain) and did not feel like eating breakfast. So she took her metoprolol, sat down and seemed to feel better but subsequently still was not feeling like her usual self so she called the nurse, who checked her BP ant it was low so she asked her to come to the ED. At the time of my interview pt denies having had any dizziness or nausea(although documented in ED notes). She also denies cough, CP, SOB, dysuria, diarrhea, fever, diarrhea, and no melena or hematochezia. In the ED she was initially hypotensive with SBP in the 90s, and tachycardic. EKG showed junctional tachy at 119. She was started on IVF and given metoprolol with improvement in her HR to the 80S.  CXR revealed Mild patchy right lower lobe opacity, atelectasis versus pneumonia, with leukopenia. She was started on empiric abx and admission to [facility] requested for further eval and management.     Review of Systems: The patient denies, fever, weight loss, vision loss, decreased hearing, hoarseness, chest pain, syncope, dyspnea on exertion, peripheral edema, balance deficits, hemoptysis, abdominal pain, melena, hematochezia, severe indigestion/heartburn, hematuria, incontinence, muscle weakness, suspicious skin lesions, transient blindness, difficulty walking, depression, unusual weight change.  Past Medical History  Diagnosis Date  . Glaucoma     "both eyes; no vision in left eye as a result" (04/06/2013)  . Anxiety     mild  . PVC (premature ventricular contraction)   .  Osteoporosis   . History of recurrent UTIs   . Vision loss of left eye     "can't see at all out of it" (04/06/2013)  . SVT (supraventricular tachycardia)   . Aortic stenosis     a. Echo 03/2010:  EF 70-75%, mild AS  . Pneumonia 04/06/2013    "for the first time" (04/06/2013)   Past Surgical History  Procedure Laterality Date  . Tonsillectomy    . Appendectomy    . Glaucoma surgery Left     "put in stent for glaucoma" (04/06/2013)  . Cataract extraction, bilateral Bilateral    Social History:  reports that she quit smoking about 29 years ago. Her smoking use included Cigarettes. She has a 15 pack-year smoking history. She has never used smokeless tobacco. She reports that she drinks about 1.2 ounces of alcohol per week. She reports that she does not use illicit drugs.  where does patient live--home  Allergies  Allergen Reactions  . Codeine Nausea Only    History reviewed. No pertinent family history.   Prior to Admission medications   Medication Sig Start Date End Date Taking? Authorizing Provider  b complex vitamins tablet Take 1 tablet by mouth daily.    Yes Historical Provider, MD  Calcium-Vitamin D 600-200 MG-UNIT per tablet Take 1 tablet by mouth daily.    Yes Historical Provider, MD  docusate sodium (COLACE) 100 MG capsule Take 1 capsule (100 mg total) by mouth 2 (two) times daily. 09/29/12  Yes Raeford Razor, MD  hydroxypropyl methylcellulose (ISOPTO TEARS) 2.5 % ophthalmic solution Place 1  drop into the right eye 4 (four) times daily.    Yes Historical Provider, MD  metoprolol tartrate (LOPRESSOR) 25 MG tablet Take 1 tablet (25 mg total) by mouth daily. May take 1/2 to 1 tablet as needed for heart irregularity 08/11/12  Yes Rollene Rotunda, MD  Multiple Vitamins-Minerals (ICAPS PO) Take 2 tablets by mouth daily.    Yes Historical Provider, MD  polyethylene glycol (MIRALAX / GLYCOLAX) packet Take 17 g by mouth daily.   Yes Historical Provider, MD  sodium chloride (MURO 128) 5 %  ophthalmic ointment Place 1 drop into the left eye daily.   Yes Historical Provider, MD  tolterodine (DETROL) 2 MG tablet 2 mg 2 (two) times daily as needed. Take as needed for urinary frequency 01/08/12  Yes Margaree Mackintosh, MD  traMADol (ULTRAM) 50 MG tablet Take 50 mg by mouth every 8 (eight) hours as needed for pain.   Yes Historical Provider, MD  vitamin E 100 UNIT capsule Take 100 Units by mouth daily.     Yes Historical Provider, MD   Physical Exam: Filed Vitals:   04/06/13 1453 04/06/13 1530 04/06/13 1630 04/06/13 1718  BP: 114/66 113/45 130/86 166/63  Pulse: 119 65 68 88  Temp: 97.6 F (36.4 C)   97.7 F (36.5 C)  TempSrc: Oral   Oral  Resp: 20   18  Height:    5' 4.5" (1.638 m)  Weight:    56.246 kg (124 lb)  SpO2: 100% 97% 94% 96%   Constitutional: Vital signs reviewed.  Patient is a well-developed and well-nourished  in no acute distress and cooperative with exam. Alert and oriented x3.  Head: Normocephalic and atraumatic Nose: No erythema or drainage noted.   Mouth: no erythema or exudates, sightly dry MM Eyes: PERRL, EOMI, conjunctivae normal, No scleral icterus.  Neck: Supple, Trachea midline normal ROM, No JVD, mass, thyromegaly, or carotid bruit present.  Cardiovascular: RRR, S1 normal, S2 normal, 3/6 systolic murmur present, pulses symmetric and intact bilaterally Pulmonary/Chest: normal respiratory effort, decreased BS at bases with rhonchi in R. Lower lung. No wheezes Abdominal: Soft. Non-tender, non-distended, bowel sounds are normal, no masses, organomegaly, or guarding present.  GU: no CVA tenderness Extremites: No cyanosis and no edema Neurological: A&O x3, Strength is normal and symmetric bilaterally, cranial nerve II-XII are grossly intact, no focal motor deficit, sensory intact to light touch bilaterally.  Skin: Warm, dry and intact. No rash,.  Psychiatric: Normal mood and affect. speech and behavior is normal.   Labs on Admission:  Basic Metabolic  Panel:  Recent Labs Lab 04/06/13 1051  NA 138  K 4.3  CL 103  CO2 23  GLUCOSE 114*  BUN 32*  CREATININE 1.23*  CALCIUM 8.8   Liver Function Tests: No results found for this basename: AST, ALT, ALKPHOS, BILITOT, PROT, ALBUMIN,  in the last 168 hours No results found for this basename: LIPASE, AMYLASE,  in the last 168 hours No results found for this basename: AMMONIA,  in the last 168 hours CBC:  Recent Labs Lab 04/06/13 1051  WBC 3.6*  HGB 12.3  HCT 36.5  MCV 91.3  PLT 160   Cardiac Enzymes:  Recent Labs Lab 04/06/13 1051  TROPONINI <0.30    BNP (last 3 results) No results found for this basename: PROBNP,  in the last 8760 hours CBG: No results found for this basename: GLUCAP,  in the last 168 hours  Radiological Exams on Admission: Dg Chest 2 View  04/06/2013   *  RADIOLOGY REPORT*  Clinical Data: Tachycardia  CHEST - 2 VIEW  Comparison: 03/21/2012  Findings: Patchy right lower lobe opacity, atelectasis versus pneumonia.  Mild left basilar opacity, likely atelectasis. Increased interstitial markings, likely chronic.  No pleural effusion or pneumothorax.  Cardiomegaly.  Degenerative changes of the visualized thoracolumbar spine.  IMPRESSION: Mild patchy right lower lobe opacity, atelectasis versus pneumonia.   Original Report Authenticated By: Charline Bills, M.D.      Assessment/Plan Principal Problem:   CAP (community acquired pneumonia) -As discussed above, continue empiric abx with Rocephin and zithromax -blood cultures obtained in ED, follow Active Problems: Volume depletion/azotemia -hydration with IVF, follow and recheck Transient hypotension -secondary to infection vs hypovolemia -resolved with IVF, conitnue hydration as above -follow up on blood and urine cultures obtained in ED -also cycle CEs and follow HTN (hypertension) -hypotension resolved as above, will resume metoprolol with hold parameters and follow  H/O SVT -continue metoprolol as  above, follow    Code Status:full Family Communication: daughter -in-law at bedside Disposition Plan: admit to tele, likely back to independent living facility when medically ready.  Time spent: >11mins  Kela Millin [facility] Pager [phone]  If 7PM-7AM, please contact night-coverage [email or web address] Password [facility] 04/06/2013, 7:49 PM

## 2013-04-06 NOTE — ED Provider Notes (Signed)
Medical screening examination/treatment/procedure(s) were conducted as a shared visit with non-physician practitioner(s) and myself.  I personally evaluated the patient during the encounter.  79 year old female has nonspecific vague generalized fatigue versus weakness today without specific fever cough chest pain shortness breath or palpitations without lightheadedness or presyncope syncope or vertigo and without obvious focal neurologic symptoms but has resting tachycardia at approximately 120 beats per minute as well as an abnormal chest x-ray with apparent infiltrate at the right base in the cannot rule out CAP with or without mild SVT.  Hurman Horn, MD 04/06/13 2252

## 2013-04-07 DIAGNOSIS — J189 Pneumonia, unspecified organism: Secondary | ICD-10-CM | POA: Diagnosis not present

## 2013-04-07 DIAGNOSIS — E869 Volume depletion, unspecified: Secondary | ICD-10-CM | POA: Diagnosis not present

## 2013-04-07 DIAGNOSIS — I498 Other specified cardiac arrhythmias: Secondary | ICD-10-CM

## 2013-04-07 LAB — CBC
HCT: 32.2 % — ABNORMAL LOW (ref 36.0–46.0)
MCH: 30.2 pg (ref 26.0–34.0)
MCHC: 33.2 g/dL (ref 30.0–36.0)
MCV: 91 fL (ref 78.0–100.0)
RDW: 14.9 % (ref 11.5–15.5)

## 2013-04-07 LAB — URINALYSIS, ROUTINE W REFLEX MICROSCOPIC
Bilirubin Urine: NEGATIVE
Nitrite: NEGATIVE
Protein, ur: NEGATIVE mg/dL
Specific Gravity, Urine: 1.009 (ref 1.005–1.030)
Urobilinogen, UA: 0.2 mg/dL (ref 0.0–1.0)

## 2013-04-07 LAB — BASIC METABOLIC PANEL
BUN: 23 mg/dL (ref 6–23)
CO2: 22 mEq/L (ref 19–32)
Chloride: 111 mEq/L (ref 96–112)
Creatinine, Ser: 1.04 mg/dL (ref 0.50–1.10)
GFR calc Af Amer: 52 mL/min — ABNORMAL LOW (ref >90)
Glucose, Bld: 95 mg/dL (ref 70–99)

## 2013-04-07 LAB — TROPONIN I: Troponin I: <0.3 ng/mL (ref <0.30)

## 2013-04-07 MED ORDER — LEVOFLOXACIN 500 MG PO TABS: 500.0000 mg | ORAL_TABLET | Freq: Every day | ORAL | Status: DC

## 2013-04-07 NOTE — Care Management Note (Signed)
    Page 1 of 1   04/07/2013     11:06:46 AM   CARE MANAGEMENT NOTE 04/07/2013  Patient:  Brittany Warren, Brittany Warren   Account Number:  [id number]  Date Initiated:  04/07/2013  Documentation initiated by:  Letha Cape  Subjective/Objective Assessment:   dx ?cap, atx  admit- From [facility] ? indep or ALF     Action/Plan:   Anticipated DC Date:  04/07/2013   Anticipated DC Plan:  ASSISTED LIVING / REST HOME  In-house referral  Clinical Social Worker      DC Planning Services  CM consult      Choice offered to / List presented to:             Status of service:  Completed, signed off Medicare Important Message given?   (If response is "NO", the following Medicare IM given date fields will be blank) Date Medicare IM given:   Date Additional Medicare IM given:    Discharge Disposition:  ASSISTED LIVING  Per UR Regulation:  Reviewed for med. necessity/level of care/duration of stay  If discussed at Long Length of Stay Meetings, dates discussed:    Comments:  04/07/13 11:04 Letha Cape RN, BSN [phone] patient is from [facility] , not sure if it is ALF or Indep Living.  Patient is for dc today, CSW referral.

## 2013-04-07 NOTE — Progress Notes (Signed)
Pt. discharged to floor,verbalized understanding of discharged instruction,medication,restriction,diet and follow up appointment.Baseline Vitals sign stable,Pt comfortable,no sign and symptom of distress. 

## 2013-04-07 NOTE — Discharge Summary (Signed)
Physician Discharge Summary  Brittany Warren ZOX:[id number] DOB: 07-26-1917 DOA: 04/06/2013  PCP: Kimber Relic, MD  Admit date: 04/06/2013 Discharge date: 04/07/2013  Time spent: 40 minutes  Recommendations for Outpatient Follow-up:  1. Followup with primary care physician within one week. 2. Followup with her cardiologist in 3-4 days  Discharge Diagnoses:  Principal Problem:   CAP (community acquired pneumonia) Active Problems:   HTN (hypertension)   Volume depletion   Transient hypotension   Discharge Condition: Stable  Diet recommendation: Heart healthy diet  Filed Weights   04/06/13 1718  Weight: 56.246 kg (124 lb)    History of present illness:  SINIYA LICHTY is a 79 y.o. female resident of independent living at [facility] with PMH as listed below who presents with the above c/o. She states that when she woke up this am she 'just did not feel right'- decreased energy, felt like her stomach was upset(but no pain) and did not feel like eating breakfast. So she took her metoprolol, sat down and seemed to feel better but subsequently still was not feeling like her usual self so she called the nurse, who checked her BP ant it was low so she asked her to come to the ED. At the time of my interview pt denies having had any dizziness or nausea(although documented in ED notes). She also denies cough, CP, SOB, dysuria, diarrhea, fever, diarrhea, and no melena or hematochezia. In the ED she was initially hypotensive with SBP in the 90s, and tachycardic. EKG showed junctional tachy at 119. She was started on IVF and given metoprolol with improvement in her HR to the 80S. CXR revealed Mild patchy right lower lobe opacity, atelectasis versus pneumonia, with leukopenia. She was started on empiric abx and admission to [facility] requested for further eval and management.  Hospital Course:   1. Community acquired pneumonia: This is a questionable diagnosis, patient came into the hospital because she  didn't feel well which is probably palpitations. Blood cultures were obtained and this x-ray was done and showed questionable pneumonia in the right lower lobe. Patient did not have fever or chills she mentioned cough every now and then. Patient started on Rocephin and azithromycin, Levaquin was prescribed on discharge for 5 more days.  2. Dehydration: Patient has mild acute kidney injury with dehydration or volume depletion. Likely secondary to poor oral intake. Patient started on IV fluids for IV hydration, the very next day the creatinine improved from 1.23 to 1.0 and BUN from 30 to 23. Dehydration resolved.  3. Hypotension, transient: Patient had transient hypotension with systolic pressure in the 90s, this is likely the cause of the palpitations. It is likely secondary to dehydration and volume depletion. This is resolved her SBP now is in the 150s.  4. History of SVT: Patient is on metoprolol, she felt some palpitations and she said metoprolol did not help this time. Twelve-lead EKG showed junctional tachycardia, this is probably secondary to the dehydration and hypotension. This is resolved patient has normal sinus rhythm now with heart rate in the 60s to 70s. Cardiology consulted to see the patient, patient said she will not wait for them she wants to go back home. Cardiac enzymes negative.  Procedures:  None  Consultations:  None  Discharge Exam: Filed Vitals:   04/06/13 1630 04/06/13 1718 04/06/13 2213 04/07/13 0538  BP: 130/86 166/63 147/73 157/66  Pulse: 68 88 62 67  Temp:  97.7 F (36.5 C) 98.1 F (36.7 C) 98.1 F (36.7  C)  TempSrc:  Oral Oral Oral  Resp:  18 20 18   Height:  5' 4.5" (1.638 m)    Weight:  56.246 kg (124 lb)    SpO2: 94% 96% 93% 93%   General: Alert and awake, oriented x3, not in any acute distress. HEENT: anicteric sclera, pupils reactive to light and accommodation, EOMI CVS: S1-S2 clear, no murmur rubs or gallops Chest: clear to auscultation  bilaterally, no wheezing, rales or rhonchi Abdomen: soft nontender, nondistended, normal bowel sounds, no organomegaly Extremities: no cyanosis, clubbing or edema noted bilaterally Neuro: Cranial nerves II-XII intact, no focal neurological deficits  Discharge Instructions  Discharge Orders   Future Appointments Provider Department Dept Phone   04/12/2013 10:15 AM Rollene Rotunda, MD [facility] Main Office [place]) [phone]   04/13/2013 1:45 PM Man X Mast, NP [facility] [phone]   Future Orders Complete By Expires     Diet - low sodium heart healthy  As directed     Increase activity slowly  As directed         Medication List    TAKE these medications       b complex vitamins tablet  Take 1 tablet by mouth daily.     Calcium-Vitamin D 600-200 MG-UNIT per tablet  Take 1 tablet by mouth daily.     docusate sodium 100 MG capsule  Commonly known as:  COLACE  Take 1 capsule (100 mg total) by mouth 2 (two) times daily.     hydroxypropyl methylcellulose 2.5 % ophthalmic solution  Commonly known as:  ISOPTO TEARS  Place 1 drop into the right eye 4 (four) times daily.     ICAPS PO  Take 2 tablets by mouth daily.     levofloxacin 500 MG tablet  Commonly known as:  LEVAQUIN  Take 1 tablet (500 mg total) by mouth daily.     metoprolol tartrate 25 MG tablet  Commonly known as:  LOPRESSOR  Take 1 tablet (25 mg total) by mouth daily. May take 1/2 to 1 tablet as needed for heart irregularity     polyethylene glycol packet  Commonly known as:  MIRALAX / GLYCOLAX  Take 17 g by mouth daily.     sodium chloride 5 % ophthalmic ointment  Commonly known as:  MURO 128  Place 1 drop into the left eye daily.     tolterodine 2 MG tablet  Commonly known as:  DETROL  2 mg 2 (two) times daily as needed. Take as needed for urinary frequency     traMADol 50 MG tablet  Commonly known as:  ULTRAM  Take 50 mg by mouth every 8 (eight) hours as needed for pain.      vitamin E 100 UNIT capsule  Take 100 Units by mouth daily.       Allergies  Allergen Reactions  . Codeine Nausea Only       Follow-up Information   Follow up with GREEN, Lenon Curt, MD In 1 week.   Contact information:   [address] [place] [address] [phone]        The results of significant diagnostics from this hospitalization (including imaging, microbiology, ancillary and laboratory) are listed below for reference.    Significant Diagnostic Studies: Dg Chest 2 View  04/06/2013   *RADIOLOGY REPORT*  Clinical Data: Tachycardia  CHEST - 2 VIEW  Comparison: 03/21/2012  Findings: Patchy right lower lobe opacity, atelectasis versus pneumonia.  Mild left basilar opacity, likely atelectasis. Increased interstitial markings,  likely chronic.  No pleural effusion or pneumothorax.  Cardiomegaly.  Degenerative changes of the visualized thoracolumbar spine.  IMPRESSION: Mild patchy right lower lobe opacity, atelectasis versus pneumonia.   Original Report Authenticated By: Charline Bills, M.D.    Microbiology: Recent Results (from the past 240 hour(s))  CULTURE, BLOOD (ROUTINE X 2)     Status: None   Collection Time    04/06/13  2:11 PM      Result Value Range Status   Specimen Description BLOOD HAND RIGHT   Final   Special Requests BOTTLES DRAWN AEROBIC ONLY 6CC   Final   Culture  Setup Time 04/06/2013 21:11   Final   Culture     Final   Value:        BLOOD CULTURE RECEIVED NO GROWTH TO DATE CULTURE WILL BE HELD FOR 5 DAYS BEFORE ISSUING A FINAL NEGATIVE REPORT   Report Status PENDING   Incomplete  CULTURE, BLOOD (ROUTINE X 2)     Status: None   Collection Time    04/06/13  2:16 PM      Result Value Range Status   Specimen Description BLOOD ARM RIGHT   Final   Special Requests BOTTLES DRAWN AEROBIC AND ANAEROBIC 10CC   Final   Culture  Setup Time 04/06/2013 21:11   Final   Culture     Final   Value:        BLOOD CULTURE RECEIVED NO GROWTH TO DATE CULTURE WILL BE HELD  FOR 5 DAYS BEFORE ISSUING A FINAL NEGATIVE REPORT   Report Status PENDING   Incomplete     Labs: Basic Metabolic Panel:  Recent Labs Lab 04/06/13 1051 04/06/13 2104 04/07/13 0315  NA 138  --  141  K 4.3  --  4.1  CL 103  --  111  CO2 23  --  22  GLUCOSE 114*  --  95  BUN 32*  --  23  CREATININE 1.23* 1.22* 1.04  CALCIUM 8.8  --  8.0*   Liver Function Tests: No results found for this basename: AST, ALT, ALKPHOS, BILITOT, PROT, ALBUMIN,  in the last 168 hours No results found for this basename: LIPASE, AMYLASE,  in the last 168 hours No results found for this basename: AMMONIA,  in the last 168 hours CBC:  Recent Labs Lab 04/06/13 1051 04/06/13 2104 04/07/13 0315  WBC 3.6* 3.9* 4.1  HGB 12.3 10.9* 10.7*  HCT 36.5 32.9* 32.2*  MCV 91.3 91.4 91.0  PLT 160 161 152   Cardiac Enzymes:  Recent Labs Lab 04/06/13 1051 04/06/13 2104 04/07/13 0315  TROPONINI <0.30 <0.30 <0.30   BNP: BNP (last 3 results) No results found for this basename: PROBNP,  in the last 8760 hours CBG: No results found for this basename: GLUCAP,  in the last 168 hours     Signed:  Surafel Hilleary A  [facility] 04/07/2013, 11:28 AM

## 2013-04-08 LAB — URINE CULTURE

## 2013-04-10 ENCOUNTER — Encounter: Payer: Self-pay | Admitting: Internal Medicine

## 2013-04-10 ENCOUNTER — Telehealth: Payer: Self-pay | Admitting: *Deleted

## 2013-04-10 ENCOUNTER — Ambulatory Visit (INDEPENDENT_AMBULATORY_CARE_PROVIDER_SITE_OTHER): Payer: Medicare Other | Admitting: Internal Medicine

## 2013-04-10 VITALS — BP 118/62 | HR 82 | Temp 98.3°F | Resp 18 | Ht 64.0 in | Wt 124.0 lb

## 2013-04-10 DIAGNOSIS — E869 Volume depletion, unspecified: Secondary | ICD-10-CM

## 2013-04-10 DIAGNOSIS — J189 Pneumonia, unspecified organism: Secondary | ICD-10-CM

## 2013-04-10 DIAGNOSIS — J069 Acute upper respiratory infection, unspecified: Secondary | ICD-10-CM

## 2013-04-10 NOTE — Telephone Encounter (Signed)
New Problem:   While visiting patient for a head cold noticed that the patient's BP was 132/76 and her HR: was in the 140's but is now in the high 90's and would like to know if Dr. Antoine Poche could see her today.  Please call back.

## 2013-04-10 NOTE — Assessment & Plan Note (Addendum)
Completing levaquin therapy.  Continues to feel tired and c/o sore throat.  Seems pharyngitis may be due to viral infection in conjunction with her bacterial pneumonia.   She's been afebrile.

## 2013-04-10 NOTE — Telephone Encounter (Signed)
Spoke with [facility] - pt has an appointment with PCP today and an appointment with [facility] on Wednesday (p/hosp)  HR in the 90's now and no complaints.  Pt seeing PCP for headcold and will call back for an earlier appointment if any changes.

## 2013-04-10 NOTE — Progress Notes (Signed)
Patient ID: Brittany Warren, female   DOB: [birth year], 79 y.o.   MRN: [id number]  Allergies  Allergen Reactions  . Codeine Nausea Only    Chief Complaint  Patient presents with  . Acute Visit    cough and congestion since hospital stay last week     HPI: Patient is a 79 y.o. white female seen in the office today for acute visit with sore throat, cough and congestion.  No fever or chills.  Feels like nasal drip in back of throat.   Was in hospital with CAP and put on levofloxacin.  Sore throat started after left hospital after in ED with cold air blowing on her.  Had been well for so long.  Has a niece coming this upcoming weekend.  No headache, but typically does not get these.  Cough is nonproductive.  Has been trying to drink a lot of water.  No acid reflux problems.  No chest pain.  Hoarse and stopped up.    Review of Systems:  Review of Systems  Constitutional: Positive for malaise/fatigue. Negative for fever and chills.  HENT: Positive for congestion and sore throat.   Eyes: Negative for discharge.  Respiratory: Positive for cough. Negative for sputum production and shortness of breath.   Cardiovascular: Negative for chest pain.  Gastrointestinal: Negative for heartburn, abdominal pain and diarrhea.  Genitourinary: Positive for frequency. Negative for dysuria.  Musculoskeletal: Negative for falls.  Skin: Negative for rash.  Neurological: Positive for weakness. Negative for headaches.  Psychiatric/Behavioral: The patient has insomnia.        Just last night    Past Medical History  Diagnosis Date  . Glaucoma     "both eyes; no vision in left eye as a result" (04/06/2013)  . Anxiety     mild  . PVC (premature ventricular contraction)   . Osteoporosis   . History of recurrent UTIs   . Vision loss of left eye     "can't see at all out of it" (04/06/2013)  . SVT (supraventricular tachycardia)   . Aortic stenosis     a. Echo 03/2010:  EF 70-75%, mild AS  . Pneumonia 04/06/2013     "for the first time" (04/06/2013)   Past Surgical History  Procedure Laterality Date  . Tonsillectomy    . Appendectomy    . Glaucoma surgery Left     "put in stent for glaucoma" (04/06/2013)  . Cataract extraction, bilateral Bilateral    Social History:   reports that she quit smoking about 29 years ago. Her smoking use included Cigarettes. She has a 15 pack-year smoking history. She has never used smokeless tobacco. She reports that she drinks about 1.2 ounces of alcohol per week. She reports that she does not use illicit drugs.  No family history on file.  Medications: Patient's Medications  New Prescriptions   No medications on file  Previous Medications   B COMPLEX VITAMINS TABLET    Take 1 tablet by mouth daily.    CALCIUM-VITAMIN D 600-200 MG-UNIT PER TABLET    Take 1 tablet by mouth daily.    DOCUSATE SODIUM (COLACE) 100 MG CAPSULE    Take 1 capsule (100 mg total) by mouth 2 (two) times daily.   HYDROXYPROPYL METHYLCELLULOSE (ISOPTO TEARS) 2.5 % OPHTHALMIC SOLUTION    Place 1 drop into the right eye 4 (four) times daily.    LEVOFLOXACIN (LEVAQUIN) 500 MG TABLET    Take 1 tablet (500 mg total) by mouth daily.  METOPROLOL TARTRATE (LOPRESSOR) 25 MG TABLET    Take 1 tablet (25 mg total) by mouth daily. May take 1/2 to 1 tablet as needed for heart irregularity   MULTIPLE VITAMINS-MINERALS (ICAPS PO)    Take 2 tablets by mouth daily.    POLYETHYLENE GLYCOL (MIRALAX / GLYCOLAX) PACKET    Take 17 g by mouth daily.   SODIUM CHLORIDE (MURO 128) 5 % OPHTHALMIC OINTMENT    Place 1 drop into the left eye daily.   TOLTERODINE (DETROL) 2 MG TABLET    2 mg 2 (two) times daily as needed. Take as needed for urinary frequency   TRAMADOL (ULTRAM) 50 MG TABLET    Take 50 mg by mouth every 8 (eight) hours as needed for pain.   VITAMIN E 100 UNIT CAPSULE    Take 100 Units by mouth daily.    Modified Medications   No medications on file  Discontinued Medications   No medications on file      Physical Exam: Filed Vitals:   04/10/13 1338  BP: 118/62  Pulse: 82  Temp: 98.3 F (36.8 C)  TempSrc: Oral  Resp: 18  Height: 5\' 4"  (1.626 m)  Weight: 124 lb (56.246 kg)  SpO2: 95%  Physical Exam  Constitutional: No distress.  Thin caucasian female, NAD  HENT:  Head: Normocephalic and atraumatic.  Right Ear: External ear normal.  Left Ear: External ear normal.  Nose: Nose normal.  Mouth/Throat: Oropharynx is clear and moist. No oropharyngeal exudate.  Does have mild erythema, post-nasal drip HOH even with hearing aides  Eyes: Conjunctivae and EOM are normal. Pupils are equal, round, and reactive to light. Right eye exhibits no discharge. Left eye exhibits no discharge. No scleral icterus.  Neck:  No cervical adenopathy  Cardiovascular: Normal rate and regular rhythm.   Murmur heard. Pulmonary/Chest: Effort normal and breath sounds normal.  Abdominal: Soft. Bowel sounds are normal.  Musculoskeletal:  Ambulates with cane  Neurological: She is alert. No cranial nerve deficit.  Skin: She is not diaphoretic.  Psychiatric: She has a normal mood and affect.   Labs reviewed: Basic Metabolic Panel:  Recent Labs  16/10/96 1051 04/06/13 2104 04/07/13 0315  NA 138  --  141  K 4.3  --  4.1  CL 103  --  111  CO2 23  --  22  GLUCOSE 114*  --  95  BUN 32*  --  23  CREATININE 1.23* 1.22* 1.04  CALCIUM 8.8  --  8.0*  TSH  --  1.260  --   CBC:  Recent Labs  04/06/13 1051 04/06/13 2104 04/07/13 0315  WBC 3.6* 3.9* 4.1  HGB 12.3 10.9* 10.7*  HCT 36.5 32.9* 32.2*  MCV 91.3 91.4 91.0  PLT 160 161 152   Assessment/Plan CAP (community acquired pneumonia) Completing levaquin therapy.  Continues to feel tired and c/o sore throat.  Seems pharyngitis may be due to viral infection in conjunction with her bacterial pneumonia.   She's been afebrile.    Volume depletion Doing well with drinking fluids now.  Says she is tired of urinating.   Labs/tests ordered:  None;   Encouraged hydration, saline gargles, throat lozenges, rest;  Keep hospital f/u visit with Chipper Oman, NP on thursday

## 2013-04-10 NOTE — Assessment & Plan Note (Signed)
Doing well with drinking fluids now.  Says she is tired of urinating.

## 2013-04-10 NOTE — Patient Instructions (Signed)
Try salt water gargles for your sore throat or throat lozenges may help.  Continue to drink fluids.  Rest up for your visit from your niece.

## 2013-04-12 ENCOUNTER — Encounter: Payer: Self-pay | Admitting: Cardiology

## 2013-04-12 ENCOUNTER — Ambulatory Visit (INDEPENDENT_AMBULATORY_CARE_PROVIDER_SITE_OTHER): Payer: Medicare Other | Admitting: Cardiology

## 2013-04-12 VITALS — BP 120/70 | HR 72 | Ht 64.0 in | Wt 121.8 lb

## 2013-04-12 DIAGNOSIS — G8929 Other chronic pain: Secondary | ICD-10-CM

## 2013-04-12 DIAGNOSIS — M542 Cervicalgia: Secondary | ICD-10-CM | POA: Diagnosis not present

## 2013-04-12 DIAGNOSIS — I350 Nonrheumatic aortic (valve) stenosis: Secondary | ICD-10-CM

## 2013-04-12 DIAGNOSIS — I359 Nonrheumatic aortic valve disorder, unspecified: Secondary | ICD-10-CM | POA: Diagnosis not present

## 2013-04-12 LAB — CULTURE, BLOOD (ROUTINE X 2): Culture: NO GROWTH

## 2013-04-12 NOTE — Patient Instructions (Addendum)
The current medical regimen is effective;  continue present plan and medications.  Follow up in 1 year with Dr Hochrein.  You will receive a letter in the mail 2 months before you are due.  Please call us when you receive this letter to schedule your follow up appointment.  

## 2013-04-12 NOTE — Progress Notes (Signed)
HPI The patient presents for follow up of palpitations for which she has been on amiodarone. She was most recently seen a couple of days ago in the hospital for probable community-acquired pneumonia. She did have some palpitations. She was thought to have a junctional tachycardia.  She thinks that she caught a cold during that hospitalization.  She is not doing well. She's not having any fevers or chills. She's had no new cough. She's not having any further palpitations, presyncope or syncope. She has no chest pressure, neck or arm discomfort.  Allergies  Allergen Reactions  . Codeine Nausea Only    Current Outpatient Prescriptions  Medication Sig Dispense Refill  . b complex vitamins tablet Take 1 tablet by mouth daily.       . Calcium-Vitamin D 600-200 MG-UNIT per tablet Take 1 tablet by mouth daily.       Marland Kitchen docusate sodium (COLACE) 100 MG capsule Take 1 capsule (100 mg total) by mouth 2 (two) times daily.  30 capsule  0  . hydroxypropyl methylcellulose (ISOPTO TEARS) 2.5 % ophthalmic solution Place 1 drop into the right eye 4 (four) times daily.       Marland Kitchen levofloxacin (LEVAQUIN) 500 MG tablet Take 1 tablet (500 mg total) by mouth daily.  5 tablet  0  . metoprolol tartrate (LOPRESSOR) 25 MG tablet Take 1 tablet (25 mg total) by mouth daily. May take 1/2 to 1 tablet as needed for heart irregularity  60 tablet  11  . Multiple Vitamins-Minerals (ICAPS PO) Take 2 tablets by mouth daily.       . polyethylene glycol (MIRALAX / GLYCOLAX) packet Take 17 g by mouth daily.      . sodium chloride (MURO 128) 5 % ophthalmic ointment Place 1 drop into the left eye daily.      Marland Kitchen tolterodine (DETROL) 2 MG tablet 2 mg 2 (two) times daily as needed. Take as needed for urinary frequency      . traMADol (ULTRAM) 50 MG tablet Take 50 mg by mouth every 8 (eight) hours as needed for pain.      . vitamin E 100 UNIT capsule Take 100 Units by mouth daily.         No current facility-administered medications for  this visit.    Past Medical History  Diagnosis Date  . Glaucoma     "both eyes; no vision in left eye as a result" (04/06/2013)  . Anxiety     mild  . PVC (premature ventricular contraction)   . Osteoporosis   . History of recurrent UTIs   . Vision loss of left eye     "can't see at all out of it" (04/06/2013)  . SVT (supraventricular tachycardia)   . Aortic stenosis     a. Echo 03/2010:  EF 70-75%, mild AS  . Pneumonia 04/06/2013    "for the first time" (04/06/2013)    Past Surgical History  Procedure Laterality Date  . Tonsillectomy    . Appendectomy    . Glaucoma surgery Left     "put in stent for glaucoma" (04/06/2013)  . Cataract extraction, bilateral Bilateral     ROS:  As stated in the HPI and negative for all other systems.  PHYSICAL EXAM BP 120/70  Pulse 72  Ht 5\' 4"  (1.626 m)  Wt 121 lb 12.8 oz (55.248 kg)  BMI 20.9 kg/m2 GENERAL:  Well appearing, for age HEENT:  Pupils equal round and reactive, fundi not visualized, oral mucosa unremarkable  NECK:  No jugular venous distention, waveform within normal limits, carotid upstroke brisk and symmetric, no bruits, no thyromegaly LUNGS:  Clear to auscultation bilaterally BACK:  No CVA tenderness,lordosis CHEST:  Unremarkable HEART:  PMI not displaced or sustained,S1 and S2 within normal limits, no S3, no S4, no clicks, no rubs, systolic murmur mid peaking ABD:  Flat, positive bowel sounds normal in frequency in pitch, no bruits, no rebound, no guarding, no midline pulsatile mass, no hepatomegaly, no splenomegaly EXT:  2 plus pulses throughout, no edema, no cyanosis no clubbing   ASSESSMENT AND PLAN  AORTIC STENOSIS:  We are going to manage this conservatively. She has no acute symptoms. No change in therapy is indicated.  SVT:  I did review the hospital records including EKG and she had possibly accelerated junctional tachycardia. However, this was in the face of being fairly ill. This has resolved. I think she can  continue the beta blocker. No other change in therapy is indicated.  PNEUMONIA:  I suggested close follow up with Dr. Chilton Si and his team.  She also asked me to contact Dr. Lenord Fellers.  She was her primary provider for years.  I have done this.

## 2013-04-13 ENCOUNTER — Non-Acute Institutional Stay: Payer: Medicare Other | Admitting: Nurse Practitioner

## 2013-04-13 ENCOUNTER — Encounter: Payer: Self-pay | Admitting: Nurse Practitioner

## 2013-04-13 VITALS — BP 112/60 | HR 76 | Temp 97.4°F | Ht 64.0 in | Wt 122.0 lb

## 2013-04-13 DIAGNOSIS — M810 Age-related osteoporosis without current pathological fracture: Secondary | ICD-10-CM

## 2013-04-13 DIAGNOSIS — N318 Other neuromuscular dysfunction of bladder: Secondary | ICD-10-CM

## 2013-04-13 DIAGNOSIS — E869 Volume depletion, unspecified: Secondary | ICD-10-CM

## 2013-04-13 DIAGNOSIS — Z66 Do not resuscitate: Secondary | ICD-10-CM | POA: Diagnosis not present

## 2013-04-13 DIAGNOSIS — I10 Essential (primary) hypertension: Secondary | ICD-10-CM

## 2013-04-13 NOTE — Assessment & Plan Note (Addendum)
Doing well with drinking fluids now.  Says she is tired of urinating. Update BMP

## 2013-04-13 NOTE — Progress Notes (Signed)
Patient ID: Brittany Warren, female   DOB: [birth year], 79 y.o.   MRN: [id number] Code Status: DNR  Allergies  Allergen Reactions  . Codeine Nausea Only    Chief Complaint  Patient presents with  . Medical Managment of Chronic Issues    blood pressure, anxiety. Patient was in hospital 04/06/13 to 04/07/13 for CAP    HPI: Patient is a 79 y.o. female seen in the clinic at [facility] today for hospitalization for PNA, volume depletion, and hypotension. The patient presented to ED with c/o  'just did not feel right'- decreased energy, felt like her stomach was upset(but no pain) and did not feel like eating breakfast.In the ED she was initially hypotensive with SBP in the 90s, and tachycardic. EKG showed junctional tachy at 119. She was started on IVF and given metoprolol with improvement in her HR to the 80S. CXR revealed Mild patchy right lower lobe opacity, atelectasis versus pneumonia, with leukopenia. She has completed her Levaquin and stated she feels like herself today.   Problem List Items Addressed This Visit   Volume depletion (Chronic)     Doing well with drinking fluids now.  Says she is tired of urinating. Update BMP      Osteoporosis     Vit D level 22 12/27/12--takes Ca and D supplement daily    HTN (hypertension)     Controlled on Metoprolol     DNR (do not resuscitate) - Primary   Hypertonicity of bladder     Seems better with Detrol LA       Review of Systems:  Review of Systems  Constitutional: Positive for malaise/fatigue. Negative for fever and chills.  HENT: Negative for congestion and sore throat.   Eyes: Negative for discharge.  Respiratory: Positive for cough. Negative for sputum production and shortness of breath.   Cardiovascular: Negative for chest pain.  Gastrointestinal: Negative for heartburn, abdominal pain and diarrhea.  Genitourinary: Positive for frequency. Negative for dysuria.  Musculoskeletal: Negative for falls.  Skin: Negative for  rash.  Neurological: Positive for weakness. Negative for headaches.  Psychiatric/Behavioral: The patient has insomnia.        Just last night     Past Medical History  Diagnosis Date  . Glaucoma     "both eyes; no vision in left eye as a result" (04/06/2013)  . Anxiety     mild  . PVC (premature ventricular contraction)   . Osteoporosis   . History of recurrent UTIs   . Vision loss of left eye     "can't see at all out of it" (04/06/2013)  . SVT (supraventricular tachycardia)   . Aortic stenosis     a. Echo 03/2010:  EF 70-75%, mild AS  . Pneumonia 04/06/2013    "for the first time" (04/06/2013)   Past Surgical History  Procedure Laterality Date  . Tonsillectomy    . Appendectomy    . Glaucoma surgery Left     "put in stent for glaucoma" (04/06/2013)  . Cataract extraction, bilateral Bilateral    Social History:   reports that she quit smoking about 29 years ago. Her smoking use included Cigarettes. She has a 15 pack-year smoking history. She has never used smokeless tobacco. She reports that she drinks about 1.2 ounces of alcohol per week. She reports that she does not use illicit drugs.  No family history on file.  Medications: Patient's Medications  New Prescriptions   No medications on file  Previous Medications  B COMPLEX VITAMINS TABLET    Take 1 tablet by mouth daily.    CALCIUM-VITAMIN D 600-200 MG-UNIT PER TABLET    Take 1 tablet by mouth daily.    HYDROXYPROPYL METHYLCELLULOSE (ISOPTO TEARS) 2.5 % OPHTHALMIC SOLUTION    Place 1 drop into the right eye 4 (four) times daily.    METOPROLOL TARTRATE (LOPRESSOR) 25 MG TABLET    Take 1 tablet (25 mg total) by mouth daily. May take 1/2 to 1 tablet as needed for heart irregularity   MULTIPLE VITAMINS-MINERALS (ICAPS PO)    Take 2 tablets by mouth daily.    POLYETHYLENE GLYCOL (MIRALAX / GLYCOLAX) PACKET    Take 17 g by mouth daily.   SODIUM CHLORIDE (MURO 128) 5 % OPHTHALMIC OINTMENT    Place 1 drop into the left eye  daily.   TOLTERODINE (DETROL) 2 MG TABLET    2 mg 2 (two) times daily as needed. Take as needed for urinary frequency   VITAMIN E 100 UNIT CAPSULE    Take 100 Units by mouth daily.    Modified Medications   Modified Medication Previous Medication   DOCUSATE SODIUM (COLACE) 100 MG CAPSULE docusate sodium (COLACE) 100 MG capsule      Take 100 mg by mouth 2 (two) times daily. As needed    Take 1 capsule (100 mg total) by mouth 2 (two) times daily.  Discontinued Medications   LEVOFLOXACIN (LEVAQUIN) 500 MG TABLET    Take 1 tablet (500 mg total) by mouth daily.   TRAMADOL (ULTRAM) 50 MG TABLET    Take 50 mg by mouth every 8 (eight) hours as needed for pain.     Physical Exam: Physical Exam  Constitutional: No distress.  Thin caucasian female, NAD  HENT:  Head: Normocephalic and atraumatic.  Right Ear: External ear normal.  Left Ear: External ear normal.  Nose: Nose normal.  Mouth/Throat: Oropharynx is clear and moist. No oropharyngeal exudate.  Does have mild erythema, post-nasal drip HOH even with hearing aides  Eyes: Conjunctivae and EOM are normal. Pupils are equal, round, and reactive to light. Right eye exhibits no discharge. Left eye exhibits no discharge. No scleral icterus.  Neck:  No cervical adenopathy  Cardiovascular: Normal rate and regular rhythm.   Murmur heard.  Systolic murmur is present with a grade of 4/6  Pulmonary/Chest: Effort normal and breath sounds normal.  Abdominal: Soft. Bowel sounds are normal.  Musculoskeletal:  Ambulates with cane  Neurological: She is alert. No cranial nerve deficit.  Skin: She is not diaphoretic.  Psychiatric: She has a normal mood and affect.    Filed Vitals:   04/13/13 1421  BP: 112/60  Pulse: 76  Temp: 97.4 F (36.3 C)  TempSrc: Oral  Height: 5\' 4"  (1.626 m)  Weight: 122 lb (55.339 kg)      Labs reviewed: Basic Metabolic Panel:  Recent Labs  08/65/78 04/06/13 1051 04/06/13 2104 04/07/13 0315  NA 143 138  --   141  K 4.5 4.3  --  4.1  CL  --  103  --  111  CO2  --  23  --  22  GLUCOSE  --  114*  --  95  BUN 12 32*  --  23  CREATININE 1.0 1.23* 1.22* 1.04  CALCIUM  --  8.8  --  8.0*  TSH 3.34  --  1.260  --    Liver Function Tests:  Recent Labs  12/27/12  AST 19  ALT 12  ALKPHOS 90   No results found for this basename: LIPASE, AMYLASE,  in the last 8760 hours No results found for this basename: AMMONIA,  in the last 8760 hours CBC:  Recent Labs  04/06/13 1051 04/06/13 2104 04/07/13 0315  WBC 3.6* 3.9* 4.1  HGB 12.3 10.9* 10.7*  HCT 36.5 32.9* 32.2*  MCV 91.3 91.4 91.0  PLT 160 161 152   Lipid Panel:  Recent Labs  12/27/12  CHOL 224*  HDL 76*  LDLCALC 128  TRIG 99   Anemia Panel: No results found for this basename: FOLATE, IRON, VITAMINB12,  in the last 8760 hours  Past Procedures:  04/06/13 IMPRESSION:  Mild patchy right lower lobe opacity, atelectasis versus pneumonia.     Assessment/Plan Osteoporosis Vit D level 22 12/27/12--takes Ca and D supplement daily  HTN (hypertension) Controlled on Metoprolol   Hypertonicity of bladder Seems better with Detrol LA  Volume depletion Doing well with drinking fluids now.  Says she is tired of urinating. Update BMP      Family/ Staff Communication:   Goals of Care:  Labs/tests ordered: BMP

## 2013-04-13 NOTE — Assessment & Plan Note (Signed)
Vit D level 22 12/27/12--takes Ca and D supplement daily

## 2013-04-13 NOTE — Assessment & Plan Note (Signed)
Seems better with Detrol LA

## 2013-04-13 NOTE — Assessment & Plan Note (Signed)
Controlled on Metoprolol 

## 2013-04-18 DIAGNOSIS — J189 Pneumonia, unspecified organism: Secondary | ICD-10-CM | POA: Diagnosis not present

## 2013-04-18 DIAGNOSIS — I10 Essential (primary) hypertension: Secondary | ICD-10-CM | POA: Diagnosis not present

## 2013-05-01 DIAGNOSIS — H4011X0 Primary open-angle glaucoma, stage unspecified: Secondary | ICD-10-CM | POA: Diagnosis not present

## 2013-05-01 DIAGNOSIS — H409 Unspecified glaucoma: Secondary | ICD-10-CM | POA: Diagnosis not present

## 2013-05-06 ENCOUNTER — Other Ambulatory Visit: Payer: Self-pay | Admitting: Internal Medicine

## 2013-05-08 ENCOUNTER — Other Ambulatory Visit: Payer: Self-pay

## 2013-05-08 MED ORDER — TOLTERODINE TARTRATE 2 MG PO TABS: 2.0000 mg | ORAL_TABLET | Freq: Two times a day (BID) | ORAL | Status: DC

## 2013-05-11 ENCOUNTER — Encounter: Payer: Self-pay | Admitting: Nurse Practitioner

## 2013-05-11 ENCOUNTER — Non-Acute Institutional Stay: Payer: Medicare Other | Admitting: Nurse Practitioner

## 2013-05-11 VITALS — BP 132/64 | HR 82 | Ht 60.0 in | Wt 122.0 lb

## 2013-05-11 DIAGNOSIS — N318 Other neuromuscular dysfunction of bladder: Secondary | ICD-10-CM | POA: Diagnosis not present

## 2013-05-11 DIAGNOSIS — I10 Essential (primary) hypertension: Secondary | ICD-10-CM

## 2013-05-11 DIAGNOSIS — E869 Volume depletion, unspecified: Secondary | ICD-10-CM

## 2013-05-11 DIAGNOSIS — K5900 Constipation, unspecified: Secondary | ICD-10-CM

## 2013-05-11 NOTE — Assessment & Plan Note (Signed)
Controlled on Metoprolol 

## 2013-05-11 NOTE — Assessment & Plan Note (Signed)
Resolved

## 2013-05-11 NOTE — Progress Notes (Signed)
Patient ID: Brittany Warren, female   DOB: May 30, 1917, 79 y.o.   MRN: [id number]   Allergies  Allergen Reactions  . Codeine Nausea Only    Chief Complaint  Patient presents with  . Medical Managment of Chronic Issues    blood pressure    HPI: Patient is a 79 y.o. female seen in the clinic at [facility] today for evaluation of blood pressure, constipation, hypertonicity of bladder.  Problem List Items Addressed This Visit   HTN (hypertension)     Controlled on Metoprolol       Hypertonicity of bladder     Seems better with Detrol LA(the patient stated she only takes it when she is going somewhere) and no negative side effects experienced.       Unspecified constipation     Controlled on MiraLax daily and Colace 100mg  bid prn.     Volume depletion - Primary (Chronic)     Resolved.        Review of Systems:  Review of Systems  Constitutional: Negative for fever, chills and malaise/fatigue.  HENT: Negative for congestion and sore throat.   Eyes: Negative for discharge.  Respiratory: Negative for cough, sputum production and shortness of breath.   Cardiovascular: Negative for chest pain.  Gastrointestinal: Negative for heartburn, abdominal pain and diarrhea.  Genitourinary: Positive for frequency. Negative for dysuria.  Musculoskeletal: Negative for falls.  Skin: Negative for rash.  Neurological: Negative for weakness and headaches.  Psychiatric/Behavioral: The patient has insomnia.        Just last night     Past Medical History  Diagnosis Date  . Glaucoma     "both eyes; no vision in left eye as a result" (04/06/2013)  . Anxiety     mild  . PVC (premature ventricular contraction)   . Osteoporosis   . History of recurrent UTIs   . Vision loss of left eye     "can't see at all out of it" (04/06/2013)  . SVT (supraventricular tachycardia)   . Aortic stenosis     a. Echo 03/2010:  EF 70-75%, mild AS  . Pneumonia 04/06/2013    "for the first time"  (04/06/2013)   Past Surgical History  Procedure Laterality Date  . Tonsillectomy    . Appendectomy    . Glaucoma surgery Left     "put in stent for glaucoma" (04/06/2013)  . Cataract extraction, bilateral Bilateral    Social History:   reports that she quit smoking about 29 years ago. Her smoking use included Cigarettes. She has a 15 pack-year smoking history. She has never used smokeless tobacco. She reports that she drinks about 1.2 ounces of alcohol per week. She reports that she does not use illicit drugs.  No family history on file.  Medications: Patient's Medications  New Prescriptions   No medications on file  Previous Medications   B COMPLEX VITAMINS TABLET    Take 1 tablet by mouth daily.    CALCIUM-VITAMIN D 600-200 MG-UNIT PER TABLET    Take 1 tablet by mouth daily.    DOCUSATE SODIUM (COLACE) 100 MG CAPSULE    Take 100 mg by mouth 2 (two) times daily. As needed   HYDROXYPROPYL METHYLCELLULOSE (ISOPTO TEARS) 2.5 % OPHTHALMIC SOLUTION    Place 1 drop into the right eye 4 (four) times daily.    METOPROLOL TARTRATE (LOPRESSOR) 25 MG TABLET    Take 1 tablet (25 mg total) by mouth daily. May take 1/2 to 1 tablet  as needed for heart irregularity   MULTIPLE VITAMINS-MINERALS (ICAPS PO)    Take 2 tablets by mouth daily.    POLYETHYLENE GLYCOL (MIRALAX / GLYCOLAX) PACKET    Take 17 g by mouth daily.   SODIUM CHLORIDE (MURO 128) 5 % OPHTHALMIC OINTMENT    Place 1 drop into the left eye daily.   TOLTERODINE (DETROL) 2 MG TABLET    Take 1 tablet (2 mg total) by mouth 2 (two) times daily.   VITAMIN E 100 UNIT CAPSULE    Take 100 Units by mouth daily.    Modified Medications   No medications on file  Discontinued Medications   No medications on file     Physical Exam: Physical Exam  Constitutional: No distress.  Thin caucasian female, NAD  HENT:  Head: Normocephalic and atraumatic.  Right Ear: External ear normal.  Left Ear: External ear normal.  Nose: Nose normal.   Mouth/Throat: Oropharynx is clear and moist. No oropharyngeal exudate.  Does have mild erythema, post-nasal drip HOH even with hearing aides  Eyes: Conjunctivae and EOM are normal. Pupils are equal, round, and reactive to light. Right eye exhibits no discharge. Left eye exhibits no discharge. No scleral icterus.  Neck:  No cervical adenopathy  Cardiovascular: Normal rate and regular rhythm.   Murmur heard.  Systolic murmur is present with a grade of 4/6  Pulmonary/Chest: Effort normal and breath sounds normal.  Abdominal: Soft. Bowel sounds are normal.  Musculoskeletal:  Ambulates with cane  Neurological: She is alert. No cranial nerve deficit.  Skin: Skin is warm and dry. No rash noted. She is not diaphoretic. No erythema.  Psychiatric: She has a normal mood and affect.    Filed Vitals:   05/11/13 1429  BP: 132/64  Pulse: 82  Height: 5' (1.524 m)  Weight: 122 lb (55.339 kg)      Labs reviewed: Basic Metabolic Panel:  Recent Labs  16/10/96  04/06/13 1051 04/06/13 2104 04/07/13 0315 04/18/13  NA 143  --  138  --  141 141  K 4.5  --  4.3  --  4.1 4.5  CL  --   --  103  --  111  --   CO2  --   --  23  --  22  --   GLUCOSE  --   --  114*  --  95  --   BUN 12  --  32*  --  23 25*  CREATININE 1.0  < > 1.23* 1.22* 1.04 1.2*  CALCIUM  --   --  8.8  --  8.0*  --   TSH 3.34  --   --  1.260  --   --   < > = values in this interval not displayed. Liver Function Tests:  Recent Labs  12/27/12  AST 19  ALT 12  ALKPHOS 90   No results found for this basename: LIPASE, AMYLASE,  in the last 8760 hours No results found for this basename: AMMONIA,  in the last 8760 hours CBC:  Recent Labs  04/06/13 1051 04/06/13 2104 04/07/13 0315  WBC 3.6* 3.9* 4.1  HGB 12.3 10.9* 10.7*  HCT 36.5 32.9* 32.2*  MCV 91.3 91.4 91.0  PLT 160 161 152   Lipid Panel:  Recent Labs  12/27/12  CHOL 224*  HDL 76*  LDLCALC 128  TRIG 99       Assessment/Plan Volume  depletion Resolved.   HTN (hypertension) Controlled on Metoprolol  Hypertonicity of bladder Seems better with Detrol LA(the patient stated she only takes it when she is going somewhere) and no negative side effects experienced.     Unspecified constipation Controlled on MiraLax daily and Colace 100mg  bid prn.     Family/ Staff Communication: none today  Goals of Care: [place]  Labs/tests ordered: none.

## 2013-05-11 NOTE — Assessment & Plan Note (Signed)
Controlled on MiraLax daily and Colace 100mg bid prn.    

## 2013-05-11 NOTE — Assessment & Plan Note (Signed)
Seems better with Detrol LA(the patient stated she only takes it when she is going somewhere) and no negative side effects experienced.

## 2013-05-17 DIAGNOSIS — H348392 Tributary (branch) retinal vein occlusion, unspecified eye, stable: Secondary | ICD-10-CM | POA: Diagnosis not present

## 2013-05-17 DIAGNOSIS — H31019 Macula scars of posterior pole (postinflammatory) (post-traumatic), unspecified eye: Secondary | ICD-10-CM | POA: Diagnosis not present

## 2013-05-17 DIAGNOSIS — H04129 Dry eye syndrome of unspecified lacrimal gland: Secondary | ICD-10-CM | POA: Diagnosis not present

## 2013-05-17 DIAGNOSIS — H353290 Exudative age-related macular degeneration, unspecified eye, stage unspecified: Secondary | ICD-10-CM | POA: Diagnosis not present

## 2013-05-18 DIAGNOSIS — H4050X0 Glaucoma secondary to other eye disorders, unspecified eye, stage unspecified: Secondary | ICD-10-CM | POA: Diagnosis not present

## 2013-05-18 DIAGNOSIS — M6281 Muscle weakness (generalized): Secondary | ICD-10-CM | POA: Diagnosis not present

## 2013-05-19 DIAGNOSIS — Q132 Other congenital malformations of iris: Secondary | ICD-10-CM | POA: Diagnosis not present

## 2013-05-19 DIAGNOSIS — M6281 Muscle weakness (generalized): Secondary | ICD-10-CM | POA: Diagnosis not present

## 2013-05-23 DIAGNOSIS — H4050X0 Glaucoma secondary to other eye disorders, unspecified eye, stage unspecified: Secondary | ICD-10-CM | POA: Diagnosis not present

## 2013-05-23 DIAGNOSIS — M6281 Muscle weakness (generalized): Secondary | ICD-10-CM | POA: Diagnosis not present

## 2013-05-25 DIAGNOSIS — M6281 Muscle weakness (generalized): Secondary | ICD-10-CM | POA: Diagnosis not present

## 2013-05-25 DIAGNOSIS — H4050X0 Glaucoma secondary to other eye disorders, unspecified eye, stage unspecified: Secondary | ICD-10-CM | POA: Diagnosis not present

## 2013-06-01 ENCOUNTER — Encounter: Payer: Self-pay | Admitting: Internal Medicine

## 2013-06-01 ENCOUNTER — Non-Acute Institutional Stay: Payer: Medicare Other | Admitting: Internal Medicine

## 2013-06-01 VITALS — BP 118/66 | HR 76 | Temp 97.7°F | Ht 64.0 in | Wt 123.0 lb

## 2013-06-01 DIAGNOSIS — S90829A Blister (nonthermal), unspecified foot, initial encounter: Secondary | ICD-10-CM | POA: Insufficient documentation

## 2013-06-01 DIAGNOSIS — S90821A Blister (nonthermal), right foot, initial encounter: Secondary | ICD-10-CM

## 2013-06-01 DIAGNOSIS — IMO0002 Reserved for concepts with insufficient information to code with codable children: Secondary | ICD-10-CM | POA: Diagnosis not present

## 2013-06-01 MED ORDER — SILVER SULFADIAZINE 1 % EX CREA: TOPICAL_CREAM | CUTANEOUS | Status: DC

## 2013-06-01 NOTE — Patient Instructions (Addendum)
Apply cream to right foot blistered area daily after soap anf water cleansing.

## 2013-06-02 NOTE — Progress Notes (Signed)
  Subjective:    Patient ID: Brittany Warren, female    DOB: 04/08/1917, 79 y.o.   MRN: [id number]  HPI Blister on the top of the right foot for about 2 weeks. Iniitially seemed secondary to poorly fitting sandals that she wore too long. Blister continues to enlarge. It is not particularly painful. She denies fever or chills.   Review of Systems New blister on foot as noted above.    Objective:   Physical Exam 2 x 1.5 inch tensely swollen blister on the r.ght foot dorsum at distal metatarsals.       Assessment & Plan:  Blister of foot, right, initial encounter   Cleansed with alcohol. Tense blister skin was removed with scalpel. No sign of infection. Blister fluid was clear and yellow.   - Plan: silver sulfADIAZINE (SILVADENE) 1 % cream

## 2013-06-15 ENCOUNTER — Non-Acute Institutional Stay: Payer: Medicare Other | Admitting: Internal Medicine

## 2013-06-15 ENCOUNTER — Encounter: Payer: Self-pay | Admitting: Internal Medicine

## 2013-06-15 VITALS — BP 126/64 | HR 80 | Ht 64.0 in | Wt 121.0 lb

## 2013-06-15 DIAGNOSIS — S90821D Blister (nonthermal), right foot, subsequent encounter: Secondary | ICD-10-CM

## 2013-06-15 DIAGNOSIS — Z5189 Encounter for other specified aftercare: Secondary | ICD-10-CM

## 2013-06-15 DIAGNOSIS — I10 Essential (primary) hypertension: Secondary | ICD-10-CM

## 2013-06-15 NOTE — Patient Instructions (Signed)
Continue current medications. 

## 2013-06-15 NOTE — Progress Notes (Signed)
  Subjective:    Patient ID: Brittany Warren, female    DOB: May 04, 1917, 79 y.o.   MRN: [id number]  HPI  Blister of foot, right, subsequent encounter:Completely healed.  This wound hasdone far better than I  thought it might.  HTN (hypertension): Controlled    Review of Systems  Constitutional: Negative.   HENT: Negative.   Eyes: Negative.   Respiratory: Negative.   Cardiovascular: Negative for chest pain, palpitations and leg swelling.  Gastrointestinal: Negative.   Endocrine: Negative.   Genitourinary: Negative.   Musculoskeletal: Negative.   Skin:       Blister is fully healed without significant residual skin damage.  Neurological: Negative.        Forgetful.  Hematological: Negative.   Psychiatric/Behavioral: Negative.        Objective:   Physical Exam  Constitutional: She is oriented to person, place, and time. She appears well-developed and well-nourished. No distress.  HENT:  Head: Normocephalic and atraumatic.  Right Ear: External ear normal.  Left Ear: External ear normal.  Eyes: Conjunctivae and EOM are normal. Pupils are equal, round, and reactive to light.  Corrective lenses   Neck: Neck supple. No JVD present. No tracheal deviation present. No thyromegaly present.  Cardiovascular: Normal rate, regular rhythm, normal heart sounds and intact distal pulses.  Exam reveals no gallop and no friction rub.   No murmur heard. Pulmonary/Chest: Breath sounds normal. No respiratory distress. She has no wheezes. She exhibits no tenderness.  Abdominal: Soft. Bowel sounds are normal. She exhibits no distension. There is no tenderness.  Musculoskeletal: Normal range of motion. She exhibits no edema and no tenderness.  Lymphadenopathy:    She has no cervical adenopathy.  Neurological: She is alert and oriented to person, place, and time. She has normal reflexes. No cranial nerve deficit. Coordination normal.  mmild memory deficit  Skin: No rash noted. No erythema. No  pallor.  Healed blister of the right foot  Psychiatric: She has a normal mood and affect. Her behavior is normal. Thought content normal.          Assessment & Plan:  Blister of foot, right, subsequent encounter: healed  HTN (hypertension): controlled

## 2013-06-16 DIAGNOSIS — H18469 Peripheral corneal degeneration, unspecified eye: Secondary | ICD-10-CM | POA: Diagnosis not present

## 2013-09-18 DIAGNOSIS — H409 Unspecified glaucoma: Secondary | ICD-10-CM | POA: Diagnosis not present

## 2013-09-20 ENCOUNTER — Other Ambulatory Visit: Payer: Self-pay

## 2013-09-20 DIAGNOSIS — I359 Nonrheumatic aortic valve disorder, unspecified: Secondary | ICD-10-CM

## 2013-09-20 DIAGNOSIS — I10 Essential (primary) hypertension: Secondary | ICD-10-CM

## 2013-09-20 DIAGNOSIS — R002 Palpitations: Secondary | ICD-10-CM

## 2013-09-20 MED ORDER — METOPROLOL TARTRATE 25 MG PO TABS: 25.0000 mg | ORAL_TABLET | Freq: Every day | ORAL | Status: DC

## 2013-11-14 DIAGNOSIS — H4011X0 Primary open-angle glaucoma, stage unspecified: Secondary | ICD-10-CM | POA: Diagnosis not present

## 2013-11-14 DIAGNOSIS — H353290 Exudative age-related macular degeneration, unspecified eye, stage unspecified: Secondary | ICD-10-CM | POA: Diagnosis not present

## 2013-11-14 DIAGNOSIS — H31019 Macula scars of posterior pole (postinflammatory) (post-traumatic), unspecified eye: Secondary | ICD-10-CM | POA: Diagnosis not present

## 2013-11-14 DIAGNOSIS — H353190 Nonexudative age-related macular degeneration, unspecified eye, stage unspecified: Secondary | ICD-10-CM | POA: Diagnosis not present

## 2013-11-23 ENCOUNTER — Encounter: Payer: Medicare Other | Admitting: Nurse Practitioner

## 2013-11-23 NOTE — Assessment & Plan Note (Signed)
Controlled on Metoprolol 

## 2013-11-23 NOTE — Assessment & Plan Note (Signed)
Controlled on MiraLax daily and Colace 100mg  bid prn.

## 2013-11-23 NOTE — Progress Notes (Signed)
Patient ID: Brittany Warren, female   DOB: August 17, 1917, 80 y.o.   MRN: [id number] Patient ID: Brittany Warren, female   DOB: 1917-05-27, 80 y.o.   MRN: [id number]   Allergies  Allergen Reactions  . Codeine Nausea Only    No chief complaint on file.   HPI: Patient is a 80 y.o. female seen in the clinic at [facility] today for evaluation of blood pressure, constipation, hypertonicity of bladder.  Problem List Items Addressed This Visit   None      Review of Systems:  Review of Systems  Constitutional: Negative for fever, chills and malaise/fatigue.  HENT: Negative for congestion and sore throat.   Eyes: Negative for discharge.  Respiratory: Negative for cough, sputum production and shortness of breath.   Cardiovascular: Negative for chest pain.  Gastrointestinal: Negative for heartburn, abdominal pain and diarrhea.  Genitourinary: Positive for frequency. Negative for dysuria.  Musculoskeletal: Negative for falls.  Skin: Negative for rash.  Neurological: Negative for weakness and headaches.  Psychiatric/Behavioral: The patient has insomnia.        Just last night     Past Medical History  Diagnosis Date  . Glaucoma     "both eyes; no vision in left eye as a result" (04/06/2013)  . Anxiety     mild  . PVC (premature ventricular contraction)   . Osteoporosis   . History of recurrent UTIs   . Vision loss of left eye     "can't see at all out of it" (04/06/2013)  . SVT (supraventricular tachycardia)   . Aortic stenosis     a. Echo 03/2010:  EF 70-75%, mild AS  . Pneumonia 04/06/2013    "for the first time" (04/06/2013)   Past Surgical History  Procedure Laterality Date  . Tonsillectomy    . Appendectomy    . Glaucoma surgery Left     "put in stent for glaucoma" (04/06/2013)  . Cataract extraction, bilateral Bilateral    Social History:   reports that she quit smoking about 29 years ago. Her smoking use included Cigarettes. She has a 15 pack-year smoking history. She  has never used smokeless tobacco. She reports that she drinks about 1.2 ounces of alcohol per week. She reports that she does not use illicit drugs.  No family history on file.  Medications: Patient's Medications  New Prescriptions   No medications on file  Previous Medications   B COMPLEX VITAMINS TABLET    Take 1 tablet by mouth daily.    CALCIUM-VITAMIN D 600-200 MG-UNIT PER TABLET    Take 1 tablet by mouth daily.    DEXTRAN 70-HYPROMELLOSE (TEARS RENEWED) OPHTHALMIC SOLUTION    1 drop. One drop for four times in right eye   DOCUSATE SODIUM (COLACE) 100 MG CAPSULE    Take 100 mg by mouth 2 (two) times daily. As needed   HYDROXYPROPYL METHYLCELLULOSE (ISOPTO TEARS) 2.5 % OPHTHALMIC SOLUTION    Place 1 drop into the right eye 4 (four) times daily.    MAGNESIUM HYDROXIDE (MILK OF MAGNESIA) 400 MG/5ML SUSPENSION    Take by mouth. 2 tablespoon at bedtime for constipation   METOPROLOL TARTRATE (LOPRESSOR) 25 MG TABLET    Take 1 tablet (25 mg total) by mouth daily. May take 1/2 to 1 tablet as needed for heart irregularity   MULTIPLE VITAMINS-MINERALS (ICAPS PO)    Take 2 tablets by mouth daily.    SILVER SULFADIAZINE (SILVADENE) 1 % CREAM    Apply daily to  the area where you had a blister   SODIUM CHLORIDE (MURO 128) 5 % OPHTHALMIC OINTMENT    Place 1 drop into the left eye daily.   TOLTERODINE (DETROL) 2 MG TABLET    Take 1 tablet (2 mg total) by mouth 2 (two) times daily.   VITAMIN E 100 UNIT CAPSULE    Take 100 Units by mouth daily.    Modified Medications   No medications on file  Discontinued Medications   No medications on file     Physical Exam: Physical Exam  Constitutional: No distress.  Thin caucasian female, NAD  HENT:  Head: Normocephalic and atraumatic.  Right Ear: External ear normal.  Left Ear: External ear normal.  Nose: Nose normal.  Mouth/Throat: Oropharynx is clear and moist. No oropharyngeal exudate.  Does have mild erythema, post-nasal drip HOH even with  hearing aides  Eyes: Conjunctivae and EOM are normal. Pupils are equal, round, and reactive to light. Right eye exhibits no discharge. Left eye exhibits no discharge. No scleral icterus.  Neck:  No cervical adenopathy  Cardiovascular: Normal rate and regular rhythm.   Murmur heard.  Systolic murmur is present with a grade of 4/6  Pulmonary/Chest: Effort normal and breath sounds normal.  Abdominal: Soft. Bowel sounds are normal.  Musculoskeletal:  Ambulates with cane  Neurological: She is alert. No cranial nerve deficit.  Skin: Skin is warm and dry. No rash noted. She is not diaphoretic. No erythema.  Psychiatric: She has a normal mood and affect.    There were no vitals filed for this visit.    Labs reviewed: Basic Metabolic Panel:  Recent Labs  12/27/12  04/06/13 1051 04/06/13 2104 04/07/13 0315 04/18/13  NA 143  --  138  --  141 141  K 4.5  --  4.3  --  4.1 4.5  CL  --   --  103  --  111  --   CO2  --   --  23  --  22  --   GLUCOSE  --   --  114*  --  95  --   BUN 12  --  32*  --  23 25*  CREATININE 1.0  < > 1.23* 1.22* 1.04 1.2*  CALCIUM  --   --  8.8  --  8.0*  --   TSH 3.34  --   --  1.260  --   --   < > = values in this interval not displayed. Liver Function Tests:  Recent Labs  12/27/12  AST 19  ALT 12  ALKPHOS 90   No results found for this basename: LIPASE, AMYLASE,  in the last 8760 hours No results found for this basename: AMMONIA,  in the last 8760 hours CBC:  Recent Labs  04/06/13 1051 04/06/13 2104 04/07/13 0315  WBC 3.6* 3.9* 4.1  HGB 12.3 10.9* 10.7*  HCT 36.5 32.9* 32.2*  MCV 91.3 91.4 91.0  PLT 160 161 152   Lipid Panel:  Recent Labs  12/27/12  CHOL 224*  HDL 76*  LDLCALC 128  TRIG 99       Assessment/Plan No problem-specific assessment & plan notes found for this encounter.   Family/ Staff Communication: none today  Goals of Care: [place]  Labs/tests ordered: none.           This encounter was created in error -  please disregard.

## 2013-12-12 DIAGNOSIS — H409 Unspecified glaucoma: Secondary | ICD-10-CM | POA: Diagnosis not present

## 2013-12-12 DIAGNOSIS — H4011X0 Primary open-angle glaucoma, stage unspecified: Secondary | ICD-10-CM | POA: Diagnosis not present

## 2013-12-28 ENCOUNTER — Encounter: Payer: Medicare Other | Admitting: Internal Medicine

## 2014-01-01 DIAGNOSIS — H903 Sensorineural hearing loss, bilateral: Secondary | ICD-10-CM | POA: Diagnosis not present

## 2014-01-11 ENCOUNTER — Non-Acute Institutional Stay: Payer: Medicare Other | Admitting: Internal Medicine

## 2014-01-11 VITALS — BP 110/58 | HR 80 | Wt 123.0 lb

## 2014-01-11 DIAGNOSIS — F411 Generalized anxiety disorder: Secondary | ICD-10-CM | POA: Diagnosis not present

## 2014-01-11 DIAGNOSIS — I10 Essential (primary) hypertension: Secondary | ICD-10-CM

## 2014-01-11 DIAGNOSIS — N318 Other neuromuscular dysfunction of bladder: Secondary | ICD-10-CM

## 2014-01-11 HISTORY — DX: Generalized anxiety disorder: F41.1

## 2014-01-11 NOTE — Progress Notes (Signed)
Patient ID: Brittany Warren, female   DOB: 11/28/1916, 80 y.o.   MRN: [id number]    Location:  [facility]   Place of Service: Clinic (12)    Allergies  Allergen Reactions  . Codeine Nausea Only    No chief complaint on file.   HPI:   HTN (hypertension): controlled  Hypertonicity of bladder: wears pad for protection. Continues tolteradine most days.  Anxiety: controlled  She worries about her vision. Sees Dr. Renard Hamper and Dr. Manning Charity at [facility]. History of glaucoma.    Medications: Patient's Medications  New Prescriptions   No medications on file  Previous Medications   B COMPLEX VITAMINS TABLET    Take 1 tablet by mouth daily.    CALCIUM-VITAMIN D 600-200 MG-UNIT PER TABLET    Take 1 tablet by mouth daily.    DEXTRAN 70-HYPROMELLOSE (TEARS RENEWED) OPHTHALMIC SOLUTION    1 drop. One drop for four times in right eye   DOCUSATE SODIUM (COLACE) 100 MG CAPSULE    Take 100 mg by mouth 2 (two) times daily. As needed   HYDROXYPROPYL METHYLCELLULOSE (ISOPTO TEARS) 2.5 % OPHTHALMIC SOLUTION    Place 1 drop into the right eye 4 (four) times daily.    MAGNESIUM HYDROXIDE (MILK OF MAGNESIA) 400 MG/5ML SUSPENSION    Take by mouth. 2 tablespoon at bedtime for constipation   METOPROLOL TARTRATE (LOPRESSOR) 25 MG TABLET    Take 1 tablet (25 mg total) by mouth daily. May take 1/2 to 1 tablet as needed for heart irregularity   MULTIPLE VITAMINS-MINERALS (ICAPS PO)    Take 2 tablets by mouth daily.    SODIUM CHLORIDE (MURO 128) 5 % OPHTHALMIC OINTMENT    Place 1 drop into the left eye daily.   TOLTERODINE (DETROL) 2 MG TABLET    Take 1 tablet (2 mg total) by mouth 2 (two) times daily.  Modified Medications   No medications on file  Discontinued Medications   SILVER SULFADIAZINE (SILVADENE) 1 % CREAM    Apply daily to the area where you had a blister   VITAMIN E 100 UNIT CAPSULE    Take 100 Units by mouth daily.       Review of Systems  Constitutional: Negative.   HENT:  Negative.   Eyes:       Poor vision and glaucoma.  Respiratory: Negative.   Cardiovascular: Negative for chest pain, palpitations and leg swelling.  Gastrointestinal: Negative.   Endocrine: Negative.   Genitourinary: Negative.   Musculoskeletal: Negative.   Skin:       Blister is fully healed without significant residual skin damage.  Neurological: Negative.        Forgetful.  Hematological: Negative.   Psychiatric/Behavioral: Negative.     Filed Vitals:   01/11/14 1336  BP: 110/58  Pulse: 80  Weight: 123 lb (55.792 kg)   Physical Exam  Constitutional: She is oriented to person, place, and time. She appears well-developed and well-nourished. No distress.  HENT:  Head: Normocephalic and atraumatic.  Right Ear: External ear normal.  Left Ear: External ear normal.  Eyes: Conjunctivae and EOM are normal. Pupils are equal, round, and reactive to light.  Corrective lenses. Poor vision. Can only see light from left eye.   Neck: Neck supple. No JVD present. No tracheal deviation present. No thyromegaly present.  Cardiovascular: Normal rate, regular rhythm and intact distal pulses.  Exam reveals no gallop and no friction rub.   Murmur (2/6 aortic ejection murmur) heard. Pulmonary/Chest: Breath  sounds normal. No respiratory distress. She has no wheezes. She exhibits no tenderness.  Abdominal: Soft. Bowel sounds are normal. She exhibits no distension. There is no tenderness.  Musculoskeletal: Normal range of motion. She exhibits no edema and no tenderness.  Lymphadenopathy:    She has no cervical adenopathy.  Neurological: She is alert and oriented to person, place, and time. She has normal reflexes. No cranial nerve deficit. Coordination normal.  mmild memory deficit  Skin: No rash noted. No erythema. No pallor.  Healed blister of the right foot  Psychiatric: She has a normal mood and affect. Her behavior is normal. Thought content normal.     Labs reviewed: No visits with  results within 3 Month(s) from this visit. Latest known visit with results is:  Nursing Home on 05/11/2013  Component Date Value Ref Range Status  . Glucose 04/18/2013 97   Final  . BUN 04/18/2013 25* 4 - 21 mg/dL Final  . Creatinine 04/18/2013 1.2* 0.5 - 1.1 mg/dL Final  . Potassium 04/18/2013 4.5  3.4 - 5.3 mmol/L Final  . Sodium 04/18/2013 141  137 - 147 mmol/L Final      Assessment/Plan 1. HTN (hypertension) controlled  2. Hypertonicity of bladder controlled  3. Anxiety controlled

## 2014-01-14 ENCOUNTER — Emergency Department (HOSPITAL_COMMUNITY)
Admission: EM | Admit: 2014-01-14 | Discharge: 2014-01-14 | Disposition: A | Discharge status: Home / Self Care | Payer: Medicare Other | Attending: Emergency Medicine | Admitting: Emergency Medicine

## 2014-01-14 ENCOUNTER — Encounter (HOSPITAL_COMMUNITY): Payer: Self-pay | Admitting: Emergency Medicine

## 2014-01-14 DIAGNOSIS — H409 Unspecified glaucoma: Secondary | ICD-10-CM | POA: Diagnosis not present

## 2014-01-14 DIAGNOSIS — Z87440 Personal history of urinary (tract) infections: Secondary | ICD-10-CM | POA: Diagnosis not present

## 2014-01-14 DIAGNOSIS — Z87891 Personal history of nicotine dependence: Secondary | ICD-10-CM | POA: Diagnosis not present

## 2014-01-14 DIAGNOSIS — Z8701 Personal history of pneumonia (recurrent): Secondary | ICD-10-CM | POA: Diagnosis not present

## 2014-01-14 DIAGNOSIS — R011 Cardiac murmur, unspecified: Secondary | ICD-10-CM | POA: Insufficient documentation

## 2014-01-14 DIAGNOSIS — Z8659 Personal history of other mental and behavioral disorders: Secondary | ICD-10-CM | POA: Diagnosis not present

## 2014-01-14 DIAGNOSIS — I4949 Other premature depolarization: Secondary | ICD-10-CM | POA: Insufficient documentation

## 2014-01-14 DIAGNOSIS — R000 Tachycardia, unspecified: Secondary | ICD-10-CM | POA: Insufficient documentation

## 2014-01-14 DIAGNOSIS — Z7982 Long term (current) use of aspirin: Secondary | ICD-10-CM | POA: Diagnosis not present

## 2014-01-14 DIAGNOSIS — M810 Age-related osteoporosis without current pathological fracture: Secondary | ICD-10-CM | POA: Insufficient documentation

## 2014-01-14 DIAGNOSIS — I499 Cardiac arrhythmia, unspecified: Secondary | ICD-10-CM | POA: Diagnosis not present

## 2014-01-14 DIAGNOSIS — Z79899 Other long term (current) drug therapy: Secondary | ICD-10-CM | POA: Diagnosis not present

## 2014-01-14 DIAGNOSIS — I498 Other specified cardiac arrhythmias: Secondary | ICD-10-CM | POA: Diagnosis not present

## 2014-01-14 LAB — COMPREHENSIVE METABOLIC PANEL
ALBUMIN: 3.3 g/dL — AB (ref 3.5–5.2)
ALK PHOS: 90 U/L (ref 39–117)
ALT: 12 U/L (ref 0–35)
AST: 17 U/L (ref 0–37)
BUN: 29 mg/dL — ABNORMAL HIGH (ref 6–23)
CHLORIDE: 102 meq/L (ref 96–112)
CO2: 25 mEq/L (ref 19–32)
CREATININE: 1.12 mg/dL — AB (ref 0.50–1.10)
Calcium: 8.7 mg/dL (ref 8.4–10.5)
GFR calc Af Amer: 47 mL/min — ABNORMAL LOW (ref >90)
GFR calc non Af Amer: 41 mL/min — ABNORMAL LOW (ref >90)
Glucose, Bld: 119 mg/dL — ABNORMAL HIGH (ref 70–99)
POTASSIUM: 4.4 meq/L (ref 3.7–5.3)
Sodium: 138 mEq/L (ref 137–147)
Total Bilirubin: <0.2 mg/dL — ABNORMAL LOW (ref 0.3–1.2)
Total Protein: 6.2 g/dL (ref 6.0–8.3)

## 2014-01-14 LAB — CBC WITH DIFFERENTIAL/PLATELET
BASOS ABS: 0 10*3/uL (ref 0.0–0.1)
BASOS PCT: 0 % (ref 0–1)
Eosinophils Absolute: 0.1 10*3/uL (ref 0.0–0.7)
Eosinophils Relative: 2 % (ref 0–5)
HCT: 35.6 % — ABNORMAL LOW (ref 36.0–46.0)
Hemoglobin: 11.6 g/dL — ABNORMAL LOW (ref 12.0–15.0)
Lymphocytes Relative: 13 % (ref 12–46)
Lymphs Abs: 0.6 10*3/uL — ABNORMAL LOW (ref 0.7–4.0)
MCH: 30.4 pg (ref 26.0–34.0)
MCHC: 32.6 g/dL (ref 30.0–36.0)
MCV: 93.4 fL (ref 78.0–100.0)
MONOS PCT: 8 % (ref 3–12)
Monocytes Absolute: 0.4 10*3/uL (ref 0.1–1.0)
NEUTROS ABS: 3.7 10*3/uL (ref 1.7–7.7)
NEUTROS PCT: 77 % (ref 43–77)
PLATELETS: 192 10*3/uL (ref 150–400)
RBC: 3.81 MIL/uL — ABNORMAL LOW (ref 3.87–5.11)
RDW: 14.2 % (ref 11.5–15.5)
WBC: 4.8 10*3/uL (ref 4.0–10.5)

## 2014-01-14 LAB — I-STAT TROPONIN, ED: TROPONIN I, POC: 0.02 ng/mL (ref 0.00–0.08)

## 2014-01-14 NOTE — Discharge Instructions (Signed)
Nonspecific Tachycardia Tachycardia is a faster than normal heartbeat (more than 100 beats per minute). In adults, the heart normally beats between 60 and 100 times a minute. A fast heartbeat may be a normal response to exercise or stress. It does not necessarily mean that something is wrong. However, sometimes when your heart beats too fast it may not be able to pump enough blood to the rest of your body. This can result in chest pain, shortness of breath, dizziness, and even fainting. Nonspecific tachycardia means that the specific cause or pattern of your tachycardia is unknown. CAUSES  Tachycardia may be harmless or it may be due to a more serious underlying cause. Possible causes of tachycardia include:  Exercise or exertion.  Fever.  Pain or injury.  Infection.  Loss of body fluids (dehydration).  Overactive thyroid.  Lack of red blood cells (anemia).  Anxiety and stress.  Alcohol.  Caffeine.  Tobacco products.  Diet pills.  Illegal drugs.  Heart disease. SYMPTOMS  Rapid or irregular heartbeat (palpitations).  Suddenly feeling your heart beating (cardiac awareness).  Dizziness.  Tiredness (fatigue).  Shortness of breath.  Chest pain.  Nausea.  Fainting. DIAGNOSIS  Your caregiver will perform a physical exam and take your medical history. In some cases, a heart specialist (cardiologist) may be consulted. Your caregiver may also order:  Blood tests.  Electrocardiography. This test records the electrical activity of your heart.  A heart monitoring test. TREATMENT  Treatment will depend on the likely cause of your tachycardia. The goal is to treat the underlying cause of your tachycardia. Treatment methods may include:  Replacement of fluids or blood through an intravenous (IV) tube for moderate to severe dehydration or anemia.  New medicines or changes in your current medicines.  Diet and lifestyle changes.  Treatment for certain  infections.  Stress relief or relaxation methods. HOME CARE INSTRUCTIONS   Rest.  Drink enough fluids to keep your urine clear or pale yellow.  Do not smoke.  Avoid:  Caffeine.  Tobacco.  Alcohol.  Chocolate.  Stimulants such as over-the-counter diet pills or pills that help you stay awake.  Situations that cause anxiety or stress.  Illegal drugs such as marijuana, phencyclidine (PCP), and cocaine.  Only take medicine as directed by your caregiver.  Keep all follow-up appointments as directed by your caregiver. SEEK IMMEDIATE MEDICAL CARE IF:   You have pain in your chest, upper arms, jaw, or neck.  You become weak, dizzy, or feel faint.  You have palpitations that will not go away within one hour.  You vomit, have diarrhea, or pass blood in your stool.  Your skin is cool, pale, and wet.  You have shortness of breath, confusion, new weakness or numbness, change in speech or vision, change in swallowing or understanding, or other concerns. MAKE SURE YOU:   Understand these instructions.  Will watch your condition.  Will get help right away if you are not doing well or get worse. Document Released: 12/03/2004 Document Revised: 01/18/2012 Document Reviewed: 10/06/2011 [facility] Patient Information 2014 [place], [place].

## 2014-01-14 NOTE — ED Provider Notes (Signed)
CSN: [id number]     Arrival date & time 01/14/14  2151 History   First MD Initiated Contact with Patient 01/14/14 2231     Chief Complaint  Patient presents with  . Rapid Heartbeat      (Consider location/radiation/quality/duration/timing/severity/associated sxs/prior Treatment) HPI Currently asymptomatic 80 year old female has history of SVT felt fine 8:00 this evening when she had sudden onset of palpitations and the staff at her living facility checked her pulse it was 140s so she took a beta blocker as directed by cardiology previously and within about an hour she felt back to normal, she had no chest pain no shortness of breath no confusion no lightheadedness no syncope no focal neurologic symptoms feels fine now and wants to go home. Past Medical History  Diagnosis Date  . Glaucoma     "both eyes; no vision in left eye as a result" (04/06/2013)  . Anxiety     mild  . PVC (premature ventricular contraction)   . Osteoporosis   . History of recurrent UTIs   . Vision loss of left eye     "can't see at all out of it" (04/06/2013)  . SVT (supraventricular tachycardia)   . Aortic stenosis     a. Echo 03/2010:  EF 70-75%, mild AS  . Pneumonia 04/06/2013    "for the first time" (04/06/2013)   Past Surgical History  Procedure Laterality Date  . Tonsillectomy    . Appendectomy    . Glaucoma surgery Left     "put in stent for glaucoma" (04/06/2013)  . Cataract extraction, bilateral Bilateral    History reviewed. No pertinent family history. History  Substance Use Topics  . Smoking status: Former Smoker -- 0.50 packs/day for 30 years    Types: Cigarettes    Quit date: 01/14/1984  . Smokeless tobacco: Never Used  . Alcohol Use: 1.2 oz/week    2 Glasses of wine per week     Comment: 04/06/2013  "glass of wine 1-2X/week"   OB History   Grav Para Term Preterm Abortions TAB SAB Ect Mult Living                 Review of Systems 10 Systems reviewed and are negative for acute change  except as noted in the HPI.   Allergies  Codeine  Home Medications   Current Outpatient Rx  Name  Route  Sig  Dispense  Refill  . aspirin EC 81 MG tablet   Oral   Take 81 mg by mouth daily.         Marland Kitchen b complex vitamins tablet   Oral   Take 1 tablet by mouth daily.          . Calcium-Vitamin D 600-200 MG-UNIT per tablet   Oral   Take 1 tablet by mouth daily.          Marland Kitchen dextran 70-hypromellose (TEARS RENEWED) ophthalmic solution      1 drop. One drop for four times in right eye         . docusate sodium (COLACE) 100 MG capsule   Oral   Take 100 mg by mouth daily as needed for mild constipation. As needed         . hydroxypropyl methylcellulose (ISOPTO TEARS) 2.5 % ophthalmic solution   Right Eye   Place 1 drop into the right eye 4 (four) times daily.          . magnesium hydroxide (MILK OF MAGNESIA) 400 MG/5ML suspension  Oral   Take by mouth. 2 tablespoon at bedtime for constipation         . metoprolol tartrate (LOPRESSOR) 25 MG tablet   Oral   Take 1 tablet (25 mg total) by mouth daily. May take 1/2 to 1 tablet as needed for heart irregularity   60 tablet   11   . Multiple Vitamins-Minerals (ICAPS PO)   Oral   Take 2 tablets by mouth daily.          . sodium chloride (MURO 128) 5 % ophthalmic ointment   Left Eye   Place 1 drop into the left eye daily.         Marland Kitchen tolterodine (DETROL) 2 MG tablet   Oral   Take 1 tablet (2 mg total) by mouth 2 (two) times daily.   60 tablet   11    BP 131/67  Pulse 64  Temp(Src) 98.2 F (36.8 C) (Oral)  Resp 17  Ht 5\' 4"  (1.626 m)  Wt 122 lb (55.339 kg)  BMI 20.93 kg/m2  SpO2 94% Physical Exam  Nursing note and vitals reviewed. Constitutional:  Awake, alert, nontoxic appearance.  HENT:  Head: Atraumatic.  Eyes: Right eye exhibits no discharge. Left eye exhibits no discharge.  Neck: Neck supple.  Cardiovascular: Normal rate and regular rhythm.   Murmur heard. Pulmonary/Chest: Effort normal  and breath sounds normal. No respiratory distress. She has no wheezes. She has no rales. She exhibits no tenderness.  Abdominal: Soft. There is no tenderness. There is no rebound.  Musculoskeletal: She exhibits no edema and no tenderness.  Baseline ROM, no obvious new focal weakness.  Neurological:  Mental status and motor strength appears baseline for patient and situation.  Skin: No rash noted.  Psychiatric: She has a normal mood and affect.    ED Course  Procedures (including critical care time) Patient / Family / Caregiver informed of clinical course, understand medical decision-making process, and agree with plan. Labs Review Labs Reviewed  CBC WITH DIFFERENTIAL - Abnormal; Notable for the following:    RBC 3.81 (*)    Hemoglobin 11.6 (*)    HCT 35.6 (*)    Lymphs Abs 0.6 (*)    All other components within normal limits  COMPREHENSIVE METABOLIC PANEL - Abnormal; Notable for the following:    Glucose, Bld 119 (*)    BUN 29 (*)    Creatinine, Ser 1.12 (*)    Albumin 3.3 (*)    Total Bilirubin <0.2 (*)    GFR calc non Af Amer 41 (*)    GFR calc Af Amer 47 (*)    All other components within normal limits  I-STAT TROPOININ, ED   Imaging Review No results found.   EKG Interpretation   Date/Time:  Sunday January 14 2014 21:54:41 EDT Ventricular Rate:  72 PR Interval:  212 QRS Duration: 82 QT Interval:  422 QTC Calculation: 462 R Axis:   -162 Text Interpretation:  Right and left arm electrode reversal,  interpretation assumes no reversal Sinus rhythm Borderline prolonged PR  interval Left atrial enlargement Probable lateral infarct, age  indeterminate Probable anteroseptal infarct, recent Compared to previous  tracing T wave abnormality inferior and lateral leads similar to 2012  Confirmed by [facility]  MD, Jenny Reichmann ([id number]) on 01/14/2014 10:59:16 PM      MDM   Final diagnoses:  Tachycardia    I doubt any other EMC precluding discharge at this time including, but not  necessarily limited to the following:Vtach.  Babette Relic, MD 01/15/14 [phone]

## 2014-01-14 NOTE — ED Notes (Signed)
Old and New EKG given to Dr Stevie Kern

## 2014-01-14 NOTE — ED Notes (Signed)
Patient presents from [facility] stating her heart rate was in the 140's.  Staff checked her heart rate and stated that her heart rate was up.   Patient took Metoprolol 25mg  while still at the facility.  Upon arrival to room 10, heart rate was in the 70's.  States she had approximately 1/4 cup caffeinated coffee where she normally drinks decaf.

## 2014-01-19 DIAGNOSIS — H04129 Dry eye syndrome of unspecified lacrimal gland: Secondary | ICD-10-CM | POA: Diagnosis not present

## 2014-01-19 DIAGNOSIS — H1030 Unspecified acute conjunctivitis, unspecified eye: Secondary | ICD-10-CM | POA: Diagnosis not present

## 2014-01-22 ENCOUNTER — Telehealth: Payer: Self-pay | Admitting: Cardiology

## 2014-01-22 NOTE — Telephone Encounter (Signed)
Spoke with nurse at [facility] - pt's HR is ranging from 76 to 104 bpm.  States she went to the ED last week for this as well.  Advised pt has a RX to take Metoprolol 25 mg 1/2 to 1 tablet prn for heart irregularity.  Pt has a follow up appt already scheduled for next week.

## 2014-01-22 NOTE — Telephone Encounter (Signed)
New message     C/o chills, sweating, increased heart rate  101

## 2014-01-31 ENCOUNTER — Ambulatory Visit (INDEPENDENT_AMBULATORY_CARE_PROVIDER_SITE_OTHER): Payer: Medicare Other | Admitting: Physician Assistant

## 2014-01-31 ENCOUNTER — Encounter: Payer: Self-pay | Admitting: Physician Assistant

## 2014-01-31 VITALS — BP 140/68 | HR 59 | Ht 64.0 in | Wt 120.0 lb

## 2014-01-31 DIAGNOSIS — I471 Supraventricular tachycardia, unspecified: Secondary | ICD-10-CM

## 2014-01-31 DIAGNOSIS — I359 Nonrheumatic aortic valve disorder, unspecified: Secondary | ICD-10-CM

## 2014-01-31 DIAGNOSIS — I350 Nonrheumatic aortic (valve) stenosis: Secondary | ICD-10-CM

## 2014-01-31 DIAGNOSIS — R002 Palpitations: Secondary | ICD-10-CM

## 2014-01-31 DIAGNOSIS — I498 Other specified cardiac arrhythmias: Secondary | ICD-10-CM | POA: Diagnosis not present

## 2014-01-31 NOTE — Progress Notes (Signed)
[facility], [place], [place]  [address] Phone: [phone] Fax:  [phone]  Date:  01/31/2014   ID:  Brittany Warren, DOB 06-23-1917, MRN [id number]  PCP:  Estill Dooms, MD  Cardiologist:  Dr. Minus Breeding     History of Present Illness: Brittany Warren is a 80 y.o. female with a hx of palpitations and SVT.  She was seen in 2012 with an episode of either accelerated junctional rhythm vs ectopic ATach.  She had continued symptoms on beta blocker Rx. In 2013, Dr. Minus Breeding tried her on a low dose of Amiodarone, but she did not tolerate this.  She was admitted to the hospital in 03/2013 with CAP.  She had some junctional tachycardia during that admission.    She was recently seen in the ED for rapid palpitations.  Patient tells me that she awoke in the middle of the night with rapid palpitations. She has not had a recurrence of symptoms since that time. She denies chest pain or significant dyspnea on exertion. She denies orthopnea, PND or edema. She denies syncope.   Studies:  - Echo (03/25/10):  Mild LVH, EF 70-75% (hyperdynamic), Gr 1 DD, mild AS (mean 17 mmHg - exaggerated gradients due to hyperdynamic LV), severe MAC, mild LAE.      Recent Labs: 04/06/2013: TSH 1.260  01/14/2014: ALT 12; Creatinine 1.12*; Hemoglobin 11.6*; Potassium 4.4   Wt Readings from Last 3 Encounters:  01/31/14 120 lb (54.432 kg)  01/14/14 122 lb (55.339 kg)  01/11/14 123 lb (55.792 kg)     Past Medical History  Diagnosis Date  . Glaucoma     "both eyes; no vision in left eye as a result" (04/06/2013)  . Anxiety     mild  . PVC (premature ventricular contraction)   . Osteoporosis   . History of recurrent UTIs   . Vision loss of left eye     "can't see at all out of it" (04/06/2013)  . SVT (supraventricular tachycardia)   . Aortic stenosis     a. Echo 03/2010:  EF 70-75%, mild AS  . Pneumonia 04/06/2013    "for the first time" (04/06/2013)    Current Outpatient Prescriptions    Medication Sig Dispense Refill  . b complex vitamins tablet Take 1 tablet by mouth daily.       . Calcium-Vitamin D 600-200 MG-UNIT per tablet Take 1 tablet by mouth daily.       Marland Kitchen dextran 70-hypromellose (TEARS RENEWED) ophthalmic solution 1 drop. One drop for four times in right eye      . docusate sodium (COLACE) 100 MG capsule Take 100 mg by mouth daily as needed for mild constipation. As needed      . hydroxypropyl methylcellulose (ISOPTO TEARS) 2.5 % ophthalmic solution Place 1 drop into the right eye 4 (four) times daily.       . magnesium hydroxide (MILK OF MAGNESIA) 400 MG/5ML suspension Take by mouth. 2 tablespoon at bedtime for constipation      . metoprolol tartrate (LOPRESSOR) 25 MG tablet Take 1 tablet (25 mg total) by mouth daily. May take 1/2 to 1 tablet as needed for heart irregularity  60 tablet  11  . Multiple Vitamins-Minerals (ICAPS PO) Take 2 tablets by mouth daily.       . sodium chloride (MURO 128) 5 % ophthalmic ointment Place 1 drop into the left eye daily.      Marland Kitchen tolterodine (DETROL) 2 MG tablet  Take 1 tablet (2 mg total) by mouth 2 (two) times daily.  60 tablet  11  . [DISCONTINUED] tolterodine (DETROL) 2 MG tablet 2 mg 2 (two) times daily as needed. Take as needed for urinary frequency       No current facility-administered medications for this visit.    Allergies:   Codeine   Social History:  The patient  reports that she quit smoking about 30 years ago. Her smoking use included Cigarettes. She has a 15 pack-year smoking history. She has never used smokeless tobacco. She reports that she drinks about 1.2 ounces of alcohol per week. She reports that she does not use illicit drugs.   Family History:  The patient's family history is not on file.   ROS:  Please see the history of present illness.      All other systems reviewed and negative.   PHYSICAL EXAM: VS:  BP 140/68  Pulse 59  Ht 5\' 4"  (1.626 m)  Wt 120 lb (54.432 kg)  BMI 20.59 kg/m2 Well nourished,  well developed, in no acute distress HEENT: normal Neck: no JVD Cardiac:  normal S1, S2; RRR; 2/6 systolic ejection murmur heard best at the RUSB Lungs:  clear to auscultation bilaterally, no wheezing, rhonchi or rales Abd: soft, nontender, no hepatomegaly Ext: no edema Skin: warm and dry Neuro:  CNs 2-12 intact, no focal abnormalities noted  EKG:  Sinus bradycardia, HR 59, LAD, PAC, T wave inversions in 3, V3-V6, no significant change when compared to prior tracings     ASSESSMENT AND PLAN:  1. Palpitations: Her symptoms may have been related to recurrent SVT which has been documented with her in the past. However, I cannot rule out the possibility of atrial fibrillation. Her risk factors for stroke are significant enough that she would require anticoagulation therapy. We had a long discussion regarding whether or not to pursue an event monitor at this time. She would like to schedule this in the coming weeks. 2. Aortic Stenosis:  Asymptomatic. 3. SVT: Continue metoprolol. Arrange event monitor as noted. 4. Disposition: Follow up with Dr. Percival Spanish in 2-3 months.  Signed, Richardson Dopp, PA-C  01/31/2014 11:54 AM

## 2014-01-31 NOTE — Patient Instructions (Signed)
NO CHANGES WERE MADE WITH YOUR MEDICATIONS TODAY  Your physician has recommended that you wear an event monitor. Event monitors are medical devices that record the heart's electrical activity. Doctors most often [place] these monitors to diagnose arrhythmias. Arrhythmias are problems with the speed or rhythm of the heartbeat. The monitor is a small, portable device. You can wear one while you do your normal daily activities. This is usually used to diagnose what is causing palpitations/syncope (passing out).  Your physician recommends that you schedule a follow-up appointment in: 2-3 MONTHS WITH DR. HOCHREIN

## 2014-02-13 DIAGNOSIS — M6281 Muscle weakness (generalized): Secondary | ICD-10-CM | POA: Diagnosis not present

## 2014-02-13 DIAGNOSIS — R262 Difficulty in walking, not elsewhere classified: Secondary | ICD-10-CM | POA: Diagnosis not present

## 2014-02-16 DIAGNOSIS — R262 Difficulty in walking, not elsewhere classified: Secondary | ICD-10-CM | POA: Diagnosis not present

## 2014-02-16 DIAGNOSIS — M6281 Muscle weakness (generalized): Secondary | ICD-10-CM | POA: Diagnosis not present

## 2014-02-19 DIAGNOSIS — H409 Unspecified glaucoma: Secondary | ICD-10-CM | POA: Diagnosis not present

## 2014-02-20 DIAGNOSIS — R262 Difficulty in walking, not elsewhere classified: Secondary | ICD-10-CM | POA: Diagnosis not present

## 2014-02-20 DIAGNOSIS — M6281 Muscle weakness (generalized): Secondary | ICD-10-CM | POA: Diagnosis not present

## 2014-02-21 DIAGNOSIS — M6281 Muscle weakness (generalized): Secondary | ICD-10-CM | POA: Diagnosis not present

## 2014-02-21 DIAGNOSIS — R262 Difficulty in walking, not elsewhere classified: Secondary | ICD-10-CM | POA: Diagnosis not present

## 2014-02-22 DIAGNOSIS — H1810 Bullous keratopathy, unspecified eye: Secondary | ICD-10-CM | POA: Diagnosis not present

## 2014-02-22 DIAGNOSIS — H04129 Dry eye syndrome of unspecified lacrimal gland: Secondary | ICD-10-CM | POA: Diagnosis not present

## 2014-02-27 DIAGNOSIS — M6281 Muscle weakness (generalized): Secondary | ICD-10-CM | POA: Diagnosis not present

## 2014-02-27 DIAGNOSIS — R262 Difficulty in walking, not elsewhere classified: Secondary | ICD-10-CM | POA: Diagnosis not present

## 2014-03-06 DIAGNOSIS — R262 Difficulty in walking, not elsewhere classified: Secondary | ICD-10-CM | POA: Diagnosis not present

## 2014-03-06 DIAGNOSIS — M6281 Muscle weakness (generalized): Secondary | ICD-10-CM | POA: Diagnosis not present

## 2014-03-08 DIAGNOSIS — R262 Difficulty in walking, not elsewhere classified: Secondary | ICD-10-CM | POA: Diagnosis not present

## 2014-03-08 DIAGNOSIS — M6281 Muscle weakness (generalized): Secondary | ICD-10-CM | POA: Diagnosis not present

## 2014-03-09 DIAGNOSIS — M6281 Muscle weakness (generalized): Secondary | ICD-10-CM | POA: Diagnosis not present

## 2014-03-09 DIAGNOSIS — R262 Difficulty in walking, not elsewhere classified: Secondary | ICD-10-CM | POA: Diagnosis not present

## 2014-03-12 DIAGNOSIS — M6281 Muscle weakness (generalized): Secondary | ICD-10-CM | POA: Diagnosis not present

## 2014-03-12 DIAGNOSIS — R262 Difficulty in walking, not elsewhere classified: Secondary | ICD-10-CM | POA: Diagnosis not present

## 2014-03-28 ENCOUNTER — Encounter: Payer: Self-pay | Admitting: *Deleted

## 2014-03-28 DIAGNOSIS — R002 Palpitations: Secondary | ICD-10-CM

## 2014-03-28 DIAGNOSIS — I471 Supraventricular tachycardia: Secondary | ICD-10-CM

## 2014-03-28 NOTE — Progress Notes (Signed)
Patient ID: Brittany Warren, female   DOB: [birth year], 80 y.o.   MRN: [id number] E-Cardio [employer] 30 day cardiac event monitor applied to patient.

## 2014-04-03 DIAGNOSIS — M6281 Muscle weakness (generalized): Secondary | ICD-10-CM | POA: Diagnosis not present

## 2014-04-03 DIAGNOSIS — R262 Difficulty in walking, not elsewhere classified: Secondary | ICD-10-CM | POA: Diagnosis not present

## 2014-04-05 DIAGNOSIS — M6281 Muscle weakness (generalized): Secondary | ICD-10-CM | POA: Diagnosis not present

## 2014-04-05 DIAGNOSIS — R262 Difficulty in walking, not elsewhere classified: Secondary | ICD-10-CM | POA: Diagnosis not present

## 2014-04-06 DIAGNOSIS — M6281 Muscle weakness (generalized): Secondary | ICD-10-CM | POA: Diagnosis not present

## 2014-04-06 DIAGNOSIS — R262 Difficulty in walking, not elsewhere classified: Secondary | ICD-10-CM | POA: Diagnosis not present

## 2014-04-10 DIAGNOSIS — R262 Difficulty in walking, not elsewhere classified: Secondary | ICD-10-CM | POA: Diagnosis not present

## 2014-04-10 DIAGNOSIS — M6281 Muscle weakness (generalized): Secondary | ICD-10-CM | POA: Diagnosis not present

## 2014-04-16 ENCOUNTER — Ambulatory Visit: Payer: Medicare Other | Admitting: Cardiology

## 2014-04-30 ENCOUNTER — Ambulatory Visit (INDEPENDENT_AMBULATORY_CARE_PROVIDER_SITE_OTHER): Payer: Medicare Other | Admitting: Cardiology

## 2014-04-30 ENCOUNTER — Encounter: Payer: Self-pay | Admitting: Cardiology

## 2014-04-30 VITALS — BP 132/64 | HR 90 | Ht 64.0 in | Wt 118.0 lb

## 2014-04-30 DIAGNOSIS — I498 Other specified cardiac arrhythmias: Secondary | ICD-10-CM

## 2014-04-30 DIAGNOSIS — R002 Palpitations: Secondary | ICD-10-CM | POA: Diagnosis not present

## 2014-04-30 DIAGNOSIS — I471 Supraventricular tachycardia: Secondary | ICD-10-CM

## 2014-04-30 DIAGNOSIS — I359 Nonrheumatic aortic valve disorder, unspecified: Secondary | ICD-10-CM | POA: Diagnosis not present

## 2014-04-30 DIAGNOSIS — I10 Essential (primary) hypertension: Secondary | ICD-10-CM

## 2014-04-30 DIAGNOSIS — I350 Nonrheumatic aortic (valve) stenosis: Secondary | ICD-10-CM

## 2014-04-30 NOTE — Progress Notes (Signed)
HPI The patient presents for follow up of palpitations for which she has been on amiodarone. However, she did not tolerate this. She was seen in March in the ER for SVT.  However, this was self limited. She did not want any further evaluation of this when she was seen in followup in our office. She has had no further events. She denies any presyncope or syncope. She's had no chest pain or shortness of breath. She does walk routinely and does very well for her age.  Allergies  Allergen Reactions  . Codeine Nausea Only    Current Outpatient Prescriptions  Medication Sig Dispense Refill  . b complex vitamins tablet Take 1 tablet by mouth daily.       . Calcium-Vitamin D 600-200 MG-UNIT per tablet Take 1 tablet by mouth daily.       Marland Kitchen dextran 70-hypromellose (TEARS RENEWED) ophthalmic solution 1 drop. One drop for four times in right eye      . docusate sodium (COLACE) 100 MG capsule Take 100 mg by mouth daily as needed for mild constipation. As needed      . hydroxypropyl methylcellulose (ISOPTO TEARS) 2.5 % ophthalmic solution Place 1 drop into the right eye 4 (four) times daily.       . magnesium hydroxide (MILK OF MAGNESIA) 400 MG/5ML suspension Take by mouth. 2 tablespoon at bedtime for constipation      . metoprolol tartrate (LOPRESSOR) 25 MG tablet Take 1 tablet (25 mg total) by mouth daily. May take 1/2 to 1 tablet as needed for heart irregularity  60 tablet  11  . Multiple Vitamins-Minerals (ICAPS PO) Take 2 tablets by mouth daily.       . sodium chloride (MURO 128) 5 % ophthalmic ointment Place 1 drop into the left eye daily.      Marland Kitchen tolterodine (DETROL) 2 MG tablet Take 1 tablet (2 mg total) by mouth 2 (two) times daily.  60 tablet  11  . [DISCONTINUED] tolterodine (DETROL) 2 MG tablet 2 mg 2 (two) times daily as needed. Take as needed for urinary frequency       No current facility-administered medications for this visit.    Past Medical History  Diagnosis Date  . Glaucoma    "both eyes; no vision in left eye as a result" (04/06/2013)  . Anxiety     mild  . PVC (premature ventricular contraction)   . Osteoporosis   . History of recurrent UTIs   . Vision loss of left eye     "can't see at all out of it" (04/06/2013)  . SVT (supraventricular tachycardia)   . Aortic stenosis     a. Echo 03/2010:  EF 70-75%, mild AS  . Pneumonia 04/06/2013    "for the first time" (04/06/2013)    Past Surgical History  Procedure Laterality Date  . Tonsillectomy    . Appendectomy    . Glaucoma surgery Left     "put in stent for glaucoma" (04/06/2013)  . Cataract extraction, bilateral Bilateral     ROS:  As stated in the HPI and negative for all other systems.  PHYSICAL EXAM BP 132/64  Pulse 90  Ht 5\' 4"  (1.626 m)  Wt 118 lb (53.524 kg)  BMI 20.24 kg/m2 GENERAL:  Well appearing, for age 80:  Pupils equal round and reactive, fundi not visualized, oral mucosa unremarkable NECK:  No jugular venous distention, waveform within normal limits, carotid upstroke brisk and symmetric, no bruits, no thyromegaly LUNGS:  Clear to auscultation bilaterally BACK:  No CVA tenderness,lordosis CHEST:  Unremarkable HEART:  PMI not displaced or sustained,S1 and S2 within normal limits, no S3, no S4, no clicks, no rubs, systolic murmur mid peaking ABD:  Flat, positive bowel sounds normal in frequency in pitch, no bruits, no rebound, no guarding, no midline pulsatile mass, no hepatomegaly, no splenomegaly EXT:  2 plus pulses throughout, no edema, no cyanosis no clubbing  EKG:  Sinus rhythm, rate 90, left axis deviation, left intraductal hypertrophy, no acute ST-T wave changes, QTC slightly prolonged.  ASSESSMENT AND PLAN  AORTIC STENOSIS:  We are going to manage this conservatively. She has no acute symptoms. No change in therapy is indicated.  SVT:   Since March she has had no other events. No change in therapy is indicated.

## 2014-04-30 NOTE — Patient Instructions (Signed)
The current medical regimen is effective;  continue present plan and medications.  Follow up in 1 year with Dr Hochrein.  You will receive a letter in the mail 2 months before you are due.  Please call us when you receive this letter to schedule your follow up appointment.  

## 2014-06-16 DIAGNOSIS — N390 Urinary tract infection, site not specified: Secondary | ICD-10-CM | POA: Diagnosis not present

## 2014-08-06 DIAGNOSIS — H4011X0 Primary open-angle glaucoma, stage unspecified: Secondary | ICD-10-CM | POA: Diagnosis not present

## 2014-08-06 DIAGNOSIS — H409 Unspecified glaucoma: Secondary | ICD-10-CM | POA: Diagnosis not present

## 2014-08-21 DIAGNOSIS — H34832 Tributary (branch) retinal vein occlusion, left eye: Secondary | ICD-10-CM | POA: Diagnosis not present

## 2014-08-21 DIAGNOSIS — H3532 Exudative age-related macular degeneration: Secondary | ICD-10-CM | POA: Diagnosis not present

## 2014-08-21 DIAGNOSIS — H3531 Nonexudative age-related macular degeneration: Secondary | ICD-10-CM | POA: Diagnosis not present

## 2014-08-21 DIAGNOSIS — Z961 Presence of intraocular lens: Secondary | ICD-10-CM | POA: Diagnosis not present

## 2014-08-21 DIAGNOSIS — H35369 Drusen (degenerative) of macula, unspecified eye: Secondary | ICD-10-CM | POA: Diagnosis not present

## 2014-08-23 ENCOUNTER — Non-Acute Institutional Stay: Payer: Medicare Other | Admitting: Internal Medicine

## 2014-08-23 VITALS — BP 138/60 | HR 64 | Temp 97.6°F | Ht 64.0 in | Wt 119.0 lb

## 2014-08-23 DIAGNOSIS — I10 Essential (primary) hypertension: Secondary | ICD-10-CM

## 2014-08-23 DIAGNOSIS — H5462 Unqualified visual loss, left eye, normal vision right eye: Secondary | ICD-10-CM | POA: Diagnosis not present

## 2014-08-23 DIAGNOSIS — I350 Nonrheumatic aortic (valve) stenosis: Secondary | ICD-10-CM

## 2014-08-23 NOTE — Progress Notes (Signed)
Patient ID: Brittany Warren, female   DOB: [birth year], 80 y.o.   MRN: [id number]    HISTORY AND PHYSICAL  Location:  [facility] ([facility]   Extended Emergency Contact Information Primary Emergency Contact: Daniel,Gloria Address: [place], [place] Phone: [phone] Mobile Phone: [phone] Relation: Daughter Secondary Emergency Contact: [place] Phone: [phone] Mobile Phone: [phone] Relation: Friend   Chief Complaint  Patient presents with  . Annual Exam    Comprehensive exam: blood pressure, anxiety    HPI:   Had a few days of profuse diarrhea recently. It stopped 08/21/14. She never had nausea or blood in the stool. Has left her with a chafed sensation in the area around the anus.  Aortic stenosis: stable. Murmur unchanged. No angina. No syncope.  Essential hypertension: controlled  Vision loss of left eye: chronic blindness; unchanged    Past Medical History  Diagnosis Date  . Glaucoma     "both eyes; no vision in left eye as a result" (04/06/2013)  . Anxiety     mild  . PVC (premature ventricular contraction)   . Osteoporosis   . History of recurrent UTIs   . Vision loss of left eye     "can't see at all out of it" (04/06/2013)  . SVT (supraventricular tachycardia)   . Aortic stenosis     a. Echo 03/2010:  EF 70-75%, mild AS  . Pneumonia 04/06/2013    "for the first time" (04/06/2013)  . Hypertension   . Closed pelvic fracture 09/29/12    left nondisplaced inferior pelvic ramus  . Osteoarthritis 08/06/2011  . Anxiety 01/11/2014  . Chronic neck pain 08/06/2011  . Osteoporosis 04/08/2011  . Thumb pain 08/06/2011    Past Surgical History  Procedure Laterality Date  . Tonsillectomy    . Appendectomy    . Glaucoma surgery Left     "put in stent for glaucoma" (04/06/2013)  . Cataract extraction, bilateral Bilateral      Patient Care Team: Estill Dooms, MD as PCP - General (Internal Medicine) [place] Minus Breeding, MD as Consulting Physician (Cardiology) R Crecencio Mc, MD as Consulting Physician (Ophthalmology)  History   Social History  . Marital Status: Widowed    Spouse Name: N/A    Number of Children: N/A  . Years of Education: N/A   Occupational History  . retired Pharmacist, hospital    Social History Main Topics  . Smoking status: Former Smoker -- 0.50 packs/day for 30 years    Types: Cigarettes    Quit date: 01/14/1984  . Smokeless tobacco: Never Used  . Alcohol Use: 1.2 oz/week    2 Glasses of wine per week     Comment: 04/06/2013  "glass of wine 1-2X/week"  . Drug Use: No  . Sexual Activity: No   Other Topics Concern  . Not on file   Social History Narrative   [facility]   Widowed   Previously smoked 1/2 PPD, smoked 30-40 years stopped 1985   Walks with walker   Exercise with machines, and walk   Alcohol occasionally                  reports that she quit smoking about 30 years ago. Her smoking use included Cigarettes. She has a 15 pack-year smoking history. She has never  used smokeless tobacco. She reports that she drinks about 1.2 ounces of alcohol per week. She reports that she does not use illicit drugs.  Immunization History  Administered Date(s) Administered  . Influenza Split 09/22/2011, 09/13/2012  . Influenza-Unspecified 08/09/2013  . Tdap 09/13/2012    Allergies  Allergen Reactions  . Codeine Nausea Only    Medications: Patient's Medications  New Prescriptions   No medications on file  Previous Medications   B COMPLEX VITAMINS TABLET    Take 1 tablet by mouth daily.    CALCIUM-VITAMIN D 600-200 MG-UNIT PER TABLET    Take 1 tablet by mouth daily.    DEXTRAN 70-HYPROMELLOSE (TEARS RENEWED) OPHTHALMIC SOLUTION    1 drop. One drop for four times in right eye   DOCUSATE SODIUM (COLACE) 100 MG CAPSULE    Take 100 mg  by mouth daily as needed for mild constipation. As needed   HYDROXYPROPYL METHYLCELLULOSE (ISOPTO TEARS) 2.5 % OPHTHALMIC SOLUTION    Place 1 drop into the right eye 4 (four) times daily.    MAGNESIUM HYDROXIDE (MILK OF MAGNESIA) 400 MG/5ML SUSPENSION    Take by mouth. 2 tablespoon at bedtime for constipation   METOPROLOL TARTRATE (LOPRESSOR) 25 MG TABLET    Take 1 tablet (25 mg total) by mouth daily. May take 1/2 to 1 tablet as needed for heart irregularity   MULTIPLE VITAMINS-MINERALS (ICAPS PO)    Take 2 tablets by mouth daily.    SODIUM CHLORIDE (MURO 128) 5 % OPHTHALMIC OINTMENT    Place 1 drop into the left eye daily.   TOLTERODINE (DETROL) 2 MG TABLET    Take 1 tablet (2 mg total) by mouth 2 (two) times daily.  Modified Medications   No medications on file  Discontinued Medications   No medications on file     Review of Systems  Constitutional: Negative.   HENT: Negative.   Eyes:       Poor vision and glaucoma.  Respiratory: Negative.   Cardiovascular: Negative for chest pain, palpitations and leg swelling.  Gastrointestinal: Negative.   Endocrine: Negative.   Genitourinary: Negative.   Musculoskeletal: Negative.   Skin: Negative.   Neurological: Negative.        Forgetful.  Hematological: Negative.   Psychiatric/Behavioral: Negative.     Filed Vitals:   08/23/14 1621  BP: 138/60  Pulse: 64  Temp: 97.6 F (36.4 C)  TempSrc: Oral  Height: 5' 4"  (1.626 m)  Weight: 119 lb (53.978 kg)  SpO2: 90%   Body mass index is 20.42 kg/(m^2).  Physical Exam  Constitutional: She is oriented to person, place, and time. She appears well-developed and well-nourished. No distress.  HENT:  Head: Normocephalic and atraumatic.  Right Ear: External ear normal.  Left Ear: External ear normal.  Eyes: Conjunctivae and EOM are normal. Pupils are equal, round, and reactive to light.  Corrective lenses. Poor vision. Can only see light from left eye.   Neck: Neck supple. No JVD present.  No tracheal deviation present. No thyromegaly present.  Cardiovascular: Normal rate, regular rhythm and intact distal pulses.  Exam reveals no gallop and no friction rub.   Murmur (2/6 aortic ejection murmur) heard. Pulmonary/Chest: Breath sounds normal. No respiratory distress. She has no wheezes. She exhibits no tenderness.  Abdominal: Soft. Bowel sounds are normal. She exhibits no distension. There is no tenderness.  Genitourinary:  Perirectal erythema and chafing from diarrhea recently.  Musculoskeletal: Normal range of motion. She exhibits no edema and no tenderness.  Unstable gait. Using walker.  Lymphadenopathy:    She has no cervical adenopathy.  Neurological: She is alert and oriented to person, place, and time. She has normal reflexes. No cranial nerve deficit. Coordination normal.  mild memory deficit  Skin: No rash noted. No erythema. No pallor.  Psychiatric: She has a normal mood and affect. Her behavior is normal. Thought content normal.     Labs reviewed: No visits with results within 3 Month(s) from this visit. Latest known visit with results is:  Admission on 01/14/2014, Discharged on 01/14/2014  Component Date Value Ref Range Status  . WBC 01/14/2014 4.8  4.0 - 10.5 K/uL Final  . RBC 01/14/2014 3.81* 3.87 - 5.11 MIL/uL Final  . Hemoglobin 01/14/2014 11.6* 12.0 - 15.0 g/dL Final  . HCT 01/14/2014 35.6* 36.0 - 46.0 % Final  . MCV 01/14/2014 93.4  78.0 - 100.0 fL Final  . MCH 01/14/2014 30.4  26.0 - 34.0 pg Final  . MCHC 01/14/2014 32.6  30.0 - 36.0 g/dL Final  . RDW 01/14/2014 14.2  11.5 - 15.5 % Final  . Platelets 01/14/2014 192  150 - 400 K/uL Final  . Neutrophils Relative % 01/14/2014 77  43 - 77 % Final  . Neutro Abs 01/14/2014 3.7  1.7 - 7.7 K/uL Final  . Lymphocytes Relative 01/14/2014 13  12 - 46 % Final  . Lymphs Abs 01/14/2014 0.6* 0.7 - 4.0 K/uL Final  . Monocytes Relative 01/14/2014 8  3 - 12 % Final  . Monocytes Absolute 01/14/2014 0.4  0.1 - 1.0 K/uL  Final  . Eosinophils Relative 01/14/2014 2  0 - 5 % Final  . Eosinophils Absolute 01/14/2014 0.1  0.0 - 0.7 K/uL Final  . Basophils Relative 01/14/2014 0  0 - 1 % Final  . Basophils Absolute 01/14/2014 0.0  0.0 - 0.1 K/uL Final  . Sodium 01/14/2014 138  137 - 147 mEq/L Final  . Potassium 01/14/2014 4.4  3.7 - 5.3 mEq/L Final  . Chloride 01/14/2014 102  96 - 112 mEq/L Final  . CO2 01/14/2014 25  19 - 32 mEq/L Final  . Glucose, Bld 01/14/2014 119* 70 - 99 mg/dL Final  . BUN 01/14/2014 29* 6 - 23 mg/dL Final  . Creatinine, Ser 01/14/2014 1.12* 0.50 - 1.10 mg/dL Final  . Calcium 01/14/2014 8.7  8.4 - 10.5 mg/dL Final  . Total Protein 01/14/2014 6.2  6.0 - 8.3 g/dL Final  . Albumin 01/14/2014 3.3* 3.5 - 5.2 g/dL Final  . AST 01/14/2014 17  0 - 37 U/L Final  . ALT 01/14/2014 12  0 - 35 U/L Final  . Alkaline Phosphatase 01/14/2014 90  39 - 117 U/L Final  . Total Bilirubin 01/14/2014 <0.2* 0.3 - 1.2 mg/dL Final  . GFR calc non Af Amer 01/14/2014 41* >90 mL/min Final  . GFR calc Af Amer 01/14/2014 47* >90 mL/min Final   Comment: (NOTE)                          The eGFR has been calculated using the CKD EPI equation.                          This calculation has not been validated in all clinical situations.                          eGFR's persistently <90 mL/min signify possible  Chronic Kidney                          Disease.  . Troponin i, poc 01/14/2014 0.02  0.00 - 0.08 ng/mL Final  . Comment 3 01/14/2014          Final   Comment: Due to the release kinetics of cTnI,                          a negative result within the first hours                          of the onset of symptoms does not rule out                          myocardial infarction with certainty.                          If myocardial infarction is still suspected,                          repeat the test at appropriate intervals.    Assessment/Plan  1. Aortic stenosis stable  2. Essential  hypertension controlled  3. Vision loss of left eye unchanged

## 2014-08-27 DIAGNOSIS — Z23 Encounter for immunization: Secondary | ICD-10-CM | POA: Diagnosis not present

## 2014-08-28 DIAGNOSIS — I080 Rheumatic disorders of both mitral and aortic valves: Secondary | ICD-10-CM | POA: Diagnosis not present

## 2014-08-28 DIAGNOSIS — Z79899 Other long term (current) drug therapy: Secondary | ICD-10-CM | POA: Diagnosis not present

## 2014-08-28 DIAGNOSIS — E441 Mild protein-calorie malnutrition: Secondary | ICD-10-CM | POA: Diagnosis not present

## 2014-08-28 LAB — HEPATIC FUNCTION PANEL
ALK PHOS: 63 U/L (ref 25–125)
ALT: 15 U/L (ref 7–35)
AST: 20 U/L (ref 13–35)
BILIRUBIN, TOTAL: 0.5 mg/dL

## 2014-08-28 LAB — LIPID PANEL
Cholesterol: 207 mg/dL — AB (ref 0–200)
HDL: 80 mg/dL — AB (ref 35–70)
LDL Cholesterol: 113 mg/dL
Triglycerides: 70 mg/dL (ref 40–160)

## 2014-08-28 LAB — BASIC METABOLIC PANEL
BUN: 24 mg/dL — AB (ref 4–21)
CREATININE: 1.2 mg/dL — AB (ref 0.5–1.1)
Glucose: 82 mg/dL
POTASSIUM: 4.7 mmol/L (ref 3.4–5.3)
Sodium: 140 mmol/L (ref 137–147)

## 2014-08-29 ENCOUNTER — Encounter: Payer: Self-pay | Admitting: *Deleted

## 2014-09-01 DIAGNOSIS — H109 Unspecified conjunctivitis: Secondary | ICD-10-CM | POA: Diagnosis not present

## 2014-09-06 ENCOUNTER — Encounter: Payer: Self-pay | Admitting: Internal Medicine

## 2014-09-07 ENCOUNTER — Encounter: Payer: Self-pay | Admitting: Internal Medicine

## 2014-10-01 DIAGNOSIS — R079 Chest pain, unspecified: Secondary | ICD-10-CM | POA: Diagnosis not present

## 2014-10-09 DIAGNOSIS — R000 Tachycardia, unspecified: Secondary | ICD-10-CM | POA: Diagnosis not present

## 2014-10-09 DIAGNOSIS — I499 Cardiac arrhythmia, unspecified: Secondary | ICD-10-CM | POA: Diagnosis not present

## 2014-10-09 DIAGNOSIS — H04129 Dry eye syndrome of unspecified lacrimal gland: Secondary | ICD-10-CM | POA: Diagnosis not present

## 2014-10-09 DIAGNOSIS — H04209 Unspecified epiphora, unspecified lacrimal gland: Secondary | ICD-10-CM | POA: Diagnosis not present

## 2014-11-20 ENCOUNTER — Other Ambulatory Visit: Payer: Self-pay | Admitting: Cardiology

## 2014-11-20 ENCOUNTER — Other Ambulatory Visit: Payer: Self-pay | Admitting: Internal Medicine

## 2014-12-11 DIAGNOSIS — H04123 Dry eye syndrome of bilateral lacrimal glands: Secondary | ICD-10-CM | POA: Diagnosis not present

## 2014-12-11 DIAGNOSIS — H04203 Unspecified epiphora, bilateral lacrimal glands: Secondary | ICD-10-CM | POA: Diagnosis not present

## 2015-01-01 DIAGNOSIS — H18462 Peripheral corneal degeneration, left eye: Secondary | ICD-10-CM | POA: Diagnosis not present

## 2015-01-01 DIAGNOSIS — H1840 Unspecified corneal degeneration: Secondary | ICD-10-CM | POA: Diagnosis not present

## 2015-01-03 DIAGNOSIS — H1820 Unspecified corneal edema: Secondary | ICD-10-CM | POA: Diagnosis not present

## 2015-01-08 DIAGNOSIS — H1850 Unspecified hereditary corneal dystrophies: Secondary | ICD-10-CM | POA: Diagnosis not present

## 2015-01-08 DIAGNOSIS — H16142 Punctate keratitis, left eye: Secondary | ICD-10-CM | POA: Diagnosis not present

## 2015-01-09 DIAGNOSIS — H6123 Impacted cerumen, bilateral: Secondary | ICD-10-CM | POA: Diagnosis not present

## 2015-01-09 DIAGNOSIS — H903 Sensorineural hearing loss, bilateral: Secondary | ICD-10-CM | POA: Diagnosis not present

## 2015-01-10 ENCOUNTER — Encounter: Payer: Self-pay | Admitting: Internal Medicine

## 2015-01-10 ENCOUNTER — Non-Acute Institutional Stay: Payer: Medicare Other | Admitting: Internal Medicine

## 2015-01-10 VITALS — BP 140/64 | HR 64 | Wt 116.0 lb

## 2015-01-10 DIAGNOSIS — G319 Degenerative disease of nervous system, unspecified: Secondary | ICD-10-CM | POA: Diagnosis not present

## 2015-01-10 DIAGNOSIS — G309 Alzheimer's disease, unspecified: Secondary | ICD-10-CM | POA: Diagnosis not present

## 2015-01-10 DIAGNOSIS — F0280 Dementia in other diseases classified elsewhere without behavioral disturbance: Secondary | ICD-10-CM

## 2015-01-10 DIAGNOSIS — I679 Cerebrovascular disease, unspecified: Secondary | ICD-10-CM

## 2015-01-10 HISTORY — DX: Dementia in other diseases classified elsewhere, unspecified severity, without behavioral disturbance, psychotic disturbance, mood disturbance, and anxiety: F02.80

## 2015-01-10 HISTORY — DX: Degenerative disease of nervous system, unspecified: G31.9

## 2015-01-10 MED ORDER — DONEPEZIL HCL 5 MG PO TABS: ORAL_TABLET | ORAL | Status: DC
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
## 2015-01-10 NOTE — Progress Notes (Signed)
Passed clock drawing 

## 2015-01-14 ENCOUNTER — Telehealth: Payer: Self-pay | Admitting: *Deleted

## 2015-01-14 NOTE — Telephone Encounter (Signed)
Patient caregiver called and stated that patient needs a refill on Phenergan 12.5mg . This is not in patient's medication list. Please Advise.

## 2015-01-15 MED ORDER — PROMETHAZINE HCL 25 MG PO TABS: ORAL_TABLET | ORAL | Status: DC

## 2015-01-15 NOTE — Telephone Encounter (Signed)
Patient caregiver requested. Faxed to [facility] and caregiver Notified.

## 2015-01-15 NOTE — Telephone Encounter (Signed)
Located dispense Phenergan 25 mg tablets #12. Directions:  take one half tablet every 6 hours if needed for nausea.

## 2015-01-15 NOTE — Telephone Encounter (Signed)
Caregiver is going out of town and wanted to take care of this before leaving and called back today. Please Advise.

## 2015-02-11 DIAGNOSIS — Z79899 Other long term (current) drug therapy: Secondary | ICD-10-CM | POA: Diagnosis not present

## 2015-02-11 DIAGNOSIS — H4011X3 Primary open-angle glaucoma, severe stage: Secondary | ICD-10-CM | POA: Diagnosis not present

## 2015-02-21 ENCOUNTER — Encounter: Payer: Medicare Other | Admitting: Internal Medicine

## 2015-02-21 DIAGNOSIS — H1812 Bullous keratopathy, left eye: Secondary | ICD-10-CM | POA: Diagnosis not present

## 2015-03-20 DIAGNOSIS — H1812 Bullous keratopathy, left eye: Secondary | ICD-10-CM | POA: Diagnosis not present

## 2015-03-20 DIAGNOSIS — H16142 Punctate keratitis, left eye: Secondary | ICD-10-CM | POA: Diagnosis not present

## 2015-03-20 DIAGNOSIS — H18422 Band keratopathy, left eye: Secondary | ICD-10-CM | POA: Diagnosis not present

## 2015-03-21 ENCOUNTER — Encounter: Payer: Self-pay | Admitting: Internal Medicine

## 2015-03-21 ENCOUNTER — Non-Acute Institutional Stay: Payer: Medicare Other | Admitting: Internal Medicine

## 2015-03-21 VITALS — BP 138/68 | HR 72 | Temp 97.9°F | Wt 109.0 lb

## 2015-03-21 DIAGNOSIS — R634 Abnormal weight loss: Secondary | ICD-10-CM | POA: Diagnosis not present

## 2015-03-21 DIAGNOSIS — I10 Essential (primary) hypertension: Secondary | ICD-10-CM

## 2015-03-21 DIAGNOSIS — G319 Degenerative disease of nervous system, unspecified: Secondary | ICD-10-CM | POA: Diagnosis not present

## 2015-03-21 NOTE — Progress Notes (Signed)
Patient ID: Brittany Warren, female   DOB: 03/31/1917, 81 y.o.   MRN: [id number]    [facility]     [facility] ([facility]     Allergies  Allergen Reactions  . Codeine Nausea Only    Chief Complaint  Patient presents with  . Medical Management of Chronic Issues    HPI:  Cerebral atrophy: Began Aricept 5 mg 01/10/15. Tolerating it well. Continues to read often, is able to remember plot throughout reading book, no need to go back and reread due to memory issues. Denies nausea, loss of appetite, diarrhea, or tremors.   Loss of weight: Has noticed clothing doesn't fit anymore. Down 7# since visit on 01/10/15. Has not been trying to loose weight nor have her eating habits changed.   Essential hypertension: Stable. No headaches, chest pain or peripheral edema.     Medications: Patient's Medications  New Prescriptions   No medications on file  Previous Medications   B COMPLEX VITAMINS TABLET    Take 1 tablet by mouth daily.    CALCIUM-VITAMIN D 600-200 MG-UNIT PER TABLET    Take 1 tablet by mouth daily.    DEXTRAN 70-HYPROMELLOSE (TEARS RENEWED) OPHTHALMIC SOLUTION    1 drop. One drop for four times in right eye   DOCUSATE SODIUM (COLACE) 100 MG CAPSULE    Take 100 mg by mouth daily as needed for mild constipation. As needed   DONEPEZIL (ARICEPT) 5 MG TABLET    One daily to help preserve memory   MAGNESIUM HYDROXIDE (MILK OF MAGNESIA) 400 MG/5ML SUSPENSION    Take by mouth. 2 tablespoon at bedtime for constipation   METOPROLOL TARTRATE (LOPRESSOR) 25 MG TABLET    TAKE 1 TABLET BY MOUTH DAILY- MAY TAKE 1/2 TO 1 TABLET AS NEEDED FOR HEART IRREGULARITY   MULTIPLE VITAMINS-MINERALS (ICAPS PO)    Take 2 tablets by mouth daily.    POLYETHYL GLYCOL-PROPYL GLYCOL 0.4-0.3 % SOLN    Apply to eye. One drop in right eye as needed   PROMETHAZINE (PHENERGAN) 25 MG TABLET    Take 1/2 tablet by mouth every 6 hours as needed for nausea.   TOBRAMYCIN (TOBREX) 0.3 % OPHTHALMIC  OINTMENT    Apply to eyes as needed   TOBREX 0.3 % OPHTHALMIC OINTMENT       TOLTERODINE (DETROL) 2 MG TABLET    TAKE ONE TABLET TWICE DAILY   VITAMIN C, CALCIUM ASCORBATE, PO    Take by mouth. One tablet daily  Modified Medications   No medications on file  Discontinued Medications   HYDROXYPROPYL METHYLCELLULOSE (ISOPTO TEARS) 2.5 % OPHTHALMIC SOLUTION    Place 1 drop into the right eye 4 (four) times daily.    SODIUM CHLORIDE (MURO 128) 5 % OPHTHALMIC OINTMENT    Place 1 drop into the left eye daily.     Review of Systems  Constitutional: Positive for unexpected weight change.  HENT: Negative.   Eyes:       Poor vision and glaucoma.  Respiratory: Negative.   Cardiovascular: Negative for chest pain, palpitations and leg swelling.  Gastrointestinal: Negative.   Endocrine: Negative.   Genitourinary: Negative.   Musculoskeletal: Negative.   Skin: Negative.   Neurological: Negative.        Forgetful.  Hematological: Negative.   Psychiatric/Behavioral: Negative.     Filed Vitals:   03/21/15 1632  BP: 138/68  Pulse: 72  Temp: 97.9 F (36.6 C)  TempSrc: Oral  Weight: 109 lb (49.442  kg)   Body mass index is 18.7 kg/(m^2).  Physical Exam  Constitutional: She is oriented to person, place, and time. She appears well-developed and well-nourished. No distress.  HENT:  Head: Normocephalic and atraumatic.  Right Ear: External ear normal.  Left Ear: External ear normal.  Eyes: Conjunctivae and EOM are normal. Pupils are equal, round, and reactive to light.  Corrective lenses. Poor vision. Can only see light from left eye.   Neck: Neck supple. No JVD present. No tracheal deviation present. No thyromegaly present.  Cardiovascular: Normal rate, regular rhythm and intact distal pulses.  Exam reveals no gallop and no friction rub.   Murmur (2/6 aortic ejection murmur) heard. Pulmonary/Chest: Breath sounds normal. No respiratory distress. She has no wheezes. She exhibits no  tenderness.  Abdominal: Soft. Bowel sounds are normal. She exhibits no distension. There is no tenderness.  Genitourinary:  Perirectal erythema and chafing from diarrhea recently.  Musculoskeletal: Normal range of motion. She exhibits no edema or tenderness.  Unstable gait. Using walker.  Lymphadenopathy:    She has no cervical adenopathy.  Neurological: She is alert and oriented to person, place, and time. She has normal reflexes. No cranial nerve deficit. Coordination normal.  memory deficit 01/10/15 MMSE 25/30. Passed clock drawing.  Skin: No rash noted. No erythema. No pallor.  Psychiatric: She has a normal mood and affect. Her behavior is normal. Thought content normal.     Labs reviewed: No visits with results within 3 Month(s) from this visit. Latest known visit with results is:  Abstract on 08/29/2014  Component Date Value Ref Range Status  . Glucose 08/28/2014 82   Final  . BUN 08/28/2014 24* 4 - 21 mg/dL Final  . Creatinine 08/28/2014 1.2* 0.5 - 1.1 mg/dL Final  . Potassium 08/28/2014 4.7  3.4 - 5.3 mmol/L Final  . Sodium 08/28/2014 140  137 - 147 mmol/L Final  . Triglycerides 08/28/2014 70  40 - 160 mg/dL Final  . Cholesterol 08/28/2014 207* 0 - 200 mg/dL Final  . HDL 08/28/2014 80* 35 - 70 mg/dL Final  . LDL Cholesterol 08/28/2014 113   Final  . Alkaline Phosphatase 08/28/2014 63  25 - 125 U/L Final  . ALT 08/28/2014 15  7 - 35 U/L Final  . AST 08/28/2014 20  13 - 35 U/L Final  . Bilirubin, Total 08/28/2014 0.5   Final     Assessment/Plan 1. Cerebral atrophy Continue Aricept 5 mg.  2. Loss of weight Will hold off on increasing Aricept. Will continue to monitor.  3. Essential hypertension Well controlled

## 2015-04-09 DIAGNOSIS — H18422 Band keratopathy, left eye: Secondary | ICD-10-CM | POA: Diagnosis not present

## 2015-05-16 ENCOUNTER — Encounter: Payer: Self-pay | Admitting: Internal Medicine

## 2015-05-16 ENCOUNTER — Non-Acute Institutional Stay: Payer: Medicare Other | Admitting: Internal Medicine

## 2015-05-16 VITALS — BP 140/62 | HR 84 | Temp 97.6°F | Wt 107.0 lb

## 2015-05-16 DIAGNOSIS — G309 Alzheimer's disease, unspecified: Secondary | ICD-10-CM | POA: Diagnosis not present

## 2015-05-16 DIAGNOSIS — R634 Abnormal weight loss: Secondary | ICD-10-CM | POA: Diagnosis not present

## 2015-05-16 DIAGNOSIS — I10 Essential (primary) hypertension: Secondary | ICD-10-CM

## 2015-05-16 DIAGNOSIS — F0280 Dementia in other diseases classified elsewhere without behavioral disturbance: Secondary | ICD-10-CM

## 2015-05-16 NOTE — Progress Notes (Signed)
Patient ID: Brittany Warren, female   DOB: Mar 08, 1917, 81 y.o.   MRN: [id number]    [facility]     [facility] of Service[facility] (12[facility]     Allergies  Allergen Reactions  . Codeine Nausea Only    Chief Complaint  Patient presents with  . Medical Management of Chronic Issues    blood pressure, weight loss, cerebral atrophy    HPI:  Loss of weight: down anther 2#. Poor appetite. Uncertain if the Aricept has changed appetite.  Essential hypertension: controlled  Alzheimer's disease: tolerating Aricept except for concerns about weight loss    Medications: Patient's Medications  New Prescriptions   No medications on file  Previous Medications   B COMPLEX VITAMINS TABLET    Take 1 tablet by mouth daily.    CALCIUM-VITAMIN D 600-200 MG-UNIT PER TABLET    Take 1 tablet by mouth daily.    DEXTRAN 70-HYPROMELLOSE (TEARS RENEWED) OPHTHALMIC SOLUTION    1 drop. One drop for four times in right eye   DOCUSATE SODIUM (COLACE) 100 MG CAPSULE    Take 100 mg by mouth daily as needed for mild constipation. As needed   DONEPEZIL (ARICEPT) 5 MG TABLET    One daily to help preserve memory   MAGNESIUM HYDROXIDE (MILK OF MAGNESIA) 400 MG/5ML SUSPENSION    Take by mouth. 2 tablespoon at bedtime for constipation   METOPROLOL TARTRATE (LOPRESSOR) 25 MG TABLET    TAKE 1 TABLET BY MOUTH DAILY- MAY TAKE 1/2 TO 1 TABLET AS NEEDED FOR HEART IRREGULARITY   MULTIPLE VITAMINS-MINERALS (ICAPS PO)    Take 2 tablets by mouth daily.    POLYETHYL GLYCOL-PROPYL GLYCOL 0.4-0.3 % SOLN    Apply to eye. One drop in right eye as needed   PROMETHAZINE (PHENERGAN) 25 MG TABLET    Take 1/2 tablet by mouth every 6 hours as needed for nausea.   TOBRAMYCIN (TOBREX) 0.3 % OPHTHALMIC OINTMENT    Apply to eyes as needed   TOBREX 0.3 % OPHTHALMIC OINTMENT       TOLTERODINE (DETROL) 2 MG TABLET    TAKE ONE TABLET TWICE DAILY   VITAMIN C, CALCIUM ASCORBATE, PO    Take by mouth. One tablet daily  Modified Medications    No medications on file  Discontinued Medications   No medications on file     Review of Systems  Constitutional: Positive for unexpected weight change.  HENT: Negative.   Eyes:       Poor vision and glaucoma.  Respiratory: Negative.   Cardiovascular: Negative for chest pain, palpitations and leg swelling.  Gastrointestinal: Negative.   Endocrine: Negative.   Genitourinary: Negative.   Musculoskeletal: Negative.   Skin: Negative.   Neurological: Negative.        Forgetful.  Hematological: Negative.   Psychiatric/Behavioral: Negative.     Filed Vitals:   05/16/15 1634  BP: 140/62  Pulse: 84  Temp: 97.6 F (36.4 C)  TempSrc: Oral  Weight: 107 lb (48.535 kg)   Body mass index is 18.36 kg/(m^2).  Physical Exam  Constitutional: She is oriented to person, place, and time. She appears well-developed and well-nourished. No distress.  HENT:  Head: Normocephalic and atraumatic.  Right Ear: External ear normal.  Left Ear: External ear normal.  Eyes: Conjunctivae and EOM are normal. Pupils are equal, round, and reactive to light.  Corrective lenses. Poor vision. Can only see light from left eye.  Neck: Neck supple. No JVD present. No tracheal deviation  present. No thyromegaly present.  Cardiovascular: Normal rate, regular rhythm and intact distal pulses.  Exam reveals no gallop and no friction rub.   Murmur (2/6 aortic ejection murmur) heard. Pulmonary/Chest: Breath sounds normal. No respiratory distress. She has no wheezes. She exhibits no tenderness.  Abdominal: Soft. Bowel sounds are normal. She exhibits no distension. There is no tenderness.  Genitourinary:  Perirectal erythema and chafing from diarrhea recently.  Musculoskeletal: Normal range of motion. She exhibits no edema or tenderness.  Unstable gait. Using walker.  Neurological: She is alert and oriented to person, place, and time. She has normal reflexes. No cranial nerve deficit. Coordination normal.  memory  deficit 01/10/15 MMSE 25/30. Passed clock drawing.  Skin: No rash noted. No erythema. No pallor.  Psychiatric: She has a normal mood and affect. Her behavior is normal. Thought content normal.     Labs reviewed: No visits with results within 3 Month(s) from this visit. Latest known visit with results is:  Abstract on 08/29/2014  Component Date Value Ref Range Status  . Glucose 08/28/2014 82   Final  . BUN 08/28/2014 24* 4 - 21 mg/dL Final  . Creatinine 08/28/2014 1.2* 0.5 - 1.1 mg/dL Final  . Potassium 08/28/2014 4.7  3.4 - 5.3 mmol/L Final  . Sodium 08/28/2014 140  137 - 147 mmol/L Final  . Triglycerides 08/28/2014 70  40 - 160 mg/dL Final  . Cholesterol 08/28/2014 207* 0 - 200 mg/dL Final  . HDL 08/28/2014 80* 35 - 70 mg/dL Final  . LDL Cholesterol 08/28/2014 113   Final  . Alkaline Phosphatase 08/28/2014 63  25 - 125 U/L Final  . ALT 08/28/2014 15  7 - 35 U/L Final  . AST 08/28/2014 20  13 - 35 U/L Final  . Bilirubin, Total 08/28/2014 0.5   Final     Assessment/Plan  1. Loss of weight Continue to monitor  2. Essential hypertension controlled  3. Alzheimer's disease Stable. Continue Aricept.

## 2015-05-31 DIAGNOSIS — H04123 Dry eye syndrome of bilateral lacrimal glands: Secondary | ICD-10-CM | POA: Diagnosis not present

## 2015-06-18 ENCOUNTER — Encounter (HOSPITAL_COMMUNITY): Payer: Self-pay

## 2015-06-18 ENCOUNTER — Telehealth: Payer: Self-pay | Admitting: Internal Medicine

## 2015-06-18 ENCOUNTER — Emergency Department (HOSPITAL_COMMUNITY)
Admission: EM | Admit: 2015-06-18 | Discharge: 2015-06-18 | Disposition: A | Discharge status: Home / Self Care | Payer: Medicare Other | Attending: Emergency Medicine | Admitting: Emergency Medicine

## 2015-06-18 ENCOUNTER — Telehealth: Payer: Self-pay

## 2015-06-18 DIAGNOSIS — Z8701 Personal history of pneumonia (recurrent): Secondary | ICD-10-CM | POA: Insufficient documentation

## 2015-06-18 DIAGNOSIS — G8929 Other chronic pain: Secondary | ICD-10-CM | POA: Insufficient documentation

## 2015-06-18 DIAGNOSIS — Z87440 Personal history of urinary (tract) infections: Secondary | ICD-10-CM | POA: Diagnosis not present

## 2015-06-18 DIAGNOSIS — Z8659 Personal history of other mental and behavioral disorders: Secondary | ICD-10-CM | POA: Diagnosis not present

## 2015-06-18 DIAGNOSIS — I10 Essential (primary) hypertension: Secondary | ICD-10-CM | POA: Insufficient documentation

## 2015-06-18 DIAGNOSIS — H509 Unspecified strabismus: Secondary | ICD-10-CM | POA: Insufficient documentation

## 2015-06-18 DIAGNOSIS — M1990 Unspecified osteoarthritis, unspecified site: Secondary | ICD-10-CM | POA: Diagnosis not present

## 2015-06-18 DIAGNOSIS — R197 Diarrhea, unspecified: Secondary | ICD-10-CM | POA: Diagnosis not present

## 2015-06-18 DIAGNOSIS — Z87891 Personal history of nicotine dependence: Secondary | ICD-10-CM | POA: Insufficient documentation

## 2015-06-18 DIAGNOSIS — Z79899 Other long term (current) drug therapy: Secondary | ICD-10-CM | POA: Diagnosis not present

## 2015-06-18 DIAGNOSIS — Z8781 Personal history of (healed) traumatic fracture: Secondary | ICD-10-CM | POA: Insufficient documentation

## 2015-06-18 DIAGNOSIS — G309 Alzheimer's disease, unspecified: Secondary | ICD-10-CM | POA: Diagnosis not present

## 2015-06-18 DIAGNOSIS — Z8673 Personal history of transient ischemic attack (TIA), and cerebral infarction without residual deficits: Secondary | ICD-10-CM | POA: Diagnosis not present

## 2015-06-18 NOTE — ED Provider Notes (Signed)
CSN: [id number]     Arrival date & time 06/18/15  1755 History   First MD Initiated Contact with Patient 06/18/15 1908     Chief Complaint  Patient presents with  . Diarrhea  . Fever     (Consider location/radiation/quality/duration/timing/severity/associated sxs/prior Treatment) Patient is a 81 y.o. female presenting with diarrhea.  Diarrhea Quality:  Watery and semi-solid Severity:  Mild Onset quality:  Gradual Number of episodes:  Many Duration:  3 days Progression:  Resolved Relieved by:  None tried Worsened by:  Nothing tried Ineffective treatments:  None tried Associated symptoms: no abdominal pain, no chills, no fever and no vomiting   Risk factors: no recent antibiotic use, no suspicious food intake and no travel to endemic areas     Past Medical History  Diagnosis Date  . Glaucoma     "both eyes; no vision in left eye as a result" (04/06/2013)  . Anxiety     mild  . PVC (premature ventricular contraction)   . History of recurrent UTIs   . Vision loss of left eye     "can't see at all out of it" (04/06/2013)  . SVT (supraventricular tachycardia)   . Aortic stenosis     a. Echo 03/2010:  EF 70-75%, mild AS  . Pneumonia 04/06/2013    "for the first time" (04/06/2013)  . Hypertension   . Closed pelvic fracture 09/29/12    left nondisplaced inferior pelvic ramus  . Osteoarthritis 08/06/2011  . Anxiety 01/11/2014  . Chronic neck pain 08/06/2011  . Senile osteoporosis 04/08/2011  . Thumb pain 08/06/2011  . Cerebrovascular disease 11/01/2005    Small vessel disease  . Cerebral atrophy 01/10/2015  . Alzheimer's disease 01/10/2015   Past Surgical History  Procedure Laterality Date  . Tonsillectomy    . Appendectomy    . Glaucoma surgery Left     "put in stent for glaucoma" (04/06/2013)  . Cataract extraction, bilateral Bilateral    History reviewed. No pertinent family history. History  Substance Use Topics  . Smoking status: Former Smoker -- 0.50 packs/day for 30 years     Types: Cigarettes    Quit date: 01/14/1984  . Smokeless tobacco: Never Used  . Alcohol Use: 1.2 oz/week    2 Glasses of wine per week     Comment: 04/06/2013  "glass of wine 1-2X/week"   OB History    No data available     Review of Systems  Constitutional: Negative for fever and chills.  Gastrointestinal: Positive for diarrhea. Negative for vomiting and abdominal pain.  Genitourinary: Negative for hematuria and vaginal pain.  Musculoskeletal: Negative for back pain.  Skin: Negative for pallor and rash.      Allergies  Codeine  Home Medications   Prior to Admission medications   Medication Sig Start Date End Date Taking? Authorizing Provider  b complex vitamins tablet Take 1 tablet by mouth daily.     Historical Provider, MD  Calcium-Vitamin D 600-200 MG-UNIT per tablet Take 1 tablet by mouth daily.     Historical Provider, MD  dextran 70-hypromellose (TEARS RENEWED) ophthalmic solution 1 drop. One drop for four times in right eye    Historical Provider, MD  docusate sodium (COLACE) 100 MG capsule Take 100 mg by mouth daily as needed for mild constipation. As needed 09/29/12   Virgel Manifold, MD  donepezil (ARICEPT) 5 MG tablet One daily to help preserve memory 01/10/15   Estill Dooms, MD  magnesium hydroxide (MILK OF MAGNESIA)  400 MG/5ML suspension Take by mouth. 2 tablespoon at bedtime for constipation    Historical Provider, MD  metoprolol tartrate (LOPRESSOR) 25 MG tablet TAKE 1 TABLET BY MOUTH DAILY- MAY TAKE 1/2 TO 1 TABLET AS NEEDED FOR HEART IRREGULARITY 11/21/14   Minus Breeding, MD  Multiple Vitamins-Minerals (ICAPS PO) Take 2 tablets by mouth daily.     Historical Provider, MD  Polyethyl Glycol-Propyl Glycol 0.4-0.3 % SOLN Apply to eye. One drop in right eye as needed    Historical Provider, MD  promethazine (PHENERGAN) 25 MG tablet Take 1/2 tablet by mouth every 6 hours as needed for nausea. 01/15/15   Estill Dooms, MD  tobramycin (TOBREX) 0.3 % ophthalmic ointment  Apply to eyes as needed 12/11/14   Historical Provider, MD  tolterodine (DETROL) 2 MG tablet TAKE ONE TABLET TWICE DAILY 11/20/14   Elby Showers, MD  VITAMIN C, CALCIUM ASCORBATE, PO Take by mouth. One tablet daily    Historical Provider, MD   BP 142/54 mmHg  Pulse 70  Temp(Src) 98.1 F (36.7 C) (Oral)  Resp 16  SpO2 96% Physical Exam  Constitutional: She is oriented to person, place, and time. She appears well-developed and well-nourished.  HENT:  Head: Normocephalic and atraumatic.  Eyes: Conjunctivae are normal. Right eye exhibits no discharge. Left eye exhibits no discharge.  Strabismus of left  Cardiovascular: Normal rate and regular rhythm.   Pulmonary/Chest: Effort normal and breath sounds normal. No respiratory distress.  Abdominal: Soft. She exhibits no distension. There is no tenderness. There is no rebound.  Musculoskeletal: Normal range of motion. She exhibits no edema or tenderness.  Neurological: She is alert and oriented to person, place, and time.  Skin: Skin is warm and dry.  Nursing note and vitals reviewed.   ED Course  Procedures (including critical care time) Labs Review Labs Reviewed - No data to display  Imaging Review No results found.   EKG Interpretation None      MDM   Final diagnoses:  Diarrhea    81 yo F here at direction of facility to ensure she is ok. Had some diarrhea ending a few days ago, tried to see PCP but couldn't get appointment so was evaluated by a nurse who found that she had a diastolic blood pressure of 48 and was sent here for further evaluation. Here patient is wanting to go home and keeps insisting on the same. Examination is without tachycardia she has multiple normal blood pressures in a row she has skin tenting but that is appropriate for age. She has not had any nausea or vomiting she is tolerating by mouth I feel like the patient has good in-home care. She is alert and oriented and is able to make her own choices and  through shared decision making we decided she had the ability and willingness to come back if things change and she will do so. I don't think the patient has his surgical abdomen based on my exam and her story therefore don't think labs are necessarily indicated. She is tolerating by mouth and still making normal urine so I don't think she has any kidney dysfunction patient will be discharged home to follow with her PCP within a week.    Merrily Pew, MD 06/19/15 1455

## 2015-06-18 NOTE — Telephone Encounter (Signed)
Patient's caregiver, Brittany Warren called to say that patient is sick with fever, diarrhea and low BP.  Patient is a resident at [facility].  States Dr. Nyoka Cowden is not able to see the patient at all this week.  Wants to know if Dr. Renold Genta can see the patient.  We have not seen the patient is well over 3 years.  Advised that we do not see patient's on Wednesday's.  Our scheduled is pretty tight on Thursday already.  Advised that with patient's age and being this sick, she shouldn't wait until Thursday to have her seen.  She wanted to know about Friday.  Again, told her that patient shouldn't be put off until Friday to be seen.  States patient has been sick for approximately 2 days (as best she can tell -- patient is not the best historian given her dementia).    Spoke with Dr. Renold Genta; given the time of day and the patient's symptoms, patient MAY be dehydrated.  Patient should be taken to the ED to be seen and evaluated.    Spoke with Peter Congo and advised that patient should be taken to ED for evaluation due to age, low BP, diarrhea, fever x 2 days -- high possibility of dehydration.  Advised Dr. Renold Genta wanted patient to be evaluated where she could have labs and fluids as needed.  Peter Congo is on her way to [facility] and will let her know that Dr. Renold Genta thinks she should go to the ED for evaluation.

## 2015-06-18 NOTE — Telephone Encounter (Signed)
Patient called said she has diarrhea, has only gone once today. Few days ago had it 2 days, doesn't know how many times she had bowel movement "I didn't write it down". I asked if she has used any Imodium, she hadn't. I offer appointment at main office (pt lives at [facility]) Dr. Nyoka Cowden and [facility] are on vacation, there won't be a clinic Thursday. Asked if she had talked with Tiffany at [facility], "she couldn't get in touch with her".  I suggested she increase her fluids, and try the Imodium, if that doesn't help call back for appointment.  I called Tiffany [facility] clinic nurse, to let know about patient. Concern about her because of her memory and patient is very hard of hearing. Asked if she could check on her. She would, she knows the patients memory is getting worse, she is not eating very well. She has talked with family, they think it is about time for patient to move to [place]. She will get back with me.

## 2015-06-18 NOTE — ED Notes (Signed)
Patient given water and crackers by Dr. Dayna Barker, patient drank all of water and ate crackers with no nausea or vomiting

## 2015-06-18 NOTE — ED Notes (Signed)
Pt c/o fever and diarrhea x  "a few days."  Denies pain.  Pt was unable to get into PCP and was directed to come to the ED.  Facility is concerned about dehydration.  Hx of dementia.

## 2015-07-08 ENCOUNTER — Ambulatory Visit: Payer: BC Managed Care – PPO | Admitting: Cardiology

## 2015-07-16 ENCOUNTER — Encounter: Payer: Self-pay | Admitting: Cardiology

## 2015-07-16 ENCOUNTER — Ambulatory Visit (INDEPENDENT_AMBULATORY_CARE_PROVIDER_SITE_OTHER): Payer: Medicare Other | Admitting: Cardiology

## 2015-07-16 VITALS — BP 112/50 | HR 54 | Ht 64.5 in | Wt 100.8 lb

## 2015-07-16 DIAGNOSIS — I471 Supraventricular tachycardia: Secondary | ICD-10-CM | POA: Diagnosis not present

## 2015-07-16 DIAGNOSIS — I350 Nonrheumatic aortic (valve) stenosis: Secondary | ICD-10-CM | POA: Diagnosis not present

## 2015-07-16 DIAGNOSIS — I10 Essential (primary) hypertension: Secondary | ICD-10-CM

## 2015-07-16 NOTE — Progress Notes (Signed)
HPI The patient presents for follow up of palpitations for which she has been on amiodarone. However, she did not tolerate this. Since I last saw her she said no new cardiovascular problems.  She has had no further sustained palpitations although occasionally she might have something the last about a minute.  She denies any presyncope or syncope. She's had no chest pain or shortness of breath. She does walk routinely and does very well for her age.  Allergies  Allergen Reactions  . Codeine Nausea Only    Current Outpatient Prescriptions  Medication Sig Dispense Refill  . b complex vitamins tablet Take 1 tablet by mouth daily.     . Calcium-Vitamin D 600-200 MG-UNIT per tablet Take 1 tablet by mouth daily.     Marland Kitchen dextran 70-hypromellose (TEARS RENEWED) ophthalmic solution 1 drop. One drop for four times in right eye    . docusate sodium (COLACE) 100 MG capsule Take 100 mg by mouth daily as needed for mild constipation. As needed    . donepezil (ARICEPT) 5 MG tablet One daily to help preserve memory 50 tablet 1  . LOTEMAX 0.5 % GEL Apply 1 drop to eye daily.    . magnesium hydroxide (MILK OF MAGNESIA) 400 MG/5ML suspension Take by mouth. 2 tablespoon at bedtime for constipation    . metoprolol tartrate (LOPRESSOR) 25 MG tablet TAKE 1 TABLET BY MOUTH DAILY- MAY TAKE 1/2 TO 1 TABLET AS NEEDED FOR HEART IRREGULARITY 60 tablet 3  . Multiple Vitamins-Minerals (ICAPS PO) Take 2 tablets by mouth daily.     Marland Kitchen ofloxacin (OCUFLOX) 0.3 % ophthalmic solution Place 1 drop into both eyes daily.  0  . Polyethyl Glycol-Propyl Glycol 0.4-0.3 % SOLN Apply to eye. One drop in right eye as needed    . PROLENSA 0.07 % SOLN Apply 1 drop to eye daily.    . promethazine (PHENERGAN) 25 MG tablet Take 1/2 tablet by mouth every 6 hours as needed for nausea. 12 tablet 0  . tobramycin (TOBREX) 0.3 % ophthalmic ointment Apply to eyes as needed    . tolterodine (DETROL) 2 MG tablet TAKE ONE TABLET TWICE DAILY 60 tablet 11   . VITAMIN C, CALCIUM ASCORBATE, PO Take by mouth. One tablet daily     No current facility-administered medications for this visit.    Past Medical History  Diagnosis Date  . Glaucoma     "both eyes; no vision in left eye as a result" (04/06/2013)  . Anxiety     mild  . PVC (premature ventricular contraction)   . History of recurrent UTIs   . Vision loss of left eye     "can't see at all out of it" (04/06/2013)  . SVT (supraventricular tachycardia)   . Aortic stenosis     a. Echo 03/2010:  EF 70-75%, mild AS  . Pneumonia 04/06/2013    "for the first time" (04/06/2013)  . Hypertension   . Closed pelvic fracture 09/29/12    left nondisplaced inferior pelvic ramus  . Osteoarthritis 08/06/2011  . Anxiety 01/11/2014  . Chronic neck pain 08/06/2011  . Senile osteoporosis 04/08/2011  . Thumb pain 08/06/2011  . Cerebrovascular disease 11/01/2005    Small vessel disease  . Cerebral atrophy 01/10/2015  . Alzheimer's disease 01/10/2015    Past Surgical History  Procedure Laterality Date  . Tonsillectomy    . Appendectomy    . Glaucoma surgery Left     "put in stent for glaucoma" (04/06/2013)  .  Cataract extraction, bilateral Bilateral     ROS:  As stated in the HPI and negative for all other systems.  PHYSICAL EXAM BP 112/50 mmHg  Pulse 54  Ht 5' 4.5" (1.638 m)  Wt 100 lb 12.8 oz (45.723 kg)  BMI 17.04 kg/m2 GENERAL:  Well appearing, for age 81:  Pupils equal round and reactive, fundi not visualized, oral mucosa unremarkable NECK:  No jugular venous distention, waveform within normal limits, carotid upstroke brisk and symmetric, no bruits, no thyromegaly LUNGS:  Clear to auscultation bilaterally BACK:  No CVA tenderness,lordosis CHEST:  Unremarkable HEART:  PMI not displaced or sustained,S1 and S2 within normal limits, no S3, no S4, no clicks, no rubs, systolic murmur mid peaking ABD:  Flat, positive bowel sounds normal in frequency in pitch, no bruits, no rebound, no guarding, no  midline pulsatile mass, no hepatomegaly, no splenomegaly EXT:  2 plus pulses throughout, no edema, no cyanosis no clubbing  EKG:  Sinus rhythm, rate 54, left axis deviation, left ventricular hypertrophy, no acute ST-T wave changes, QTC normal.  07/16/2015 .  ASSESSMENT AND PLAN  AORTIC STENOSIS:  We are going to manage this conservatively. She has no acute symptoms. No change in therapy is indicated. She will follow as needed.    SVT:  She has occasional palpitations.  However, this is a stable pattern. No change in therapy is indicated.

## 2015-07-16 NOTE — Patient Instructions (Signed)
Your physician recommends that you schedule a follow-up appointment As Needed  

## 2015-08-03 DIAGNOSIS — F419 Anxiety disorder, unspecified: Secondary | ICD-10-CM | POA: Diagnosis not present

## 2015-08-03 DIAGNOSIS — F99 Mental disorder, not otherwise specified: Secondary | ICD-10-CM | POA: Diagnosis not present

## 2015-08-03 DIAGNOSIS — F29 Unspecified psychosis not due to a substance or known physiological condition: Secondary | ICD-10-CM | POA: Diagnosis not present

## 2015-08-08 DIAGNOSIS — Z23 Encounter for immunization: Secondary | ICD-10-CM | POA: Diagnosis not present

## 2015-09-05 DIAGNOSIS — H04123 Dry eye syndrome of bilateral lacrimal glands: Secondary | ICD-10-CM | POA: Diagnosis not present

## 2015-09-12 ENCOUNTER — Encounter: Payer: Self-pay | Admitting: Nurse Practitioner

## 2015-09-12 ENCOUNTER — Non-Acute Institutional Stay: Payer: Medicare Other | Admitting: Nurse Practitioner

## 2015-09-12 VITALS — BP 142/80 | HR 72 | Temp 97.7°F | Wt 101.0 lb

## 2015-09-12 DIAGNOSIS — I471 Supraventricular tachycardia: Secondary | ICD-10-CM | POA: Diagnosis not present

## 2015-09-12 DIAGNOSIS — I10 Essential (primary) hypertension: Secondary | ICD-10-CM | POA: Diagnosis not present

## 2015-09-12 DIAGNOSIS — F411 Generalized anxiety disorder: Secondary | ICD-10-CM

## 2015-09-12 DIAGNOSIS — K5900 Constipation, unspecified: Secondary | ICD-10-CM | POA: Diagnosis not present

## 2015-09-12 DIAGNOSIS — G308 Other Alzheimer's disease: Secondary | ICD-10-CM | POA: Diagnosis not present

## 2015-09-12 DIAGNOSIS — K5909 Other constipation: Secondary | ICD-10-CM | POA: Insufficient documentation

## 2015-09-12 DIAGNOSIS — N318 Other neuromuscular dysfunction of bladder: Secondary | ICD-10-CM

## 2015-09-12 DIAGNOSIS — F0280 Dementia in other diseases classified elsewhere without behavioral disturbance: Secondary | ICD-10-CM | POA: Diagnosis not present

## 2015-09-12 NOTE — Assessment & Plan Note (Signed)
Managed with adult brief, not on med 

## 2015-09-12 NOTE — Assessment & Plan Note (Signed)
Heart rate is in control, prn Metoprolol 25mg  available

## 2015-09-12 NOTE — Progress Notes (Signed)
Patient ID: Brittany Warren, female   DOB: 02-28-1917, 81 y.o.   MRN: [id number]  Location:  clinic [place] Provider:  Marlana Latus NP  Code Status:  DNR Goals of care: Advanced Directive information Does patient have an advance directive?: Yes, Type of Advance Directive: [place];Living will  Chief Complaint  Patient presents with  . Acute Visit    anger     HPI: Patient is a 81 y.o. female seen in the clinic at [facility] today for evaluation of chronic medical conditions. Recently admitted to [facility] due to [place] lack of self sufficiency in [place]. Hx of dementia, taking Aricept presently. She said that she needs MOM 30mg  qhs  for constipation, Lorazepam for anxiety/sleep but still lots of anxiety, poor appetite, weight loss, Metoprolol 25mg  for heart rate as needed. She is very independent lady, hx of medication refusal.   Review of Systems:  Review of Systems  Constitutional: Positive for weight loss. Negative for fever, chills, malaise/fatigue and diaphoresis.  HENT: Positive for hearing loss. Negative for congestion, ear discharge, ear pain, nosebleeds and tinnitus.   Eyes: Negative for blurred vision, double vision, photophobia, pain and discharge.       Poor vision and glaucoma. Left eye has no vision  Respiratory: Negative.  Negative for cough, hemoptysis, sputum production, shortness of breath and wheezing.   Cardiovascular: Negative for chest pain, palpitations and leg swelling.  Gastrointestinal: Positive for constipation. Negative for heartburn, nausea, vomiting, abdominal pain, diarrhea and blood in stool.  Genitourinary: Positive for frequency. Negative for dysuria, urgency and hematuria.  Musculoskeletal: Negative.  Negative for myalgias, back pain, joint pain and neck pain.  Skin: Negative.  Negative for itching and rash.  Neurological: Negative.  Negative for dizziness, tingling, tremors, sensory change, speech change, focal weakness and  weakness.       Forgetful.  Endo/Heme/Allergies: Does not bruise/bleed easily.  Psychiatric/Behavioral: Positive for depression and memory loss. Negative for suicidal ideas, hallucinations and substance abuse. The patient is nervous/anxious. The patient does not have insomnia.     Past Medical History  Diagnosis Date  . Glaucoma     "both eyes; no vision in left eye as a result" (04/06/2013)  . Anxiety     mild  . PVC (premature ventricular contraction)   . History of recurrent UTIs   . Vision loss of left eye     "can't see at all out of it" (04/06/2013)  . SVT (supraventricular tachycardia) ([place])   . Aortic stenosis     a. Echo 03/2010:  EF 70-75%, mild AS  . Pneumonia 04/06/2013    "for the first time" (04/06/2013)  . Hypertension   . Closed pelvic fracture ([place]) 09/29/12    left nondisplaced inferior pelvic ramus  . Osteoarthritis 08/06/2011  . Anxiety 01/11/2014  . Chronic neck pain 08/06/2011  . Senile osteoporosis 04/08/2011  . Thumb pain 08/06/2011  . Cerebrovascular disease 11/01/2005    Small vessel disease  . Cerebral atrophy 01/10/2015  . Alzheimer's disease 01/10/2015    Patient Active Problem List   Diagnosis Date Noted  . Constipation 09/12/2015  . Loss of weight 05/16/2015  . 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 Cerebral atrophy 01/10/2015  . Alzheimer's disease 01/10/2015  . Vision loss of left eye   . Anxiety state 01/11/2014  . DNR (do not resuscitate) 04/13/2013  . 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 Aortic stenosis   . SVT (supraventricular tachycardia) ([place])   . Chronic  neck pain 08/06/2011  . Osteoarthritis 08/06/2011  . Thumb pain 08/06/2011  . HTN (hypertension) 07/07/2011  . Glaucoma 04/08/2011  . History of recurrent UTIs 04/08/2011  . Osteoporosis 04/08/2011  . PALPITATIONS 05/27/2010  . Cerebrovascular disease 11/01/2005    Allergies  Allergen Reactions  . Codeine Nausea Only    Medications: Patient's Medications  New Prescriptions   No  medications on file  Previous Medications   B COMPLEX VITAMINS TABLET    Take 1 tablet by mouth daily.    CALCIUM-VITAMIN D 600-200 MG-UNIT PER TABLET    Take 1 tablet by mouth daily.    DEXTRAN 70-HYPROMELLOSE (TEARS RENEWED) OPHTHALMIC SOLUTION    1 drop. One drop for four times in right eye   DOCUSATE SODIUM (COLACE) 100 MG CAPSULE    Take 100 mg by mouth daily as needed for mild constipation. As needed   DONEPEZIL (ARICEPT) 5 MG TABLET    One daily to help preserve memory   LORAZEPAM (ATIVAN) 0.5 MG TABLET    Take one tablet every 6 hours as needed for anxiety   LOTEMAX 0.5 % GEL    Apply 1 drop to eye daily.   MAGNESIUM HYDROXIDE (MILK OF MAGNESIA) 400 MG/5ML SUSPENSION    Take by mouth. 2 tablespoon at bedtime for constipation   METOPROLOL TARTRATE (LOPRESSOR) 25 MG TABLET    TAKE 1 TABLET BY MOUTH DAILY- MAY TAKE 1/2 TO 1 TABLET AS NEEDED FOR HEART IRREGULARITY   MULTIPLE VITAMINS-MINERALS (ICAPS PO)    Take 2 tablets by mouth daily.    OFLOXACIN (OCUFLOX) 0.3 % OPHTHALMIC SOLUTION    Place 1 drop into both eyes daily.   POLYETHYL GLYCOL-PROPYL GLYCOL 0.4-0.3 % SOLN    Apply to eye. One drop in right eye as needed   PROLENSA 0.07 % SOLN    Apply 1 drop to eye daily.   PROMETHAZINE (PHENERGAN) 25 MG TABLET    Take 1/2 tablet by mouth every 6 hours as needed for nausea.   TOBRAMYCIN (TOBREX) 0.3 % OPHTHALMIC OINTMENT    Apply to eyes as needed   TOLTERODINE (DETROL) 2 MG TABLET    TAKE ONE TABLET TWICE DAILY   VITAMIN C, CALCIUM ASCORBATE, PO    Take by mouth. One tablet daily  Modified Medications   No medications on file  Discontinued Medications   No medications on file    Physical Exam: Filed Vitals:   09/12/15 1545  BP: 142/80  Pulse: 72  Temp: 97.7 F (36.5 C)  TempSrc: Oral  Weight: 101 lb (45.813 kg)  SpO2: 99%   Body mass index is 17.08 kg/(m^2).  Physical Exam  Constitutional: She is oriented to person, place, and time. She appears well-developed and  well-nourished. No distress.  HENT:  Head: Normocephalic and atraumatic.  Right Ear: External ear normal.  Left Ear: External ear normal.  Eyes: Conjunctivae and EOM are normal. Pupils are equal, round, and reactive to light.  Corrective lenses. Poor vision. Can only see light from left eye.  Neck: Neck supple. No JVD present. No tracheal deviation present. No thyromegaly present.  Cardiovascular: Normal rate, regular rhythm and intact distal pulses.  Exam reveals no gallop and no friction rub.   Murmur (2/6 aortic ejection murmur) heard. Pulmonary/Chest: Breath sounds normal. No respiratory distress. She has no wheezes. She exhibits no tenderness.  Abdominal: Soft. Bowel sounds are normal. She exhibits no distension. There is no tenderness.  Musculoskeletal: Normal range of motion. She exhibits no edema  or tenderness.  Unstable gait. Using walker.  Neurological: She is alert and oriented to person, place, and time. She has normal reflexes. No cranial nerve deficit. Coordination normal.  memory deficit 01/10/15 MMSE 25/30. Passed clock drawing.  Skin: No rash noted. No erythema. No pallor.  Psychiatric: She has a normal mood and affect. Her behavior is normal. Thought content normal.  Lots of anxiety    Labs reviewed: Basic Metabolic Panel: No results for input(s): NA, K, CL, CO2, GLUCOSE, BUN, CREATININE, CALCIUM, MG, PHOS in the last 8760 hours.  Liver Function Tests: No results for input(s): AST, ALT, ALKPHOS, BILITOT, PROT, ALBUMIN in the last 8760 hours.  CBC: No results for input(s): WBC, NEUTROABS, HGB, HCT, MCV, PLT in the last 8760 hours.  Lab Results  Component Value Date   TSH 1.260 04/06/2013   No results found for: HGBA1C Lab Results  Component Value Date   CHOL 207* 08/28/2014   HDL 80* 08/28/2014   LDLCALC 113 08/28/2014   TRIG 70 08/28/2014    Significant Diagnostic Results since last visit: none  Patient Care Team: Estill Dooms, MD as PCP - General  (Internal Medicine) Merkel Minus Breeding, MD as Consulting Physician (Cardiology) R Crecencio Mc, MD as Consulting Physician (Ophthalmology) Leta Baptist, MD as Consulting Physician (Otolaryngology)  Assessment/Plan Problem List Items Addressed This Visit    SVT (supraventricular tachycardia) ([place])    Heart rate is in control, prn Metoprolol 25mg  available       Hypertonicity of bladder    Managed with adult brief, not on med      HTN (hypertension) (Chronic)    Controlled, has prn Metoprolol 25mg  available to her for occasional elevated BP/P      Constipation    MOM 15ml qhs.       Anxiety state    Prn Lorazepam available to her. Will try Mirtazapine 7.5mg  qhs in setting of weight loss. Update CBC, CMP, TSH, Hgb a1c       Relevant Medications   LORazepam (ATIVAN) 0.5 MG tablet   Alzheimer's disease - Primary (Chronic)    Continue to decline cognitively, taking Aricept, [place]. Update MMSE      Relevant Medications   LORazepam (ATIVAN) 0.5 MG tablet       Family/ staff Communication: [place] for care needs  Labs/tests ordered: CBC, CMP, TSH, Hgb A1c  ManXie Mast NP [facility] [address], [place] [address] On Call:  [phone] & follow prompts after 5pm & weekends Office Phone:  [phone] Office Fax:  [phone]

## 2015-09-12 NOTE — Assessment & Plan Note (Addendum)
MOM 40ml qhs.

## 2015-09-12 NOTE — Assessment & Plan Note (Signed)
Controlled, has prn Metoprolol 25mg  available to her for occasional elevated BP/P

## 2015-09-12 NOTE — Assessment & Plan Note (Addendum)
Continue to decline cognitively, taking Aricept, recently admitted to [place] for care needs due to [place] lacking of self sufficiency [place]. Update MMSE

## 2015-09-12 NOTE — Assessment & Plan Note (Addendum)
Prn Lorazepam available to her. Will try Mirtazapine 7.5mg  qhs in setting of weight loss. Update CBC, CMP, TSH, Hgb a1c

## 2015-09-17 DIAGNOSIS — E441 Mild protein-calorie malnutrition: Secondary | ICD-10-CM | POA: Diagnosis not present

## 2015-09-17 DIAGNOSIS — E162 Hypoglycemia, unspecified: Secondary | ICD-10-CM | POA: Diagnosis not present

## 2015-09-17 LAB — HEPATIC FUNCTION PANEL
ALK PHOS: 58 U/L (ref 25–125)
ALT: 15 U/L (ref 7–35)
AST: 17 U/L (ref 13–35)
BILIRUBIN, TOTAL: 0.5 mg/dL

## 2015-09-17 LAB — BASIC METABOLIC PANEL
BUN: 25 mg/dL — AB (ref 4–21)
CREATININE: 1.2 mg/dL — AB (ref 0.5–1.1)
Glucose: 82 mg/dL
Potassium: 5 mmol/L (ref 3.4–5.3)
Sodium: 141 mmol/L (ref 137–147)

## 2015-09-17 LAB — CBC AND DIFFERENTIAL
HEMATOCRIT: 35 % — AB (ref 36–46)
Hemoglobin: 11.3 g/dL — AB (ref 12.0–16.0)
PLATELETS: 180 10*3/uL (ref 150–399)
WBC: 4.3 10^3/mL

## 2015-09-18 ENCOUNTER — Other Ambulatory Visit: Payer: Self-pay | Admitting: Nurse Practitioner

## 2015-09-18 DIAGNOSIS — I129 Hypertensive chronic kidney disease with stage 1 through stage 4 chronic kidney disease, or unspecified chronic kidney disease: Secondary | ICD-10-CM | POA: Insufficient documentation

## 2015-09-18 DIAGNOSIS — N182 Chronic kidney disease, stage 2 (mild): Secondary | ICD-10-CM

## 2015-09-18 DIAGNOSIS — D638 Anemia in other chronic diseases classified elsewhere: Secondary | ICD-10-CM | POA: Insufficient documentation

## 2015-09-18 DIAGNOSIS — D631 Anemia in chronic kidney disease: Secondary | ICD-10-CM

## 2015-09-18 DIAGNOSIS — N189 Chronic kidney disease, unspecified: Principal | ICD-10-CM

## 2015-09-19 ENCOUNTER — Encounter: Payer: Self-pay | Admitting: Internal Medicine

## 2015-12-12 ENCOUNTER — Encounter: Payer: Self-pay | Admitting: Internal Medicine

## 2015-12-12 ENCOUNTER — Non-Acute Institutional Stay: Payer: Medicare Other | Admitting: Internal Medicine

## 2015-12-12 VITALS — BP 146/60 | HR 69 | Temp 97.4°F | Ht 65.0 in | Wt 103.0 lb

## 2015-12-12 DIAGNOSIS — F411 Generalized anxiety disorder: Secondary | ICD-10-CM | POA: Diagnosis not present

## 2015-12-12 DIAGNOSIS — D631 Anemia in chronic kidney disease: Secondary | ICD-10-CM

## 2015-12-12 DIAGNOSIS — I10 Essential (primary) hypertension: Secondary | ICD-10-CM

## 2015-12-12 DIAGNOSIS — F0280 Dementia in other diseases classified elsewhere without behavioral disturbance: Secondary | ICD-10-CM

## 2015-12-12 DIAGNOSIS — N182 Chronic kidney disease, stage 2 (mild): Secondary | ICD-10-CM | POA: Diagnosis not present

## 2015-12-12 DIAGNOSIS — G308 Other Alzheimer's disease: Secondary | ICD-10-CM

## 2015-12-12 DIAGNOSIS — N189 Chronic kidney disease, unspecified: Secondary | ICD-10-CM

## 2015-12-12 NOTE — Progress Notes (Signed)
Patient ID: Brittany Warren, female   DOB: 06-23-1917, 82 y.o.   MRN: [id number]    [place] Room Number: 817  Place of Service: Clinic (12)     Allergies  Allergen Reactions  . Codeine Nausea Only    Chief Complaint  Patient presents with  . Medical Management of Chronic Issues    blood pressure, Alzheimer, anxiety    HPI:  Essential hypertension - controlled  Alzheimer's disease of other onset without behavioral disturbance - unchanged. Unclear if the dominant problem is alzheimer's or vascular dementia. Probaly has a component of both.  Anxiety state - improved. Slepps like a log she says  Anemia in chronic kidney disease - Hgb 11.3 in nov 2016  Chronic kidney disease, stage 2 (mild) - stable    Medications: Patient's Medications  New Prescriptions   No medications on file  Previous Medications   B COMPLEX VITAMINS TABLET    Take 1 tablet by mouth daily.    CALCIUM-VITAMIN D 600-200 MG-UNIT PER TABLET    Take 1 tablet by mouth daily.    DEXTRAN 70-HYPROMELLOSE (TEARS RENEWED) OPHTHALMIC SOLUTION    1 drop. One drop for four times in right eye   DOCUSATE SODIUM (COLACE) 100 MG CAPSULE    Take 100 mg by mouth daily as needed for mild constipation. As needed   DONEPEZIL (ARICEPT) 5 MG TABLET    One daily to help preserve memory   LORAZEPAM (ATIVAN) 0.5 MG TABLET    Take one tablet every 6 hours as needed for anxiety   LOTEMAX 0.5 % GEL    Apply 1 drop to eye daily.   MAGNESIUM HYDROXIDE (MILK OF MAGNESIA) 400 MG/5ML SUSPENSION    Take by mouth. 2 tablespoon at bedtime for constipation   METOPROLOL TARTRATE (LOPRESSOR) 25 MG TABLET    TAKE 1 TABLET BY MOUTH DAILY- MAY TAKE 1/2 TO 1 TABLET AS NEEDED FOR HEART IRREGULARITY   MULTIPLE VITAMINS-MINERALS (ICAPS PO)    Take 2 tablets by mouth daily.    OFLOXACIN (OCUFLOX) 0.3 % OPHTHALMIC SOLUTION    Place 1 drop into both eyes daily.   POLYETHYL GLYCOL-PROPYL GLYCOL 0.4-0.3 % SOLN    Apply to  eye. One drop in right eye as needed   PROLENSA 0.07 % SOLN    Apply 1 drop to eye daily.   PROMETHAZINE (PHENERGAN) 25 MG TABLET    Take 1/2 tablet by mouth every 6 hours as needed for nausea.   TOBRAMYCIN (TOBREX) 0.3 % OPHTHALMIC OINTMENT    Apply to eyes as needed   TOLTERODINE (DETROL) 2 MG TABLET    TAKE ONE TABLET TWICE DAILY   VITAMIN C, CALCIUM ASCORBATE, PO    Take by mouth. One tablet daily  Modified Medications   No medications on file  Discontinued Medications   No medications on file     Review of Systems  Constitutional: Negative for fever, chills, diaphoresis, activity change, appetite change, fatigue and unexpected weight change.       Regained 2 # since Nov 2016  HENT: Negative for congestion, ear discharge, ear pain, hearing loss, postnasal drip, rhinorrhea, sore throat, tinnitus, trouble swallowing and voice change.   Eyes: Negative for pain, redness, itching and visual disturbance.       Poor vision and glaucoma.  Respiratory: Negative for cough, choking, shortness of breath and wheezing.   Cardiovascular: Negative for chest pain, palpitations and leg swelling.  Gastrointestinal: Negative for nausea, abdominal  pain, diarrhea, constipation and abdominal distention.  Endocrine: Negative for cold intolerance, heat intolerance, polydipsia, polyphagia and polyuria.  Genitourinary: Negative for dysuria, urgency, frequency, hematuria, flank pain, vaginal discharge, difficulty urinating and pelvic pain.  Musculoskeletal: Positive for gait problem (using 4 wheel walker with seat and brakes). Negative for myalgias, back pain, arthralgias, neck pain and neck stiffness.  Skin: Negative for color change, pallor and rash.  Neurological: Negative for dizziness, tremors, seizures, syncope, weakness, numbness and headaches.       Forgetful.  Hematological: Negative for adenopathy. Does not bruise/bleed easily.  Psychiatric/Behavioral: Negative for suicidal ideas, hallucinations,  behavioral problems, confusion, sleep disturbance, dysphoric mood and agitation. The patient is not nervous/anxious and is not hyperactive.     Filed Vitals:   12/12/15 1540  BP: 146/60  Pulse: 69  Temp: 97.4 F (36.3 C)  TempSrc: Oral  Height: _0  (1.651 m)  Weight: 103 lb (46.72 kg)  SpO2: 98%   Wt Readings from Last 3 Encounters:  12/12/15 103 lb (46.72 kg)  09/12/15 101 lb (45.813 kg)  07/16/15 100 lb 12.8 oz (45.723 kg)    Body mass index is 17.14 kg/(m^2).  Physical Exam  Constitutional: She is oriented to person, place, and time. She appears well-developed and well-nourished. No distress.  HENT:  Head: Normocephalic and atraumatic.  Right Ear: External ear normal.  Left Ear: External ear normal.  Eyes: Conjunctivae and EOM are normal. Pupils are equal, round, and reactive to light.  Corrective lenses. Poor vision. Can only see light from left eye.  Neck: Neck supple. No JVD present. No tracheal deviation present. No thyromegaly present.  Cardiovascular: Normal rate, regular rhythm and intact distal pulses.  Exam reveals no gallop and no friction rub.   Murmur (2/6 aortic ejection murmur) heard. Pulmonary/Chest: Breath sounds normal. No respiratory distress. She has no wheezes. She exhibits no tenderness.  Abdominal: Soft. Bowel sounds are normal. She exhibits no distension. There is no tenderness.  Genitourinary:  Perirectal erythema and chafing from diarrhea recently.  Musculoskeletal: Normal range of motion. She exhibits no edema or tenderness.  Unstable gait. Using walker.  Neurological: She is alert and oriented to person, place, and time. She has normal reflexes. No cranial nerve deficit. Coordination normal.  memory deficit 01/10/15 MMSE 25/30. Passed clock drawing.  Skin: No rash noted. No erythema. No pallor.  Psychiatric: She has a normal mood and affect. Her behavior is normal. Thought content normal.     Labs reviewed: Lab Summary Latest Ref Rng  09/17/2015 08/28/2014 01/14/2014  Hemoglobin 12.0 - 16.0 g/dL 11.3(A) (None) 11.6(L)  Hematocrit 36 - 46 % 35(A) (None) 35.6(L)  White count - 4.3 (None) 4.8  Platelet count 150 - 399 K/L 180 (None) 192  Sodium 137 - 147 mmol/L 141 140 138  Potassium 3.4 - 5.3 mmol/L 5.0 4.7 4.4  Calcium 8.4 - 10.5 mg/dL (None) (None) 8.7  Phosphorus - (None) (None) (None)  Creatinine 0.5 - 1.1 mg/dL 1.2(A) 1.2(A) 1.12(H)  AST 13 - 35 U/L _1 Alk Phos 25 - 125 U/L 58 63 90  Bilirubin 0.3 - 1.2 mg/dL (None) (None) <0.2(L)  Glucose - 82 82 119(H)  Cholesterol 0 - 200 mg/dL (None) 207(A) (None)  HDL cholesterol 35 - 70 mg/dL (None) 80(A) (None)  Triglycerides 40 - 160 mg/dL (None) 70 (None)  LDL Direct - (None) (None) (None)  LDL Calc - (None) 113 (None)  Total protein 6.0 - 8.3 g/dL (None) (None) 6.2  Albumin  3.5 - 5.2 g/dL (None) (None) 3.3(L)   Lab Results  Component Value Date   TSH 1.260 04/06/2013   Lab Results  Component Value Date   BUN 25* 09/17/2015   BUN 24* 08/28/2014   BUN 29* 01/14/2014   Lab Results  Component Value Date   CREATININE 1.2* 09/17/2015   CREATININE 1.2* 08/28/2014   CREATININE 1.12* 01/14/2014   No results found for: HGBA1C     Assessment/Plan  1. Essential hypertension controlled  2. Alzheimer's disease of other onset without behavioral disturbance unchanged  3. Anxiety state improved  4. Anemia in chronic kidney disease CBC in future  5. Chronic kidney disease, stage 2 (mild) stable

## 2015-12-19 ENCOUNTER — Encounter (HOSPITAL_COMMUNITY): Payer: Self-pay | Admitting: Emergency Medicine

## 2015-12-19 ENCOUNTER — Emergency Department (HOSPITAL_COMMUNITY)
Admission: EM | Admit: 2015-12-19 | Discharge: 2015-12-20 | Disposition: A | Discharge status: Home / Self Care | Payer: Medicare Other | Attending: Emergency Medicine | Admitting: Emergency Medicine

## 2015-12-19 ENCOUNTER — Emergency Department (HOSPITAL_COMMUNITY): Payer: Medicare Other

## 2015-12-19 DIAGNOSIS — G8929 Other chronic pain: Secondary | ICD-10-CM | POA: Insufficient documentation

## 2015-12-19 DIAGNOSIS — S42021A Displaced fracture of shaft of right clavicle, initial encounter for closed fracture: Secondary | ICD-10-CM | POA: Diagnosis not present

## 2015-12-19 DIAGNOSIS — M25511 Pain in right shoulder: Secondary | ICD-10-CM | POA: Diagnosis not present

## 2015-12-19 DIAGNOSIS — Z79899 Other long term (current) drug therapy: Secondary | ICD-10-CM | POA: Insufficient documentation

## 2015-12-19 DIAGNOSIS — M1990 Unspecified osteoarthritis, unspecified site: Secondary | ICD-10-CM | POA: Diagnosis not present

## 2015-12-19 DIAGNOSIS — F419 Anxiety disorder, unspecified: Secondary | ICD-10-CM | POA: Diagnosis not present

## 2015-12-19 DIAGNOSIS — R1912 Hyperactive bowel sounds: Secondary | ICD-10-CM | POA: Insufficient documentation

## 2015-12-19 DIAGNOSIS — I10 Essential (primary) hypertension: Secondary | ICD-10-CM | POA: Insufficient documentation

## 2015-12-19 DIAGNOSIS — Z8701 Personal history of pneumonia (recurrent): Secondary | ICD-10-CM | POA: Diagnosis not present

## 2015-12-19 DIAGNOSIS — S42001A Fracture of unspecified part of right clavicle, initial encounter for closed fracture: Secondary | ICD-10-CM | POA: Diagnosis not present

## 2015-12-19 DIAGNOSIS — Y998 Other external cause status: Secondary | ICD-10-CM | POA: Diagnosis not present

## 2015-12-19 DIAGNOSIS — R011 Cardiac murmur, unspecified: Secondary | ICD-10-CM | POA: Insufficient documentation

## 2015-12-19 DIAGNOSIS — S4991XA Unspecified injury of right shoulder and upper arm, initial encounter: Secondary | ICD-10-CM | POA: Diagnosis not present

## 2015-12-19 DIAGNOSIS — H5462 Unqualified visual loss, left eye, normal vision right eye: Secondary | ICD-10-CM | POA: Insufficient documentation

## 2015-12-19 DIAGNOSIS — W010XXA Fall on same level from slipping, tripping and stumbling without subsequent striking against object, initial encounter: Secondary | ICD-10-CM | POA: Diagnosis not present

## 2015-12-19 DIAGNOSIS — Y9301 Activity, walking, marching and hiking: Secondary | ICD-10-CM | POA: Diagnosis not present

## 2015-12-19 DIAGNOSIS — G309 Alzheimer's disease, unspecified: Secondary | ICD-10-CM | POA: Diagnosis not present

## 2015-12-19 DIAGNOSIS — Z87891 Personal history of nicotine dependence: Secondary | ICD-10-CM | POA: Insufficient documentation

## 2015-12-19 DIAGNOSIS — Y92091 Bathroom in other non-institutional residence as the place of occurrence of the external cause: Secondary | ICD-10-CM | POA: Insufficient documentation

## 2015-12-19 DIAGNOSIS — Z9849 Cataract extraction status, unspecified eye: Secondary | ICD-10-CM | POA: Insufficient documentation

## 2015-12-19 DIAGNOSIS — H18893 Other specified disorders of cornea, bilateral: Secondary | ICD-10-CM | POA: Diagnosis not present

## 2015-12-19 DIAGNOSIS — Z961 Presence of intraocular lens: Secondary | ICD-10-CM | POA: Diagnosis not present

## 2015-12-19 MED ORDER — ACETAMINOPHEN 500 MG PO TABS
1000.0000 mg | ORAL_TABLET | Freq: Once | ORAL | Status: AC
  Administered 2015-12-20: 1000 mg via ORAL
  Filled 2015-12-19: qty 2

## 2015-12-19 MED ORDER — NAPROXEN 375 MG PO TABS
375.0000 mg | ORAL_TABLET | Freq: Once | ORAL | Status: AC
  Administered 2015-12-20: 375 mg via ORAL
  Filled 2015-12-19: qty 1

## 2015-12-19 NOTE — ED Notes (Signed)
Per daughter, pt had glass of wine and gin/tonic earlier this evening before returning to the nursing home where she fell while standing up from the bathroom

## 2015-12-19 NOTE — ED Notes (Signed)
Pt states she slipped and fell  Pt is c/o pain to her right shoulder  Pt has swelling and bruising noted to her shoulder  Pt states it hurts to move

## 2015-12-19 NOTE — ED Provider Notes (Signed)
CSN: [id number]     Arrival date & time 12/19/15  2214 History  By signing my name below, I, [name], attest that this documentation has been prepared under the direction and in the presence of Dima Ferrufino, MD. Electronically Signed: [name], ED Scribe. 12/19/2015. 11:58 PM.    Chief Complaint  Patient presents with  . Fall  . Shoulder Injury   Patient is a 82 y.o. female presenting with shoulder injury. The history is provided by the patient. No language interpreter was used.  Shoulder Injury This is a new problem. The current episode started 1 to 2 hours ago. The problem occurs constantly. The problem has not changed since onset.Pertinent negatives include no chest pain, no abdominal pain, no headaches and no shortness of breath. Nothing aggravates the symptoms. Nothing relieves the symptoms. She has tried nothing for the symptoms. The treatment provided no relief.  HPI Comments: Brittany Warren is a 82 y.o. female who presents to the Emergency Department complaining of right shoulder pain s/p a fall onto hard wood floors that occurred two hours ago.  The patient states I was walking through my house and "I just fell."  Did not strike head,  denies LOC, denies neck pain, denies hip pain, anti-coagulant use.    Past Medical History  Diagnosis Date  . Glaucoma     "both eyes; no vision in left eye as a result" (04/06/2013)  . Anxiety     mild  . PVC (premature ventricular contraction)   . History of recurrent UTIs   . Vision loss of left eye     "can't see at all out of it" (04/06/2013)  . SVT (supraventricular tachycardia) ([place])   . Aortic stenosis     a. Echo 03/2010:  EF 70-75%, mild AS  . Pneumonia 04/06/2013    "for the first time" (04/06/2013)  . Hypertension   . Closed pelvic fracture ([place]) 09/29/12    left nondisplaced inferior pelvic ramus  . Osteoarthritis 08/06/2011  . Anxiety 01/11/2014  . Chronic neck pain 08/06/2011  . Senile osteoporosis 04/08/2011  . Thumb pain  08/06/2011  . Cerebrovascular disease 11/01/2005    Small vessel disease  . Cerebral atrophy 01/10/2015  . Alzheimer's disease 01/10/2015   Past Surgical History  Procedure Laterality Date  . Tonsillectomy    . Appendectomy    . Glaucoma surgery Left     "put in stent for glaucoma" (04/06/2013)  . Cataract extraction, bilateral Bilateral    History reviewed. No pertinent family history. Social History  Substance Use Topics  . Smoking status: Former Smoker -- 0.50 packs/day for 30 years    Types: Cigarettes    Quit date: 01/14/1984  . Smokeless tobacco: Never Used  . Alcohol Use: 1.2 oz/week    2 Glasses of wine per week     Comment: 04/06/2013  "glass of wine 1-2X/week"   OB History    No data available     Review of Systems  Eyes: Negative for visual disturbance.  Respiratory: Negative for shortness of breath.   Cardiovascular: Negative for chest pain.  Gastrointestinal: Negative for vomiting and abdominal pain.  Musculoskeletal: Negative for myalgias, back pain and gait problem.  Neurological: Negative for dizziness, seizures, syncope, facial asymmetry, speech difficulty, weakness, numbness and headaches.  All other systems reviewed and are negative.  A complete 10 system review of systems was obtained and all systems are negative except as noted in the HPI and PMH.   Allergies  Codeine  Home Medications   Prior to Admission medications   Medication Sig Start Date End Date Taking? Authorizing Provider  acetaminophen (TYLENOL) 325 MG tablet Take 650 mg by mouth. Take 2 every 6 hours as needed   Yes Historical Provider, MD  dextran 70-hypromellose (TEARS RENEWED) ophthalmic solution 1 drop. One drop for four times in right eye   Yes Historical Provider, MD  donepezil (ARICEPT) 5 MG tablet One daily to help preserve memory 01/10/15  Yes Estill Dooms, MD  ketorolac (ACULAR LS) 0.4 % SOLN Place 1 drop into the left eye 4 (four) times daily as needed (pain).   Yes Historical  Provider, MD  LORazepam (ATIVAN) 0.5 MG tablet Take one tablet every 6 hours as needed for anxiety 08/26/15  Yes Historical Provider, MD  magnesium hydroxide (MILK OF MAGNESIA) 400 MG/5ML suspension Take by mouth. 2 tablespoon at bedtime for constipation   Yes Historical Provider, MD  mirtazapine (REMERON) 7.5 MG tablet Take 7.5 mg by mouth at bedtime.   Yes Historical Provider, MD  Multiple Vitamins-Minerals (ICAPS PO) Take 1 tablet by mouth daily.    Yes Historical Provider, MD  polyethylene glycol (MIRALAX / GLYCOLAX) packet Take 17 g by mouth daily as needed for mild constipation.   Yes Historical Provider, MD  VITAMIN C, CALCIUM ASCORBATE, PO Take 1 tablet by mouth daily.    Yes Historical Provider, MD  LOTEMAX 0.5 % GEL Apply 1 drop to eye daily. 07/09/15   Historical Provider, MD  Polyethyl Glycol-Propyl Glycol 0.4-0.3 % SOLN Apply to eye. One drop in right eye as needed    Historical Provider, MD  PROLENSA 0.07 % SOLN Apply 1 drop to eye daily. 05/17/15   Historical Provider, MD  tolterodine (DETROL) 2 MG tablet TAKE ONE TABLET TWICE DAILY 11/20/14   Elby Showers, MD   BP 165/78 mmHg  Pulse 90  Temp(Src) 97.7 F (36.5 C) (Oral)  Resp 16  SpO2 96% Physical Exam  Constitutional: She appears well-developed and well-nourished. No distress.  HENT:  Head: Normocephalic and atraumatic. Head is without raccoon's eyes and without Battle's sign.  Right Ear: No mastoid tenderness. No hemotympanum.  Left Ear: No mastoid tenderness. No hemotympanum.  Mouth/Throat: Oropharynx is clear and moist. No oropharyngeal exudate.  Cataract surgery with lens implant in the eye before 2002.  No battle sign.  No raccoon eyes.  No hemotympanum on the left or right.  Blind in left eye.   Eyes: Conjunctivae and EOM are normal.  Clouded cornea on the left, lens implant on the right  Neck: Normal range of motion. Neck supple. No tracheal deviation present.  Cardiovascular: Normal rate.   Murmur heard. RRR   Pulmonary/Chest: Effort normal and breath sounds normal. No respiratory distress. She has no wheezes. She has no rales. She exhibits no tenderness.  Lungs CTA.  Abdominal: Soft. There is no tenderness. There is no rebound and no guarding.  Bowel sounds up to the neck. Hyperactive bowel sounds. Pelvis is stable without hip tenderness   Musculoskeletal: Normal range of motion. She exhibits no edema or tenderness.       Right shoulder: Normal. She exhibits normal range of motion, no tenderness, no bony tenderness, no swelling, no effusion and no crepitus.       Right elbow: Normal.      Right wrist: Normal.       Right hip: Normal.       Left hip: Normal.  Cervical back: Normal.       Thoracic back: Normal.       Lumbar back: Normal.       Right forearm: Normal.       Right hand: Normal. She exhibits normal capillary refill. Normal sensation noted. Normal strength noted.  l  Neurological: She is alert. She has normal reflexes. She displays normal reflexes.  DP pulse intact.   Skin: Skin is warm and dry.  Psychiatric: She has a normal mood and affect. Her behavior is normal.  Nursing note and vitals reviewed.   ED Course  Procedures (including critical care time)  DIAGNOSTIC STUDIES: Oxygen Saturation is 96% on RA, normal by my interpretation.    COORDINATION OF CARE:  11:12 PM Awaiting imaging results of right shoulder.  Patient acknowledges and agrees with plan.    Labs Review Labs Reviewed - No data to display  Imaging Review Dg Shoulder Right  12/19/2015  CLINICAL DATA:  [place], pain. EXAM: RIGHT SHOULDER - 2+ VIEW COMPARISON:  None. FINDINGS: There is no evidence of fracture or dislocation. There is no evidence of arthropathy or other focal bone abnormality. Soft tissues are unremarkable. IMPRESSION: Negative. Electronically Signed   By: Staci Righter M.D.   On: 12/19/2015 23:02   I have personally reviewed and evaluated these images and lab results as part of my medical  decision-making.   EKG Interpretation None      MDM   Final diagnoses:  None    Placed in clavicle strap tylenol and voltaren.  Follow up with orthopedics.  Patient and family verbalize understanding and agree to follow up  I personally performed the services described in this documentation, which was scribed in my presence. The recorded information has been reviewed and is accurate.      Veatrice Kells, MD 12/20/15 Laureen Abrahams

## 2015-12-20 ENCOUNTER — Encounter (HOSPITAL_COMMUNITY): Payer: Self-pay | Admitting: Emergency Medicine

## 2015-12-20 DIAGNOSIS — S42001A Fracture of unspecified part of right clavicle, initial encounter for closed fracture: Secondary | ICD-10-CM | POA: Diagnosis not present

## 2015-12-20 MED ORDER — DICLOFENAC SODIUM ER 100 MG PO TB24
100.0000 mg | ORAL_TABLET | Freq: Every day | ORAL | Status: DC
Start: 2015-12-20 — End: 2016-01-31

## 2015-12-20 NOTE — ED Notes (Signed)
Secretary Debbie to Astronomer at [facility]

## 2015-12-20 NOTE — ED Notes (Signed)
MD Palumbo notified of increased BP of 192/84; Palumbo states to discharge pt anyway

## 2015-12-20 NOTE — ED Notes (Signed)
MD at bedside. 

## 2015-12-20 NOTE — Discharge Instructions (Signed)
Clavicle Fracture The clavicle, also called the collarbone, is the long bone that connects your shoulder to your rib cage. You can feel your collarbone at the top of your shoulders and rib cage. A clavicle fracture is a broken clavicle. It is a common injury that can happen at any age.  CAUSES Common causes of a clavicle fracture include:  A direct blow to your shoulder.  A car accident.  A fall, especially if you try to break your fall with an outstretched arm. RISK FACTORS You may be at increased risk if:  You are younger than 25 years or older than 67 years. Most clavicle fractures happen to people who are younger than 25 years.  You are a female.  You play contact sports. SIGNS AND SYMPTOMS A fractured clavicle is painful. It also makes it hard to move your arm. Other signs and symptoms may include:  A shoulder that drops downward and forward.  Pain when trying to lift your shoulder.  Bruising, swelling, and tenderness over your clavicle.  A grinding noise when you try to move your shoulder.  A bump over your clavicle. DIAGNOSIS Your health care provider can usually diagnose a clavicle fracture by asking about your injury and examining your shoulder and clavicle. He or she may take an X-ray to determine the position of your clavicle. TREATMENT Treatment depends on the position of your clavicle after the fracture:  If the broken ends of the bone are not out of place, your health care provider may put your arm in a sling or wrap a support bandage around your chest (figure-of-eight wrap).  If the broken ends of the bone are out of place, you may need surgery. Surgery may involve placing screws, pins, or plates to keep your clavicle stable while it heals. Healing may take about 3 months. When your health care provider thinks your fracture has healed enough, you may have to do physical therapy to regain normal movement and build up your arm strength. HOME CARE INSTRUCTIONS    Apply ice to the injured area:  Put ice in a plastic bag.  Place a towel between your skin and the bag.  Leave the ice on for 20 minutes, 2-3 times a day.  If you have a wrap or splint:  Wear it all the time, and remove it only to take a bath or shower.  When you bathe or shower, keep your shoulder in the same position as when the sling or wrap is on.  Do not lift your arm.  If you have a figure-of-eight wrap:  Another person must tighten it every day.  It should be tight enough to hold your shoulders back.  Allow enough room to place your index finger between your body and the strap.  Loosen the wrap immediately if you feel numbness or tingling in your hands.  Only take medicines as directed by your health care provider.  Avoid activities that make the injury or pain worse for 4-6 weeks after surgery.  Keep all follow-up appointments. SEEK MEDICAL CARE IF:  Your medicine is not helping to relieve pain and swelling. SEEK IMMEDIATE MEDICAL CARE IF:  Your arm is numb, cold, or pale, even when the splint is loose. MAKE SURE YOU:   Understand these instructions.  Will watch your condition.  Will get help right away if you are not doing well or get worse.   This information is not intended to replace advice given to you by your health care provider.  numb, cold, or pale, even when the splint is loose.  MAKE SURE YOU:   · Understand these instructions.  · Will watch your condition.  · Will get help right away if you are not doing well or get worse.     This information is not intended to replace advice given to you by your health care provider. Make sure you discuss any questions you have with your health care provider.     Document Released: 08/05/2005 Document Revised: 10/31/2013 Document Reviewed: 09/18/2013  Elsevier Interactive Patient Education ©2016 [employer]

## 2015-12-20 NOTE — ED Notes (Signed)
Delay in medication administration due to being sent from pharmacy

## 2015-12-20 NOTE — ED Notes (Signed)
Delay in application of clavicle strap; Ortho Tech from [facility] en route to apply

## 2015-12-23 ENCOUNTER — Encounter: Payer: Self-pay | Admitting: Nurse Practitioner

## 2015-12-23 ENCOUNTER — Non-Acute Institutional Stay (SKILLED_NURSING_FACILITY): Payer: Medicare Other | Admitting: Nurse Practitioner

## 2015-12-23 DIAGNOSIS — K5900 Constipation, unspecified: Secondary | ICD-10-CM

## 2015-12-23 DIAGNOSIS — G308 Other Alzheimer's disease: Secondary | ICD-10-CM

## 2015-12-23 DIAGNOSIS — I471 Supraventricular tachycardia: Secondary | ICD-10-CM

## 2015-12-23 DIAGNOSIS — N189 Chronic kidney disease, unspecified: Secondary | ICD-10-CM | POA: Diagnosis not present

## 2015-12-23 DIAGNOSIS — I10 Essential (primary) hypertension: Secondary | ICD-10-CM | POA: Diagnosis not present

## 2015-12-23 DIAGNOSIS — N182 Chronic kidney disease, stage 2 (mild): Secondary | ICD-10-CM | POA: Diagnosis not present

## 2015-12-23 DIAGNOSIS — S4991XD Unspecified injury of right shoulder and upper arm, subsequent encounter: Secondary | ICD-10-CM

## 2015-12-23 DIAGNOSIS — F411 Generalized anxiety disorder: Secondary | ICD-10-CM

## 2015-12-23 DIAGNOSIS — N318 Other neuromuscular dysfunction of bladder: Secondary | ICD-10-CM | POA: Diagnosis not present

## 2015-12-23 DIAGNOSIS — D631 Anemia in chronic kidney disease: Secondary | ICD-10-CM

## 2015-12-23 DIAGNOSIS — S4991XA Unspecified injury of right shoulder and upper arm, initial encounter: Secondary | ICD-10-CM | POA: Insufficient documentation

## 2015-12-23 DIAGNOSIS — F0280 Dementia in other diseases classified elsewhere without behavioral disturbance: Secondary | ICD-10-CM

## 2015-12-23 NOTE — Assessment & Plan Note (Signed)
Continue to decline cognitively, taking Aricept, recent fall, the right shoulder injury, in sling for support.

## 2015-12-23 NOTE — Assessment & Plan Note (Signed)
09/17/15 Hgb 11.3

## 2015-12-23 NOTE — Progress Notes (Signed)
Patient ID: Brittany Warren, female   DOB: [birth year], 82 y.o.   MRN: [id number]  Location:  SNF [facility] Provider:  Marlana Latus NP  Code Status:  DNR Goals of care: Advanced Directive information Does patient have an advance directive?: Yes, Type of Advance Directive: [place];Out of facility DNR (pink MOST or yellow form)  Chief Complaint  Patient presents with  . Hospitalization Follow-up    Patient returning from Hospital to Skilled nursing      HPI: Patient is a 82 y.o. female seen in the SNF at [facility] today for evaluation of chronic medical conditions. She fell 12/19/15, ED eval for the right shoulder injury. SNF for Hx of dementia, taking Aricept presently. She needs MOM 30mg  qhs and MiraLax for constipation, Lorazepam for anxiety/sleep but still lots of anxiety, poor appetite, weight loss, Metoprolol 25mg  for heart rate as needed, prn Morphine for pain.    12/19/15 ED eval, fall and the right shoulder injury, X-ray was negative for fx or dislocation.   Review of Systems:  Review of Systems  Constitutional: Positive for weight loss. Negative for fever, chills, malaise/fatigue and diaphoresis.  HENT: Positive for hearing loss. Negative for congestion, ear discharge, ear pain, nosebleeds and tinnitus.   Eyes: Negative for blurred vision, double vision, photophobia, pain and discharge.       Poor vision and glaucoma. Left eye has no vision  Respiratory: Negative.  Negative for cough, hemoptysis, sputum production, shortness of breath and wheezing.   Cardiovascular: Negative for chest pain, palpitations and leg swelling.  Gastrointestinal: Positive for constipation. Negative for heartburn, nausea, vomiting, abdominal pain, diarrhea and blood in stool.  Genitourinary: Positive for frequency. Negative for dysuria, urgency and hematuria.  Musculoskeletal: Positive for joint pain and falls. Negative for myalgias, back pain and neck pain.       The right shoulder  pain with overhead ROM  Skin: Negative.  Negative for itching and rash.  Neurological: Negative.  Negative for dizziness, tingling, tremors, sensory change, speech change, focal weakness and weakness.       Forgetful.  Endo/Heme/Allergies: Does not bruise/bleed easily.  Psychiatric/Behavioral: Positive for depression and memory loss. Negative for suicidal ideas, hallucinations and substance abuse. The patient is nervous/anxious. The patient does not have insomnia.     Past Medical History  Diagnosis Date  . Glaucoma     "both eyes; no vision in left eye as a result" (04/06/2013)  . Anxiety     mild  . PVC (premature ventricular contraction)   . History of recurrent UTIs   . Vision loss of left eye     "can't see at all out of it" (04/06/2013)  . SVT (supraventricular tachycardia) ([place])   . Aortic stenosis     a. Echo 03/2010:  EF 70-75%, mild AS  . Pneumonia 04/06/2013    "for the first time" (04/06/2013)  . Hypertension   . Closed pelvic fracture ([place]) 09/29/12    left nondisplaced inferior pelvic ramus  . Osteoarthritis 08/06/2011  . Anxiety 01/11/2014  . Chronic neck pain 08/06/2011  . Senile osteoporosis 04/08/2011  . Thumb pain 08/06/2011  . Cerebrovascular disease 11/01/2005    Small vessel disease  . Cerebral atrophy 01/10/2015  . Alzheimer's disease 01/10/2015    Patient Active Problem List   Diagnosis Date Noted  . Right shoulder injury 12/23/2015  . Anemia in chronic kidney disease 09/18/2015  . Chronic kidney disease 09/18/2015  . Constipation 09/12/2015  . Loss of  weight 05/16/2015  . 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 Cerebral atrophy 01/10/2015  . Alzheimer's disease 01/10/2015  . Vision loss of left eye   . Anxiety state 01/11/2014  . DNR (do not resuscitate) 04/13/2013  . 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 Aortic stenosis   . SVT (supraventricular tachycardia) ([place])   . Chronic neck pain 08/06/2011  . Osteoarthritis 08/06/2011  . Thumb pain 08/06/2011  . HTN  (hypertension) 07/07/2011  . Glaucoma 04/08/2011  . History of recurrent UTIs 04/08/2011  . Osteoporosis 04/08/2011  . PALPITATIONS 05/27/2010  . Cerebrovascular disease 11/01/2005    Allergies  Allergen Reactions  . Codeine Nausea Only    Medications: Patient's Medications  New Prescriptions   No medications on file  Previous Medications   ACETAMINOPHEN (TYLENOL) 325 MG TABLET    Take 650 mg by mouth every 4 (four) hours as needed. Take 2 every 6 hours as needed   CALCIUM CITRATE-VITAMIN D 315-250 MG-UNIT TABS    Take by mouth. Take one tablet daily   DICLOFENAC SODIUM CR 100 MG 24 HR TABLET    Take 1 tablet (100 mg total) by mouth daily.   DONEPEZIL (ARICEPT) 5 MG TABLET    One daily to help preserve memory   KETOROLAC (ACULAR LS) 0.4 % SOLN    Place 1 drop into the left eye 4 (four) times daily as needed (pain).   MAGNESIUM HYDROXIDE (MILK OF MAGNESIA) 400 MG/5ML SUSPENSION    Take by mouth. 2 tablespoon at bedtime for constipation   MIRTAZAPINE (REMERON) 7.5 MG TABLET    Take 7.5 mg by mouth at bedtime.   MULTIPLE VITAMINS-MINERALS (ICAPS PO)    Take 1 tablet by mouth daily.    POLYETHYLENE GLYCOL (MIRALAX / GLYCOLAX) PACKET    Take 17 g by mouth daily as needed for mild constipation.   VITAMIN C (ASCORBIC ACID) 500 MG TABLET    Take 500 mg by mouth daily.  Modified Medications   No medications on file  Discontinued Medications   DEXTRAN 70-HYPROMELLOSE (TEARS RENEWED) OPHTHALMIC SOLUTION    Place 1 drop into the right eye 4 (four) times daily as needed for dry eyes.    LORAZEPAM (ATIVAN) 0.5 MG TABLET    Reported on 12/23/2015   TOLTERODINE (DETROL) 2 MG TABLET    TAKE ONE TABLET TWICE DAILY   VITAMIN C, CALCIUM ASCORBATE, PO    Take 1 tablet by mouth daily.     Physical Exam: Filed Vitals:   12/23/15 1514  BP: 132/80  Pulse: 68  Temp: 97.9 F (36.6 C)  TempSrc: Oral  Resp: 18   There is no weight on file to calculate BMI.  Physical Exam  Constitutional: She is  oriented to person, place, and time. She appears well-developed and well-nourished. No distress.  HENT:  Head: Normocephalic and atraumatic.  Right Ear: External ear normal.  Left Ear: External ear normal.  Eyes: Conjunctivae and EOM are normal. Pupils are equal, round, and reactive to light.  Corrective lenses. Poor vision. Can only see light from left eye.  Neck: Neck supple. No JVD present. No tracheal deviation present. No thyromegaly present.  Cardiovascular: Normal rate, regular rhythm and intact distal pulses.  Exam reveals no gallop and no friction rub.   Murmur (2/6 aortic ejection murmur) heard. Pulmonary/Chest: Breath sounds normal. No respiratory distress. She has no wheezes. She exhibits no tenderness.  Abdominal: Soft. Bowel sounds are normal. She exhibits no distension. There is no tenderness.  Musculoskeletal: Normal  range of motion. She exhibits no edema or tenderness.  Unstable gait. Using walker. The right shoulder pain with overhead ROM, in sling, f/u Ortho  Neurological: She is alert and oriented to person, place, and time. She has normal reflexes. No cranial nerve deficit. Coordination normal.  memory deficit 01/10/15 MMSE 25/30. Passed clock drawing.  Skin: No rash noted. No erythema. No pallor.  Psychiatric: She has a normal mood and affect. Her behavior is normal. Thought content normal.  Lots of anxiety    Labs reviewed: Basic Metabolic Panel:  Recent Labs  09/17/15  NA 141  K 5.0  BUN 25*  CREATININE 1.2*    Liver Function Tests:  Recent Labs  09/17/15  AST 17  ALT 15  ALKPHOS 58    CBC:  Recent Labs  09/17/15  WBC 4.3  HGB 11.3*  HCT 35*  PLT 180    Lab Results  Component Value Date   TSH 1.260 04/06/2013   No results found for: HGBA1C Lab Results  Component Value Date   CHOL 207* 08/28/2014   HDL 80* 08/28/2014   LDLCALC 113 08/28/2014   TRIG 70 08/28/2014    Significant Diagnostic Results since last visit: none  Patient  Care Team: Estill Dooms, MD as PCP - General (Internal Medicine) Cerulean Minus Breeding, MD as Consulting Physician (Cardiology) R Crecencio Mc, MD as Consulting Physician (Ophthalmology) Leta Baptist, MD as Consulting Physician (Otolaryngology)  Assessment/Plan Problem List Items Addressed This Visit    HTN (hypertension) (Chronic)    Controlled, has prn Metoprolol 25mg  available to her for occasional elevated BP/P      Alzheimer's disease (Chronic)    Continue to decline cognitively, taking Aricept, recent fall, the right shoulder injury, in sling for support.       SVT (supraventricular tachycardia) ([facility])    Heart rate is in control, prn Metoprolol 25mg  available       Hypertonicity of bladder    Managed with adult brief, not on med      Anxiety state    Prn Lorazepam available to her. Seems better, continue Mirtazapine 7.5mg  qhs in setting of weight loss.      Constipation    MOM 39ml qhs.       Anemia in chronic kidney disease    09/17/15 Hgb 11.3      Chronic kidney disease - Primary    09/17/15 Bun 25, creat 1.22      Right shoulder injury    Continue sling for support, SNF for care needs, intensive supervision for safety, fall risk          Family/ staff Communication: SNF for care needs.   Labs/tests ordered: none  ManXie Bailea Beed NP [facility] [address], [place] [address] On Call:  [phone] & follow prompts after 5pm & weekends Office Phone:  [phone] Office Fax:  [phone]

## 2015-12-23 NOTE — Assessment & Plan Note (Signed)
MOM 30ml qhs.  

## 2015-12-23 NOTE — Assessment & Plan Note (Signed)
Heart rate is in control, prn Metoprolol 25mg available  

## 2015-12-23 NOTE — Assessment & Plan Note (Signed)
Prn Lorazepam available to her. Seems better, continue Mirtazapine 7.5mg  qhs in setting of weight loss.

## 2015-12-23 NOTE — Assessment & Plan Note (Signed)
09/17/15 Bun 25, creat 1.22

## 2015-12-23 NOTE — Assessment & Plan Note (Signed)
Controlled, has prn Metoprolol 25mg available to her for occasional elevated BP/P 

## 2015-12-23 NOTE — Assessment & Plan Note (Signed)
Managed with adult brief, not on med 

## 2015-12-23 NOTE — Assessment & Plan Note (Signed)
Continue sling for support, SNF for care needs, intensive supervision for safety, fall risk

## 2015-12-23 NOTE — Progress Notes (Signed)
Patient ID: Brittany Warren, female   DOB: April 18, 1917, 82 y.o.   MRN: [id number]

## 2015-12-24 DIAGNOSIS — S42024A Nondisplaced fracture of shaft of right clavicle, initial encounter for closed fracture: Secondary | ICD-10-CM | POA: Diagnosis not present

## 2015-12-31 DIAGNOSIS — H5212 Myopia, left eye: Secondary | ICD-10-CM | POA: Diagnosis not present

## 2015-12-31 DIAGNOSIS — H52223 Regular astigmatism, bilateral: Secondary | ICD-10-CM | POA: Diagnosis not present

## 2015-12-31 DIAGNOSIS — H524 Presbyopia: Secondary | ICD-10-CM | POA: Diagnosis not present

## 2015-12-31 DIAGNOSIS — H18421 Band keratopathy, right eye: Secondary | ICD-10-CM | POA: Diagnosis not present

## 2015-12-31 DIAGNOSIS — H5201 Hypermetropia, right eye: Secondary | ICD-10-CM | POA: Diagnosis not present

## 2016-01-02 ENCOUNTER — Non-Acute Institutional Stay (SKILLED_NURSING_FACILITY): Payer: Medicare Other | Admitting: Internal Medicine

## 2016-01-02 DIAGNOSIS — R32 Unspecified urinary incontinence: Secondary | ICD-10-CM | POA: Insufficient documentation

## 2016-01-02 DIAGNOSIS — I10 Essential (primary) hypertension: Secondary | ICD-10-CM

## 2016-01-02 DIAGNOSIS — S42001A Fracture of unspecified part of right clavicle, initial encounter for closed fracture: Secondary | ICD-10-CM | POA: Insufficient documentation

## 2016-01-02 DIAGNOSIS — N39498 Other specified urinary incontinence: Secondary | ICD-10-CM | POA: Diagnosis not present

## 2016-01-02 DIAGNOSIS — G308 Other Alzheimer's disease: Secondary | ICD-10-CM | POA: Diagnosis not present

## 2016-01-02 DIAGNOSIS — F0280 Dementia in other diseases classified elsewhere without behavioral disturbance: Secondary | ICD-10-CM

## 2016-01-02 NOTE — Progress Notes (Signed)
Patient ID: Brittany Warren, female   DOB: November 25, 1916, 82 y.o.   MRN: [id number]  Provider: Dr. Jeanmarie Hubert   Location:  [place] Room Number: 53 Place of Service:  SNF (31)  PCP: Estill Dooms, MD Patient Care Team: Estill Dooms, MD as PCP - General (Internal Medicine) [place] Minus Breeding, MD as Consulting Physician (Cardiology) R Crecencio Mc, MD as Consulting Physician (Ophthalmology) Leta Baptist, MD as Consulting Physician (Otolaryngology)  Extended Emergency Contact Information Primary Emergency Contact: Vinson Moselle of [place] Phone: [phone] Mobile Phone: [phone] Relation: Friend Secondary Emergency Contact: Daniel,Gloria Address: [place], [place] of [place] Phone: [phone] Mobile Phone: [phone] Relation: Friend  Code Status: DNR Goals of Care: Advanced Directive information Advanced Directives 01/02/2016  Does patient have an advance directive? Yes  Type of Paramedic of [place];Living will;Out of facility DNR (pink MOST or yellow form)  Copy of advanced directive(s) in chart? Yes      Chief Complaint  Patient presents with  . New Admit To SNF    New Admit to skilled from [place]. Right clavicle Fracture    HPI: Patient is a 82 y.o. female [facility] Patient was admitted to [facility]. She had a fall and sustained a fracture of the right clavicle 12/19/2015. She was seen in the emergency room for this and returns to [facility] assisted living. Patient was unable to manage activities of daily living without extensive assistance so she was moved to the skilled nursing facility area.  Other problems include confusion with memory deficit, constipation, glaucoma, chronic anxiety, incontinence of urine, mitral and aortic stenoses, generalized osteoarthritis.  Patient  complains of pain in the right shoulder and upper chest. She is cooperative. There is a deformity of the right clavicle and extensive bruising of the right chest. She is unstable when walking 4 wheel walker with brakes and a seat. She will need additional assistance in most activities due to the fracture and pain in the right clavicular area.  Past Medical History  Diagnosis Date  . Glaucoma     "both eyes; no vision in left eye as a result" (04/06/2013)  . Anxiety     mild  . PVC (premature ventricular contraction)   . History of recurrent UTIs   . Vision loss of left eye     "can't see at all out of it" (04/06/2013)  . SVT (supraventricular tachycardia) ([place])   . Aortic stenosis     a. Echo 03/2010:  EF 70-75%, mild AS  . Pneumonia 04/06/2013    "for the first time" (04/06/2013)  . Hypertension   . Closed pelvic fracture ([place]) 09/29/12    left nondisplaced inferior pelvic ramus  . Osteoarthritis 08/06/2011  . Anxiety 01/11/2014  . Chronic neck pain 08/06/2011  . Senile osteoporosis 04/08/2011  . Thumb pain 08/06/2011  . Cerebrovascular disease 11/01/2005    Small vessel disease  . Cerebral atrophy 01/10/2015  . Alzheimer's disease 01/10/2015   Past Surgical History  Procedure Laterality Date  . Tonsillectomy    . Appendectomy    . Glaucoma surgery Left     "put in stent for glaucoma" (04/06/2013)  . Cataract extraction, bilateral Bilateral     reports that she quit smoking about 31 years ago. Her smoking use included Cigarettes. She has a 15 pack-year smoking  history. She has never used smokeless tobacco. She reports that she drinks about 1.2 oz of alcohol per week. She reports that she does not use illicit drugs. Social History   Social History  . Marital Status: Widowed    Spouse Name: N/A  . Number of Children: N/A  . Years of Education: N/A   Occupational History  . retired Pharmacist, hospital    Social History Main Topics  . Smoking status: Former Smoker -- 0.50 packs/day for 30 years      Types: Cigarettes    Quit date: 01/14/1984  . Smokeless tobacco: Never Used  . Alcohol Use: 1.2 oz/week    2 Glasses of wine per week     Comment: 04/06/2013  "glass of wine 1-2X/week"  . Drug Use: No  . Sexual Activity: No   Other Topics Concern  . Not on file   Social History Narrative   L[facility] at [facility],  [place] 08/08/[place]   Widowed   Previously smoked 1/2 PPD, smoked 30-40 years stopped [place] with walker   Exercise with machines, and walk   Alcohol occasionally    DNR, POA   Body has been bequeathed to [facility].                 Functional Status Survey:    No family history on file.  Health Maintenance  Topic Date Due  . DEXA SCAN  03/03/1984  . PNA vac Low Risk Adult (1 of 2 - PCV13) 03/03/1984  . INFLUENZA VACCINE  06/09/2016  . TETANUS/TDAP  09/13/2022  . ZOSTAVAX  Addressed    Allergies  Allergen Reactions  . Codeine Nausea Only      Medication List       This list is accurate as of: 01/02/16  2:25 PM.  Always use your most recent med list.               acetaminophen 325 MG tablet  Commonly known as:  TYLENOL  Take 650 mg by mouth every 4 (four) hours as needed. Take 2 every 6 hours as needed     ACULAR LS 0.4 % Soln  Generic drug:  ketorolac  Place 1 drop into the left eye 4 (four) times daily as needed (pain).     Calcium Citrate-Vitamin D 315-250 MG-UNIT Tabs  Take by mouth. Take one tablet daily     Diclofenac Sodium CR 100 MG 24 hr tablet  Take 1 tablet (100 mg total) by mouth daily.     donepezil 5 MG tablet  Commonly known as:  ARICEPT  One daily to help preserve memory     ICAPS PO  Take 1 tablet by mouth daily.     magnesium hydroxide 400 MG/5ML suspension  Commonly known as:  MILK OF MAGNESIA  Take by mouth. 2 tablespoon at bedtime for constipation     mirtazapine 7.5 MG tablet  Commonly known as:  REMERON  Take 7.5 mg by mouth at bedtime.     polyethylene glycol  packet  Commonly known as:  MIRALAX / GLYCOLAX  Take 17 g by mouth daily as needed for mild constipation.     vitamin C 500 MG tablet  Commonly known as:  ASCORBIC ACID  Take 500 mg by mouth daily.        Review of Systems  Constitutional: Negative for fever, chills, diaphoresis, activity change, appetite change, fatigue and unexpected weight change.  Regained 2 # since Nov 2016  HENT: Negative for congestion, ear discharge, ear pain, hearing loss, postnasal drip, rhinorrhea, sore throat, tinnitus, trouble swallowing and voice change.   Eyes: Negative for pain, redness, itching and visual disturbance.       Poor vision and glaucoma.  Respiratory: Negative for cough, choking, shortness of breath and wheezing.   Cardiovascular: Negative for chest pain, palpitations and leg swelling.  Gastrointestinal: Negative for nausea, abdominal pain, diarrhea, constipation and abdominal distention.  Endocrine: Negative for cold intolerance, heat intolerance, polydipsia, polyphagia and polyuria.  Genitourinary: Negative for dysuria, urgency, frequency, hematuria, flank pain, vaginal discharge, difficulty urinating and pelvic pain.  Musculoskeletal: Positive for gait problem (using 4 wheel walker with seat and brakes). Negative for myalgias, back pain, arthralgias, neck pain and neck stiffness.       Fracture of the right clavicle 12/19/2015.  Skin: Negative for color change, pallor and rash.  Neurological: Negative for dizziness, tremors, seizures, syncope, weakness, numbness and headaches.       Forgetful.  Hematological: Negative for adenopathy. Does not bruise/bleed easily.  Psychiatric/Behavioral: Negative for suicidal ideas, hallucinations, behavioral problems, confusion, sleep disturbance, dysphoric mood and agitation. The patient is not nervous/anxious and is not hyperactive.     Filed Vitals:   01/02/16 1421  BP: 118/65  Pulse: 65  Temp: 97.7 F (36.5 C)  TempSrc: Oral  Resp: 20    Height: 5\' 5"  (1.651 m)  Weight: 103 lb (46.72 kg)   Body mass index is 17.14 kg/(m^2). Physical Exam  Constitutional: She is oriented to person, place, and time. She appears well-developed and well-nourished. No distress.  HENT:  Head: Normocephalic and atraumatic.  Right Ear: External ear normal.  Left Ear: External ear normal.  Eyes: Conjunctivae and EOM are normal. Pupils are equal, round, and reactive to light.  Corrective lenses. Poor vision. Can only see light from left eye.  Neck: Neck supple. No JVD present. No tracheal deviation present. No thyromegaly present.  Cardiovascular: Normal rate, regular rhythm and intact distal pulses.  Exam reveals no gallop and no friction rub.   Murmur (2/6 aortic ejection murmur) heard. Pulmonary/Chest: Breath sounds normal. No respiratory distress. She has no wheezes. She exhibits no tenderness.  Abdominal: Soft. Bowel sounds are normal. She exhibits no distension. There is no tenderness.  Genitourinary:  Perirectal erythema and chafing from diarrhea recently.  Musculoskeletal: Normal range of motion. She exhibits no edema or tenderness.  Unstable gait. Using walker. Fracture deformity. Extensive bruising of the upper right chest.  Neurological: She is alert and oriented to person, place, and time. She has normal reflexes. No cranial nerve deficit. Coordination normal.  memory deficit 01/10/15 MMSE 25/30. Passed clock drawing.  Skin: No rash noted. No erythema. No pallor.  Psychiatric: She has a normal mood and affect. Her behavior is normal. Thought content normal.    Labs reviewed: Basic Metabolic Panel:  Recent Labs  09/17/15  NA 141  K 5.0  BUN 25*  CREATININE 1.2*   Liver Function Tests:  Recent Labs  09/17/15  AST 17  ALT 15  ALKPHOS 58   No results for input(s): LIPASE, AMYLASE in the last 8760 hours. No results for input(s): AMMONIA in the last 8760 hours. CBC:  Recent Labs  09/17/15  WBC 4.3  HGB 11.3*  HCT 35*   PLT 180   Cardiac Enzymes: No results for input(s): CKTOTAL, CKMB, CKMBINDEX, TROPONINI in the last 8760 hours. BNP: Invalid input(s): POCBNP No results found for:  HGBA1C Lab Results  Component Value Date   TSH 1.260 04/06/2013   No results found for: VITAMINB12 No results found for: FOLATE No results found for: IRON, TIBC, FERRITIN  Imaging and Procedures obtained prior to SNF admission: Dg Shoulder Right  12/20/2015  ADDENDUM REPORT: 12/19/2015 23:59 ADDENDUM: After further clinical information concern for clavicular tenderness, there is a midshaft clavicle deformity consistent with acute fracture. I discussed these findings with the EDP. Electronically Signed   By: Staci Righter M.D.   On: 12/19/2015 23:59  12/19/2015  CLINICAL DATA:  [place], pain. EXAM: RIGHT SHOULDER - 2+ VIEW COMPARISON:  None. FINDINGS: There is no evidence of fracture or dislocation. There is no evidence of arthropathy or other focal bone abnormality. Soft tissues are unremarkable. IMPRESSION: Negative. Electronically Signed: By: Staci Righter M.D. On: 12/19/2015 23:02    Assessment/Plan 1. Fracture of right clavicle, closed, initial encounter Observe. Assist as needed. Supportive care. Pain control.  2. Other urinary incontinence Incontinence diapers.  3. Essential hypertension Controlled  4. Alzheimer's disease of other onset Using donepezil 5 mg daily

## 2016-01-03 DIAGNOSIS — R41841 Cognitive communication deficit: Secondary | ICD-10-CM | POA: Diagnosis not present

## 2016-01-06 ENCOUNTER — Telehealth: Payer: Self-pay

## 2016-01-06 DIAGNOSIS — R41841 Cognitive communication deficit: Secondary | ICD-10-CM | POA: Diagnosis not present

## 2016-01-07 NOTE — Telephone Encounter (Signed)
error 

## 2016-01-09 DIAGNOSIS — R29898 Other symptoms and signs involving the musculoskeletal system: Secondary | ICD-10-CM | POA: Diagnosis not present

## 2016-01-09 DIAGNOSIS — M6281 Muscle weakness (generalized): Secondary | ICD-10-CM | POA: Diagnosis not present

## 2016-01-09 DIAGNOSIS — Z9181 History of falling: Secondary | ICD-10-CM | POA: Diagnosis not present

## 2016-01-13 DIAGNOSIS — Z9181 History of falling: Secondary | ICD-10-CM | POA: Diagnosis not present

## 2016-01-13 DIAGNOSIS — R29898 Other symptoms and signs involving the musculoskeletal system: Secondary | ICD-10-CM | POA: Diagnosis not present

## 2016-01-13 DIAGNOSIS — M6281 Muscle weakness (generalized): Secondary | ICD-10-CM | POA: Diagnosis not present

## 2016-01-14 DIAGNOSIS — S42024D Nondisplaced fracture of shaft of right clavicle, subsequent encounter for fracture with routine healing: Secondary | ICD-10-CM | POA: Diagnosis not present

## 2016-01-15 DIAGNOSIS — M6281 Muscle weakness (generalized): Secondary | ICD-10-CM | POA: Diagnosis not present

## 2016-01-15 DIAGNOSIS — Z9181 History of falling: Secondary | ICD-10-CM | POA: Diagnosis not present

## 2016-01-15 DIAGNOSIS — R29898 Other symptoms and signs involving the musculoskeletal system: Secondary | ICD-10-CM | POA: Diagnosis not present

## 2016-01-17 DIAGNOSIS — M6281 Muscle weakness (generalized): Secondary | ICD-10-CM | POA: Diagnosis not present

## 2016-01-17 DIAGNOSIS — R29898 Other symptoms and signs involving the musculoskeletal system: Secondary | ICD-10-CM | POA: Diagnosis not present

## 2016-01-17 DIAGNOSIS — Z9181 History of falling: Secondary | ICD-10-CM | POA: Diagnosis not present

## 2016-01-20 ENCOUNTER — Telehealth: Payer: Self-pay | Admitting: Cardiology

## 2016-01-20 DIAGNOSIS — M6281 Muscle weakness (generalized): Secondary | ICD-10-CM | POA: Diagnosis not present

## 2016-01-20 DIAGNOSIS — Z9181 History of falling: Secondary | ICD-10-CM | POA: Diagnosis not present

## 2016-01-20 DIAGNOSIS — R29898 Other symptoms and signs involving the musculoskeletal system: Secondary | ICD-10-CM | POA: Diagnosis not present

## 2016-01-20 NOTE — Telephone Encounter (Signed)
Attempted to return call to patient - phone rang, no answer

## 2016-01-20 NOTE — Telephone Encounter (Signed)
New Message: Please call concerning her eye drop. Please call after 1 today.

## 2016-01-21 DIAGNOSIS — R29898 Other symptoms and signs involving the musculoskeletal system: Secondary | ICD-10-CM | POA: Diagnosis not present

## 2016-01-21 DIAGNOSIS — Z9181 History of falling: Secondary | ICD-10-CM | POA: Diagnosis not present

## 2016-01-21 DIAGNOSIS — M6281 Muscle weakness (generalized): Secondary | ICD-10-CM | POA: Diagnosis not present

## 2016-01-24 DIAGNOSIS — M6281 Muscle weakness (generalized): Secondary | ICD-10-CM | POA: Diagnosis not present

## 2016-01-24 DIAGNOSIS — R29898 Other symptoms and signs involving the musculoskeletal system: Secondary | ICD-10-CM | POA: Diagnosis not present

## 2016-01-24 DIAGNOSIS — Z9181 History of falling: Secondary | ICD-10-CM | POA: Diagnosis not present

## 2016-01-27 DIAGNOSIS — R29898 Other symptoms and signs involving the musculoskeletal system: Secondary | ICD-10-CM | POA: Diagnosis not present

## 2016-01-27 DIAGNOSIS — Z9181 History of falling: Secondary | ICD-10-CM | POA: Diagnosis not present

## 2016-01-27 DIAGNOSIS — M6281 Muscle weakness (generalized): Secondary | ICD-10-CM | POA: Diagnosis not present

## 2016-01-28 DIAGNOSIS — M6281 Muscle weakness (generalized): Secondary | ICD-10-CM | POA: Diagnosis not present

## 2016-01-28 DIAGNOSIS — Z9181 History of falling: Secondary | ICD-10-CM | POA: Diagnosis not present

## 2016-01-28 DIAGNOSIS — R29898 Other symptoms and signs involving the musculoskeletal system: Secondary | ICD-10-CM | POA: Diagnosis not present

## 2016-01-29 DIAGNOSIS — M6281 Muscle weakness (generalized): Secondary | ICD-10-CM | POA: Diagnosis not present

## 2016-01-29 DIAGNOSIS — R29898 Other symptoms and signs involving the musculoskeletal system: Secondary | ICD-10-CM | POA: Diagnosis not present

## 2016-01-29 DIAGNOSIS — Z9181 History of falling: Secondary | ICD-10-CM | POA: Diagnosis not present

## 2016-01-30 ENCOUNTER — Encounter: Payer: Medicare Other | Admitting: Nurse Practitioner

## 2016-01-31 ENCOUNTER — Encounter: Payer: Self-pay | Admitting: Nurse Practitioner

## 2016-01-31 ENCOUNTER — Non-Acute Institutional Stay (SKILLED_NURSING_FACILITY): Payer: Medicare Other | Admitting: Nurse Practitioner

## 2016-01-31 DIAGNOSIS — F411 Generalized anxiety disorder: Secondary | ICD-10-CM

## 2016-01-31 DIAGNOSIS — N182 Chronic kidney disease, stage 2 (mild): Secondary | ICD-10-CM | POA: Diagnosis not present

## 2016-01-31 DIAGNOSIS — M6281 Muscle weakness (generalized): Secondary | ICD-10-CM | POA: Diagnosis not present

## 2016-01-31 DIAGNOSIS — Z9181 History of falling: Secondary | ICD-10-CM | POA: Diagnosis not present

## 2016-01-31 DIAGNOSIS — R413 Other amnesia: Secondary | ICD-10-CM

## 2016-01-31 DIAGNOSIS — I10 Essential (primary) hypertension: Secondary | ICD-10-CM

## 2016-01-31 DIAGNOSIS — G308 Other Alzheimer's disease: Secondary | ICD-10-CM | POA: Diagnosis not present

## 2016-01-31 DIAGNOSIS — I471 Supraventricular tachycardia: Secondary | ICD-10-CM | POA: Diagnosis not present

## 2016-01-31 DIAGNOSIS — K5900 Constipation, unspecified: Secondary | ICD-10-CM

## 2016-01-31 DIAGNOSIS — S4991XS Unspecified injury of right shoulder and upper arm, sequela: Secondary | ICD-10-CM | POA: Diagnosis not present

## 2016-01-31 DIAGNOSIS — N318 Other neuromuscular dysfunction of bladder: Secondary | ICD-10-CM | POA: Diagnosis not present

## 2016-01-31 DIAGNOSIS — N189 Chronic kidney disease, unspecified: Secondary | ICD-10-CM | POA: Diagnosis not present

## 2016-01-31 DIAGNOSIS — F0280 Dementia in other diseases classified elsewhere without behavioral disturbance: Secondary | ICD-10-CM | POA: Diagnosis not present

## 2016-01-31 DIAGNOSIS — N3942 Incontinence without sensory awareness: Secondary | ICD-10-CM

## 2016-01-31 DIAGNOSIS — D631 Anemia in chronic kidney disease: Secondary | ICD-10-CM

## 2016-01-31 DIAGNOSIS — R29898 Other symptoms and signs involving the musculoskeletal system: Secondary | ICD-10-CM | POA: Diagnosis not present

## 2016-01-31 NOTE — Assessment & Plan Note (Signed)
Managed with adult brief, not on med 

## 2016-01-31 NOTE — Assessment & Plan Note (Addendum)
Adult brief for incontinent care, off Detrol LA

## 2016-01-31 NOTE — Assessment & Plan Note (Signed)
Heart rate is in control, prn Metoprolol 25mg available  

## 2016-01-31 NOTE — Assessment & Plan Note (Signed)
09/17/15 Bun 25, creat 1.22

## 2016-01-31 NOTE — Assessment & Plan Note (Signed)
Continue sling for support, SNF for care needs, intensive supervision for safety, fall risk

## 2016-01-31 NOTE — Assessment & Plan Note (Signed)
Continue to decline cognitively, taking Aricept, recent fall, the right shoulder injury, in sling for support.

## 2016-01-31 NOTE — Assessment & Plan Note (Signed)
Stable, MOM 30ml qhs.  

## 2016-01-31 NOTE — Assessment & Plan Note (Signed)
09/17/15 Hgb 11.3

## 2016-01-31 NOTE — Assessment & Plan Note (Signed)
Controlled, has prn Metoprolol 25mg available to her for occasional elevated BP/P 

## 2016-01-31 NOTE — Progress Notes (Signed)
Patient ID: Brittany Warren, female   DOB: 1917-03-22, 82 y.o.   MRN: [id number]  Location:  [place] Room Number: 65 Place of Service:  SNF (31) Provider: Lennie Odor Mast NP  Estill Dooms, MD  Patient Care Team: Estill Dooms, MD as PCP - General (Internal Medicine) [place] Minus Breeding, MD as Consulting Physician (Cardiology) R Crecencio Mc, MD as Consulting Physician (Ophthalmology) Leta Baptist, MD as Consulting Physician (Otolaryngology)  Extended Emergency Contact Information Primary Emergency Contact: Vinson Moselle of [place] Phone: [phone] Mobile Phone: [phone] Relation: Friend Secondary Emergency Contact: Daniel,Gloria Address: [place], [place] of [place] Phone: [phone] Mobile Phone: [phone] Relation: Friend  Code Status:  DNR Goals of care: Advanced Directive information Advanced Directives 01/31/2016  Does patient have an advance directive? Yes  Type of Paramedic of [place];Living will;Out of facility DNR (pink MOST or yellow form)  Does patient want to make changes to advanced directive? No - Patient declined  Copy of advanced directive(s) in chart? Yes     Chief Complaint  Patient presents with  . Medical Management of Chronic Issues    Routine Visit    HPI:  Pt is a 82 y.o. female seen today for medical management of chronic diseases.  Hx of dementia, taking Aricept presently. She needs MOM 30mg  qhs and MiraLax for constipation, Lorazepam for anxiety/sleep but still lots of anxiety, poor appetite, weight loss, Metoprolol 25mg  for heart rate as needed, prn Morphine for pain, mainly in the right shoulder injured from falling 12/2014, ED eval and ruled out dislocation or fx.    Past Medical History  Diagnosis Date  . Glaucoma     "both eyes; no vision in left eye as a result" (04/06/2013)  . Anxiety     mild    . PVC (premature ventricular contraction)   . History of recurrent UTIs   . Vision loss of left eye     "can't see at all out of it" (04/06/2013)  . SVT (supraventricular tachycardia) ([place])   . Aortic stenosis     a. Echo 03/2010:  EF 70-75%, mild AS  . Pneumonia 04/06/2013    "for the first time" (04/06/2013)  . Hypertension   . Closed pelvic fracture ([place]) 09/29/12    left nondisplaced inferior pelvic ramus  . Osteoarthritis 08/06/2011  . Anxiety 01/11/2014  . Chronic neck pain 08/06/2011  . Senile osteoporosis 04/08/2011  . Thumb pain 08/06/2011  . Cerebrovascular disease 11/01/2005    Small vessel disease  . Cerebral atrophy 01/10/2015  . Alzheimer's disease 01/10/2015    MMSE 25/30   Past Surgical History  Procedure Laterality Date  . Tonsillectomy    . Appendectomy    . Glaucoma surgery Left     "put in stent for glaucoma" (04/06/2013)  . Cataract extraction, bilateral Bilateral     Allergies  Allergen Reactions  . Codeine Nausea Only      Medication List       This list is accurate as of: 01/31/16 12:09 PM.  Always use your most recent med list.               acetaminophen 325 MG tablet  Commonly known as:  TYLENOL  Take 650 mg by mouth every 4 (four) hours as needed. Take 2 every 6 hours as needed     ACULAR LS 0.4 %  Soln  Generic drug:  ketorolac  Place 1 drop into the left eye 4 (four) times daily as needed (pain).     Calcium Citrate-Vitamin D 315-250 MG-UNIT Tabs  Take by mouth. Take one tablet daily     donepezil 5 MG tablet  Commonly known as:  ARICEPT  One daily to help preserve memory     HYDROcodone-acetaminophen 5-325 MG tablet  Commonly known as:  NORCO/VICODIN  Take 1 tablet by mouth every 12 (twelve) hours as needed for moderate pain.     LORazepam 0.5 MG tablet  Commonly known as:  ATIVAN  Take 0.5 mg by mouth every 6 (six) hours as needed for anxiety.     magnesium hydroxide 400 MG/5ML suspension  Commonly known as:  MILK OF MAGNESIA   Take 30 mLs by mouth at bedtime. for constipation     mirtazapine 7.5 MG tablet  Commonly known as:  REMERON  Take 7.5 mg by mouth at bedtime.     polyethylene glycol packet  Commonly known as:  MIRALAX / GLYCOLAX  Take 17 g by mouth daily as needed for mild constipation.     vitamin C 500 MG tablet  Commonly known as:  ASCORBIC ACID  Take 500 mg by mouth daily.        Review of Systems  Constitutional: Negative for fever, chills and diaphoresis.  HENT: Positive for hearing loss. Negative for congestion, ear discharge, ear pain, nosebleeds and tinnitus.   Eyes: Negative for photophobia, pain and discharge.       Poor vision and glaucoma. Left eye has no vision  Respiratory: Negative.  Negative for cough, shortness of breath and wheezing.   Cardiovascular: Negative for chest pain, palpitations and leg swelling.  Gastrointestinal: Positive for constipation. Negative for nausea, vomiting, abdominal pain, diarrhea and blood in stool.  Genitourinary: Positive for frequency. Negative for dysuria, urgency and hematuria.  Musculoskeletal: Negative for myalgias, back pain and neck pain.       The right shoulder pain with overhead ROM  Skin: Negative.  Negative for rash.  Neurological: Negative.  Negative for dizziness, tremors and weakness.       Forgetful.  Hematological: Does not bruise/bleed easily.  Psychiatric/Behavioral: Negative for suicidal ideas and hallucinations. The patient is nervous/anxious.     Immunization History  Administered Date(s) Administered  . Influenza Split 09/22/2011, 09/13/2012  . Influenza-Unspecified 08/09/2013, 08/27/2014, 08/08/2015  . Tdap 09/13/2012   Pertinent  Health Maintenance Due  Topic Date Due  . DEXA SCAN  03/03/1984  . PNA vac Low Risk Adult (1 of 2 - PCV13) 03/03/1984  . INFLUENZA VACCINE  06/09/2016   Fall Risk  03/21/2015 08/23/2014 09/29/2012 09/29/2012  Falls in the past year? No No No No   Functional Status Survey:    Filed  Vitals:   01/31/16 1012  BP: 123/66  Pulse: 75  Temp: 98.1 F (36.7 C)  TempSrc: Oral  Resp: 18  Height: 5\' 5"  (1.651 m)  Weight: 103 lb 3.2 oz (46.811 kg)   Body mass index is 17.17 kg/(m^2). Physical Exam  Constitutional: She is oriented to person, place, and time. She appears well-developed and well-nourished. No distress.  HENT:  Head: Normocephalic and atraumatic.  Right Ear: External ear normal.  Left Ear: External ear normal.  Eyes: Conjunctivae and EOM are normal. Pupils are equal, round, and reactive to light.  Corrective lenses. Poor vision. Can only see light from left eye.  Neck: Neck supple. No JVD present. No tracheal  deviation present. No thyromegaly present.  Cardiovascular: Normal rate, regular rhythm and intact distal pulses.  Exam reveals no gallop and no friction rub.   Murmur (2/6 aortic ejection murmur) heard. Pulmonary/Chest: Breath sounds normal. No respiratory distress. She has no wheezes. She exhibits no tenderness.  Abdominal: Soft. Bowel sounds are normal. She exhibits no distension. There is no tenderness.  Musculoskeletal: Normal range of motion. She exhibits no edema or tenderness.  Unstable gait. Using walker. The right shoulder pain with overhead ROM, in sling, f/u Ortho  Neurological: She is alert and oriented to person, place, and time. She has normal reflexes. No cranial nerve deficit. Coordination normal.  memory deficit 01/10/15 MMSE 25/30. Passed clock drawing.  Skin: No rash noted. No erythema. No pallor.  Psychiatric: She has a normal mood and affect. Her behavior is normal. Thought content normal.  Lots of anxiety    Labs reviewed:  Recent Labs  09/17/15  NA 141  K 5.0  BUN 25*  CREATININE 1.2*    Recent Labs  09/17/15  AST 17  ALT 15  ALKPHOS 58    Recent Labs  09/17/15  WBC 4.3  HGB 11.3*  HCT 35*  PLT 180   Lab Results  Component Value Date   TSH 1.260 04/06/2013   No results found for: HGBA1C Lab Results    Component Value Date   CHOL 207* 08/28/2014   HDL 80* 08/28/2014   LDLCALC 113 08/28/2014   TRIG 70 08/28/2014    Significant Diagnostic Results in last 30 days:  No results found.  Assessment/Plan  Alzheimer's disease Continue to decline cognitively, taking Aricept, recent fall, the right shoulder injury, in sling for support.   Anemia in chronic kidney disease 09/17/15 Hgb 11.3  Anxiety state Prn Lorazepam available to her. Seems better, continue Mirtazapine 7.5mg  qhs in setting of weight loss.   Chronic kidney disease 09/17/15 Bun 25, creat 1.22  Constipation Stable, MOM 70ml qhs.   Urine incontinence Adult brief for incontinent care, off Detrol LA  SVT (supraventricular tachycardia) Heart rate is in control, prn Metoprolol 25mg  available   Right shoulder injury Continue sling for support, SNF for care needs, intensive supervision for safety, fall risk  Memory deficit Continue to decline cognitively, taking Aricept, recent fall, the right shoulder injury, in sling for support.   Hypertonicity of bladder Managed with adult brief, not on med  HTN (hypertension) Controlled, has prn Metoprolol 25mg  available to her for occasional elevated BP/P    Family/ staff Communication: continue SNF for care needs.   Labs/tests ordered:  none

## 2016-01-31 NOTE — Assessment & Plan Note (Signed)
Prn Lorazepam available to her. Seems better, continue Mirtazapine 7.5mg  qhs in setting of weight loss.

## 2016-02-04 DIAGNOSIS — Z9181 History of falling: Secondary | ICD-10-CM | POA: Diagnosis not present

## 2016-02-04 DIAGNOSIS — R29898 Other symptoms and signs involving the musculoskeletal system: Secondary | ICD-10-CM | POA: Diagnosis not present

## 2016-02-04 DIAGNOSIS — M6281 Muscle weakness (generalized): Secondary | ICD-10-CM | POA: Diagnosis not present

## 2016-02-06 DIAGNOSIS — Z9181 History of falling: Secondary | ICD-10-CM | POA: Diagnosis not present

## 2016-02-06 DIAGNOSIS — M6281 Muscle weakness (generalized): Secondary | ICD-10-CM | POA: Diagnosis not present

## 2016-02-06 DIAGNOSIS — R29898 Other symptoms and signs involving the musculoskeletal system: Secondary | ICD-10-CM | POA: Diagnosis not present

## 2016-02-07 DIAGNOSIS — M6281 Muscle weakness (generalized): Secondary | ICD-10-CM | POA: Diagnosis not present

## 2016-02-07 DIAGNOSIS — Z9181 History of falling: Secondary | ICD-10-CM | POA: Diagnosis not present

## 2016-02-07 DIAGNOSIS — R29898 Other symptoms and signs involving the musculoskeletal system: Secondary | ICD-10-CM | POA: Diagnosis not present

## 2016-02-10 DIAGNOSIS — M6281 Muscle weakness (generalized): Secondary | ICD-10-CM | POA: Diagnosis not present

## 2016-02-10 DIAGNOSIS — Z9181 History of falling: Secondary | ICD-10-CM | POA: Diagnosis not present

## 2016-02-10 DIAGNOSIS — R29898 Other symptoms and signs involving the musculoskeletal system: Secondary | ICD-10-CM | POA: Diagnosis not present

## 2016-03-03 ENCOUNTER — Non-Acute Institutional Stay (SKILLED_NURSING_FACILITY): Payer: Medicare Other | Admitting: Nurse Practitioner

## 2016-03-03 DIAGNOSIS — I471 Supraventricular tachycardia: Secondary | ICD-10-CM | POA: Diagnosis not present

## 2016-03-03 DIAGNOSIS — I10 Essential (primary) hypertension: Secondary | ICD-10-CM | POA: Diagnosis not present

## 2016-03-03 DIAGNOSIS — F0280 Dementia in other diseases classified elsewhere without behavioral disturbance: Secondary | ICD-10-CM

## 2016-03-03 DIAGNOSIS — N182 Chronic kidney disease, stage 2 (mild): Secondary | ICD-10-CM

## 2016-03-03 DIAGNOSIS — N318 Other neuromuscular dysfunction of bladder: Secondary | ICD-10-CM

## 2016-03-03 DIAGNOSIS — R413 Other amnesia: Secondary | ICD-10-CM

## 2016-03-03 DIAGNOSIS — F411 Generalized anxiety disorder: Secondary | ICD-10-CM | POA: Diagnosis not present

## 2016-03-03 DIAGNOSIS — G308 Other Alzheimer's disease: Secondary | ICD-10-CM | POA: Diagnosis not present

## 2016-03-03 DIAGNOSIS — S4991XS Unspecified injury of right shoulder and upper arm, sequela: Secondary | ICD-10-CM | POA: Diagnosis not present

## 2016-03-03 DIAGNOSIS — N189 Chronic kidney disease, unspecified: Secondary | ICD-10-CM

## 2016-03-03 DIAGNOSIS — D631 Anemia in chronic kidney disease: Secondary | ICD-10-CM

## 2016-03-03 DIAGNOSIS — K5900 Constipation, unspecified: Secondary | ICD-10-CM

## 2016-03-03 NOTE — Assessment & Plan Note (Signed)
09/17/15 Bun 25, creat 1.22

## 2016-03-03 NOTE — Assessment & Plan Note (Signed)
Continue to decline cognitively, adjusted to SNF, cotninue Aricept, recent fall, the right shoulder injury, in sling for support.

## 2016-03-03 NOTE — Assessment & Plan Note (Signed)
09/17/15 Hgb 11.3

## 2016-03-03 NOTE — Assessment & Plan Note (Signed)
Managed with adult brief, not on med 

## 2016-03-03 NOTE — Assessment & Plan Note (Signed)
Continue sling for support, SNF for care needs, intensive supervision for safety, fall risk

## 2016-03-03 NOTE — Progress Notes (Signed)
Patient ID: Brittany Warren, female   DOB: [birth year], 82 y.o.   MRN: [id number]  Location:  [facility] Room Number: 1 Place of Service:  SNF (31) Provider: Lennie Odor Aarvi Stotts NP  Estill Dooms, MD  Patient Care Team: Estill Dooms, MD as PCP - General (Internal Medicine) [place] Minus Breeding, MD as Consulting Physician (Cardiology) R Crecencio Mc, MD as Consulting Physician (Ophthalmology) Leta Baptist, MD as Consulting Physician (Otolaryngology)  Extended Emergency Contact Information Primary Emergency Contact: Vinson Moselle of [place] Phone: [phone] Mobile Phone: [phone] Relation: Friend Secondary Emergency Contact: Daniel,Gloria Address: [place], [place] Phone: [phone] Mobile Phone: [phone] Relation: Friend  Code Status:  DNR Goals of care: Advanced Directive information Advanced Directives 02-20-202017  Does patient have an advance directive? Yes  Type of Paramedic of [place];Living will;Out of facility DNR (pink MOST or yellow form)  Does patient want to make changes to advanced directive? No - Patient declined  Copy of advanced directive(s) in chart? Yes     Chief Complaint  Patient presents with  . Medical Management of Chronic Issues    Routine Visit    HPI:  Pt is a 82 y.o. female seen today for medical management of chronic diseases.  Hx of dementia, taking Aricept presently. She needs MOM 30mg  qhs and MiraLax for constipation, Lorazepam for anxiety/sleep but still lots of anxiety, poor appetite, weight loss, Metoprolol 25mg  for heart rate as needed, prn Morphine for pain, mainly in the right shoulder injured from falling 12/2014, ED eval and ruled out dislocation or fx.    Past Medical History  Diagnosis Date  . Glaucoma     "both eyes; no vision in left eye as a result" (04/06/2013)  . Anxiety     mild    . PVC (premature ventricular contraction)   . History of recurrent UTIs   . Vision loss of left eye     "can't see at all out of it" (04/06/2013)  . SVT (supraventricular tachycardia) ([place])   . Aortic stenosis     a. Echo 03/2010:  EF 70-75%, mild AS  . Pneumonia 04/06/2013    "for the first time" (04/06/2013)  . Hypertension   . Closed pelvic fracture ([place]) 09/29/12    left nondisplaced inferior pelvic ramus  . Osteoarthritis 08/06/2011  . Anxiety 01/11/2014  . Chronic neck pain 08/06/2011  . Senile osteoporosis 04/08/2011  . Thumb pain 08/06/2011  . Cerebrovascular disease 11/01/2005    Small vessel disease  . Cerebral atrophy 01/10/2015  . Alzheimer's disease 01/10/2015    MMSE 25/30   Past Surgical History  Procedure Laterality Date  . Tonsillectomy    . Appendectomy    . Glaucoma surgery Left     "put in stent for glaucoma" (04/06/2013)  . Cataract extraction, bilateral Bilateral     Allergies  Allergen Reactions  . Codeine Nausea Only      Medication List       This list is accurate as of: 03/03/16 11:59 PM.  Always use your most recent med list.               acetaminophen 325 MG tablet  Commonly known as:  TYLENOL  Take 650 mg by mouth every 4 (four) hours as needed. Take 2 every 6 hours as needed     ACULAR LS 0.4 %  Soln  Generic drug:  ketorolac  Place 1 drop into the left eye 4 (four) times daily as needed (pain).     Calcium Citrate-Vitamin D 315-250 MG-UNIT Tabs  Take by mouth. Take one tablet daily     donepezil 5 MG tablet  Commonly known as:  ARICEPT  One daily to help preserve memory     HYDROcodone-acetaminophen 5-325 MG tablet  Commonly known as:  NORCO/VICODIN  Take 1 tablet by mouth every 12 (twelve) hours as needed for moderate pain.     LORazepam 0.5 MG tablet  Commonly known as:  ATIVAN  Take 0.5 mg by mouth every 6 (six) hours as needed for anxiety.     magnesium hydroxide 400 MG/5ML suspension  Commonly known as:  MILK OF MAGNESIA   Take 30 mLs by mouth at bedtime. for constipation     mirtazapine 7.5 MG tablet  Commonly known as:  REMERON  Take 7.5 mg by mouth at bedtime.     polyethylene glycol packet  Commonly known as:  MIRALAX / GLYCOLAX  Take 17 g by mouth daily as needed for mild constipation.     UNABLE TO FIND  Take 4 oz by mouth. May have wine 4 oz of wine per night as requested     vitamin C 500 MG tablet  Commonly known as:  ASCORBIC ACID  Take 500 mg by mouth daily.        Review of Systems  Constitutional: Negative for fever, chills and diaphoresis.  HENT: Positive for hearing loss. Negative for congestion, ear discharge, ear pain, nosebleeds and tinnitus.   Eyes: Negative for photophobia, pain and discharge.       Poor vision and glaucoma. Left eye has no vision  Respiratory: Negative.  Negative for cough, shortness of breath and wheezing.   Cardiovascular: Negative for chest pain, palpitations and leg swelling.  Gastrointestinal: Positive for constipation. Negative for nausea, vomiting, abdominal pain, diarrhea and blood in stool.  Genitourinary: Positive for frequency. Negative for dysuria, urgency and hematuria.  Musculoskeletal: Negative for myalgias, back pain and neck pain.       The right shoulder pain with overhead ROM  Skin: Negative.  Negative for rash.  Neurological: Negative.  Negative for dizziness, tremors and weakness.       Forgetful.  Hematological: Does not bruise/bleed easily.  Psychiatric/Behavioral: Negative for suicidal ideas and hallucinations. The patient is nervous/anxious.     Immunization History  Administered Date(s) Administered  . Influenza Split 09/22/2011, 09/13/2012  . Influenza-Unspecified 08/09/2013, 08/27/2014, 08/08/2015  . Tdap 09/13/2012   Pertinent  Health Maintenance Due  Topic Date Due  . DEXA SCAN  03/03/1984  . PNA vac Low Risk Adult (1 of 2 - PCV13) 03/03/1984  . INFLUENZA VACCINE  06/09/2016   Fall Risk  03/21/2015 08/23/2014  09/29/2012 09/29/2012  Falls in the past year? No No No No   Functional Status Survey:    Filed Vitals:   03/05/16 1543  BP: 132/68  Pulse: 80  Temp: 97.6 F (36.4 C)  TempSrc: Oral  Resp: 16  Height: 5\' 5"  (1.651 m)  Weight: 103 lb 12.8 oz (47.083 kg)   Body mass index is 17.27 kg/(m^2). Physical Exam  Constitutional: She is oriented to person, place, and time. She appears well-developed and well-nourished. No distress.  HENT:  Head: Normocephalic and atraumatic.  Right Ear: External ear normal.  Left Ear: External ear normal.  Eyes: Conjunctivae and EOM are normal. Pupils are equal, round, and  reactive to light.  Corrective lenses. Poor vision. Can only see light from left eye.  Neck: Neck supple. No JVD present. No tracheal deviation present. No thyromegaly present.  Cardiovascular: Normal rate, regular rhythm and intact distal pulses.  Exam reveals no gallop and no friction rub.   Murmur (2/6 aortic ejection murmur) heard. Pulmonary/Chest: Breath sounds normal. No respiratory distress. She has no wheezes. She exhibits no tenderness.  Abdominal: Soft. Bowel sounds are normal. She exhibits no distension. There is no tenderness.  Musculoskeletal: Normal range of motion. She exhibits no edema or tenderness.  Unstable gait. Using walker. The right shoulder pain with overhead ROM, in sling, f/u Ortho  Neurological: She is alert and oriented to person, place, and time. She has normal reflexes. No cranial nerve deficit. Coordination normal.  memory deficit 01/10/15 MMSE 25/30. Passed clock drawing.  Skin: No rash noted. No erythema. No pallor.  Psychiatric: She has a normal mood and affect. Her behavior is normal. Thought content normal.  Lots of anxiety    Labs reviewed:  Recent Labs  09/17/15  NA 141  K 5.0  BUN 25*  CREATININE 1.2*    Recent Labs  09/17/15  AST 17  ALT 15  ALKPHOS 58    Recent Labs  09/17/15  WBC 4.3  HGB 11.3*  HCT 35*  PLT 180   Lab  Results  Component Value Date   TSH 1.260 04/06/2013   No results found for: HGBA1C Lab Results  Component Value Date   CHOL 207* 08/28/2014   HDL 80* 08/28/2014   LDLCALC 113 08/28/2014   TRIG 70 08/28/2014    Significant Diagnostic Results in last 30 days:  No results found.  Assessment/Plan  Alzheimer's disease Continue to decline cognitively, adjusted to SNF, cotninue Aricept, recent fall, the right shoulder injury, in sling for support.    Anemia in chronic kidney disease 09/17/15 Hgb 11.3   Anxiety state Prn Lorazepam available to her. Seems better, continue Mirtazapine 7.5mg  qhs in setting of weight loss.   Chronic kidney disease 09/17/15 Bun 25, creat 1.22   Constipation Stable, MOM 69ml qhs.   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    Right shoulder injury Continue sling for support, SNF for care needs, intensive supervision for safety, fall risk   SVT (supraventricular tachycardia) Heart rate is in control, prn Metoprolol 25mg  available      Family/ staff Communication: continue SNF for care needs.   Labs/tests ordered:  none

## 2016-03-03 NOTE — Assessment & Plan Note (Signed)
Controlled, has prn Metoprolol 25mg available to her for occasional elevated BP/P 

## 2016-03-03 NOTE — Assessment & Plan Note (Signed)
Prn Lorazepam available to her. Seems better, continue Mirtazapine 7.5mg  qhs in setting of weight loss.

## 2016-03-03 NOTE — Assessment & Plan Note (Signed)
Heart rate is in control, prn Metoprolol 25mg available  

## 2016-03-03 NOTE — Assessment & Plan Note (Signed)
Continue to decline cognitively, taking Aricept, recent fall, the right shoulder injury, in sling for support.

## 2016-03-03 NOTE — Assessment & Plan Note (Signed)
Stable, MOM 30ml qhs.  

## 2016-03-05 ENCOUNTER — Encounter: Payer: Self-pay | Admitting: Nurse Practitioner

## 2016-03-30 ENCOUNTER — Encounter: Payer: Self-pay | Admitting: Nurse Practitioner

## 2016-03-30 ENCOUNTER — Non-Acute Institutional Stay (SKILLED_NURSING_FACILITY): Payer: Medicare Other | Admitting: Nurse Practitioner

## 2016-03-30 DIAGNOSIS — N189 Chronic kidney disease, unspecified: Secondary | ICD-10-CM

## 2016-03-30 DIAGNOSIS — K5900 Constipation, unspecified: Secondary | ICD-10-CM | POA: Diagnosis not present

## 2016-03-30 DIAGNOSIS — F411 Generalized anxiety disorder: Secondary | ICD-10-CM | POA: Diagnosis not present

## 2016-03-30 DIAGNOSIS — N3942 Incontinence without sensory awareness: Secondary | ICD-10-CM | POA: Diagnosis not present

## 2016-03-30 DIAGNOSIS — S4991XS Unspecified injury of right shoulder and upper arm, sequela: Secondary | ICD-10-CM

## 2016-03-30 DIAGNOSIS — N181 Chronic kidney disease, stage 1: Secondary | ICD-10-CM

## 2016-03-30 DIAGNOSIS — I471 Supraventricular tachycardia, unspecified: Secondary | ICD-10-CM

## 2016-03-30 DIAGNOSIS — G300 Alzheimer's disease with early onset: Secondary | ICD-10-CM | POA: Diagnosis not present

## 2016-03-30 DIAGNOSIS — D631 Anemia in chronic kidney disease: Secondary | ICD-10-CM

## 2016-03-30 DIAGNOSIS — N318 Other neuromuscular dysfunction of bladder: Secondary | ICD-10-CM

## 2016-03-30 DIAGNOSIS — R413 Other amnesia: Secondary | ICD-10-CM

## 2016-03-30 DIAGNOSIS — I10 Essential (primary) hypertension: Secondary | ICD-10-CM | POA: Diagnosis not present

## 2016-03-30 DIAGNOSIS — F0280 Dementia in other diseases classified elsewhere without behavioral disturbance: Secondary | ICD-10-CM

## 2016-03-30 NOTE — Assessment & Plan Note (Signed)
Heart rate is in control, prn Metoprolol 25mg  available

## 2016-03-30 NOTE — Assessment & Plan Note (Signed)
Managed with adult brief, not on med 

## 2016-03-30 NOTE — Assessment & Plan Note (Signed)
Prn Lorazepam available to her. Seems better, continue Mirtazapine 7.5mg  qhs in setting of weight loss.

## 2016-03-30 NOTE — Assessment & Plan Note (Signed)
Adult brief for incontinent care, off Detrol LA

## 2016-03-30 NOTE — Assessment & Plan Note (Signed)
09/17/15 Bun 25, creat 1.22

## 2016-03-30 NOTE — Progress Notes (Signed)
Patient ID: Brittany Warren, female   DOB: 12/28/1916, 82 y.o.   MRN: [id number]  Location:  [facility] Room Number: 93 Place of Service:  SNF (31) Provider: Lennie Odor Javid Kemler NP  Estill Dooms, MD  Patient Care Team: Estill Dooms, MD as PCP - General (Internal Medicine) [place] Minus Breeding, MD as Consulting Physician (Cardiology) R Crecencio Mc, MD as Consulting Physician (Ophthalmology) Leta Baptist, MD as Consulting Physician (Otolaryngology)  Extended Emergency Contact Information Primary Emergency Contact: Vinson Moselle of [place] Phone: [phone] Mobile Phone: [phone] Relation: Friend Secondary Emergency Contact: Daniel,Gloria Address: [place], [place] Phone: [phone] Mobile Phone: [phone] Relation: Friend  Code Status:  DNR Goals of care: Advanced Directive information Advanced Directives 03/30/2016  Does patient have an advance directive? Yes  Type of Paramedic of [place];Out of facility DNR (pink MOST or yellow form)  Does patient want to make changes to advanced directive? No - Patient declined  Copy of advanced directive(s) in chart? Yes     Chief Complaint  Patient presents with  . Medical Management of Chronic Issues    HPI:  Pt is a 82 y.o. female seen today for medical management of chronic diseases.  Hx of dementia, taking Aricept presently. She needs MOM 30mg  qhs and MiraLax for constipation, Lorazepam for anxiety/sleep but still lots of anxiety, poor appetite, weight loss, Metoprolol 25mg  for heart rate as needed, prn Morphine for pain, mainly in the right shoulder injured from falling 12/2014, ED eval and ruled out dislocation or fx.    Past Medical History  Diagnosis Date  . Glaucoma     "both eyes; no vision in left eye as a result" (04/06/2013)  . Anxiety     mild  . PVC (premature ventricular  contraction)   . History of recurrent UTIs   . Vision loss of left eye     "can't see at all out of it" (04/06/2013)  . SVT (supraventricular tachycardia) ([place])   . Aortic stenosis     a. Echo 03/2010:  EF 70-75%, mild AS  . Pneumonia 04/06/2013    "for the first time" (04/06/2013)  . Hypertension   . Closed pelvic fracture ([place]) 09/29/12    left nondisplaced inferior pelvic ramus  . Osteoarthritis 08/06/2011  . Anxiety 01/11/2014  . Chronic neck pain 08/06/2011  . Senile osteoporosis 04/08/2011  . Thumb pain 08/06/2011  . Cerebrovascular disease 11/01/2005    Small vessel disease  . Cerebral atrophy 01/10/2015  . Alzheimer's disease 01/10/2015    MMSE 25/30   Past Surgical History  Procedure Laterality Date  . Tonsillectomy    . Appendectomy    . Glaucoma surgery Left     "put in stent for glaucoma" (04/06/2013)  . Cataract extraction, bilateral Bilateral     Allergies  Allergen Reactions  . Codeine Nausea Only      Medication List       This list is accurate as of: 03/30/16  2:43 PM.  Always use your most recent med list.               acetaminophen 325 MG tablet  Commonly known as:  TYLENOL  Take 650 mg by mouth every 4 (four) hours as needed. Take 2 every 6 hours as needed     ACULAR LS 0.4 % Soln  Generic drug:  ketorolac  Place 1 drop into the left eye 4 (four) times daily as needed (pain).     Calcium Citrate-Vitamin D 315-250 MG-UNIT Tabs  Take by mouth. Take one tablet daily     donepezil 5 MG tablet  Commonly known as:  ARICEPT  One daily to help preserve memory     HYDROcodone-acetaminophen 5-325 MG tablet  Commonly known as:  NORCO/VICODIN  Take 1 tablet by mouth every 12 (twelve) hours as needed for moderate pain.     LORazepam 0.5 MG tablet  Commonly known as:  ATIVAN  Take 0.5 mg by mouth every 6 (six) hours as needed for anxiety.     magnesium hydroxide 400 MG/5ML suspension  Commonly known as:  MILK OF MAGNESIA  Take 30 mLs by mouth at bedtime.  for constipation     mirtazapine 7.5 MG tablet  Commonly known as:  REMERON  Take 7.5 mg by mouth at bedtime.     polyethylene glycol packet  Commonly known as:  MIRALAX / GLYCOLAX  Take 17 g by mouth daily as needed for mild constipation.     UNABLE TO FIND  Take 4 oz by mouth. May have wine 4 oz of wine per night as requested     vitamin C 500 MG tablet  Commonly known as:  ASCORBIC ACID  Take 500 mg by mouth daily.        Review of Systems  Constitutional: Negative for fever, chills and diaphoresis.  HENT: Positive for hearing loss. Negative for congestion, ear discharge, ear pain, nosebleeds and tinnitus.   Eyes: Negative for photophobia, pain and discharge.       Poor vision and glaucoma. Left eye has no vision  Respiratory: Negative.  Negative for cough, shortness of breath and wheezing.   Cardiovascular: Negative for chest pain, palpitations and leg swelling.  Gastrointestinal: Positive for constipation. Negative for nausea, vomiting, abdominal pain, diarrhea and blood in stool.  Genitourinary: Positive for frequency. Negative for dysuria, urgency and hematuria.  Musculoskeletal: Negative for myalgias, back pain and neck pain.       The right shoulder pain with overhead ROM  Skin: Negative.  Negative for rash.  Neurological: Negative.  Negative for dizziness, tremors and weakness.       Forgetful.  Hematological: Does not bruise/bleed easily.  Psychiatric/Behavioral: Negative for suicidal ideas and hallucinations. The patient is nervous/anxious.     Immunization History  Administered Date(s) Administered  . Influenza Split 09/22/2011, 09/13/2012  . Influenza-Unspecified 08/09/2013, 08/27/2014, 08/08/2015  . Tdap 09/13/2012   Pertinent  Health Maintenance Due  Topic Date Due  . DEXA SCAN  03/03/1984  . PNA vac Low Risk Adult (1 of 2 - PCV13) 03/03/1984  . INFLUENZA VACCINE  06/09/2016   Fall Risk  03/21/2015 08/23/2014 09/29/2012 09/29/2012  Falls in the past  year? No No No No   Functional Status Survey:    Filed Vitals:   03/30/16 1207  BP: 154/72  Pulse: 66  Temp: 97.4 F (36.3 C)  TempSrc: Oral  Resp: 18   There is no weight on file to calculate BMI. Physical Exam  Constitutional: She is oriented to person, place, and time. She appears well-developed and well-nourished. No distress.  HENT:  Head: Normocephalic and atraumatic.  Right Ear: External ear normal.  Left Ear: External ear normal.  Eyes: Conjunctivae and EOM are normal. Pupils are equal, round, and reactive to light.  Corrective lenses. Poor vision. Can only see light from left eye.  Neck: Neck supple. No JVD present. No tracheal deviation present. No thyromegaly present.  Cardiovascular: Normal rate, regular rhythm and intact distal pulses.  Exam reveals no gallop and no friction rub.   Murmur (2/6 aortic ejection murmur) heard. Pulmonary/Chest: Breath sounds normal. No respiratory distress. She has no wheezes. She exhibits no tenderness.  Abdominal: Soft. Bowel sounds are normal. She exhibits no distension. There is no tenderness.  Musculoskeletal: Normal range of motion. She exhibits no edema or tenderness.  Unstable gait. Using walker. The right shoulder pain with overhead ROM, in sling, f/u Ortho  Neurological: She is alert and oriented to person, place, and time. She has normal reflexes. No cranial nerve deficit. Coordination normal.  memory deficit 01/10/15 MMSE 25/30. Passed clock drawing.  Skin: No rash noted. No erythema. No pallor.  Psychiatric: She has a normal mood and affect. Her behavior is normal. Thought content normal.  Lots of anxiety    Labs reviewed:  Recent Labs  09/17/15  NA 141  K 5.0  BUN 25*  CREATININE 1.2*    Recent Labs  09/17/15  AST 17  ALT 15  ALKPHOS 58    Recent Labs  09/17/15  WBC 4.3  HGB 11.3*  HCT 35*  PLT 180   Lab Results  Component Value Date   TSH 1.260 04/06/2013   No results found for: HGBA1C Lab  Results  Component Value Date   CHOL 207* 08/28/2014   HDL 80* 08/28/2014   LDLCALC 113 08/28/2014   TRIG 70 08/28/2014    Significant Diagnostic Results in last 30 days:  No results found.  Assessment/Plan  Urine incontinence Adult brief for incontinent care, off Detrol LA   SVT (supraventricular tachycardia) Heart rate is in control, prn Metoprolol 25mg  available     Right shoulder injury Continue sling for support, SNF for care needs, intensive supervision for safety, fall risk    Memory deficit Continue to decline cognitively, taking Aricept, recent fall, the right shoulder injury, in sling for support.     Hypertonicity of bladder Managed with adult brief, not on med   HTN (hypertension) Controlled, has prn Metoprolol 25mg  available to her for occasional elevated BP/P    Constipation Stable, MOM 41ml qhs.    Chronic kidney disease 09/17/15 Bun 25, creat 1.22    Anxiety state Prn Lorazepam available to her. Seems better, continue Mirtazapine 7.5mg  qhs in setting of weight loss.    Anemia in chronic kidney disease 09/17/15 Hgb 11.3   Alzheimer's disease Continue to decline cognitively, adjusted to SNF, cotninue Aricept, recent fall, the right shoulder injury, in sling for support.       Family/ staff Communication: continue SNF for care needs.   Labs/tests ordered:  none

## 2016-03-30 NOTE — Assessment & Plan Note (Signed)
Continue sling for support, SNF for care needs, intensive supervision for safety, fall risk

## 2016-03-30 NOTE — Assessment & Plan Note (Signed)
Controlled, has prn Metoprolol 25mg  available to her for occasional elevated BP/P

## 2016-03-30 NOTE — Assessment & Plan Note (Signed)
Stable, MOM 55ml qhs.

## 2016-03-30 NOTE — Assessment & Plan Note (Signed)
Continue to decline cognitively, taking Aricept, recent fall, the right shoulder injury, in sling for support.

## 2016-03-30 NOTE — Assessment & Plan Note (Signed)
09/17/15 Hgb 11.3

## 2016-03-30 NOTE — Assessment & Plan Note (Signed)
Continue to decline cognitively, adjusted to SNF, cotninue Aricept, recent fall, the right shoulder injury, in sling for support.

## 2016-06-08 ENCOUNTER — Non-Acute Institutional Stay (SKILLED_NURSING_FACILITY): Payer: Medicare Other | Admitting: Nurse Practitioner

## 2016-06-08 ENCOUNTER — Encounter: Payer: Self-pay | Admitting: Nurse Practitioner

## 2016-06-08 DIAGNOSIS — N318 Other neuromuscular dysfunction of bladder: Secondary | ICD-10-CM | POA: Diagnosis not present

## 2016-06-08 DIAGNOSIS — G301 Alzheimer's disease with late onset: Secondary | ICD-10-CM

## 2016-06-08 DIAGNOSIS — F0280 Dementia in other diseases classified elsewhere without behavioral disturbance: Secondary | ICD-10-CM | POA: Diagnosis not present

## 2016-06-08 DIAGNOSIS — K5900 Constipation, unspecified: Secondary | ICD-10-CM | POA: Diagnosis not present

## 2016-06-08 DIAGNOSIS — D631 Anemia in chronic kidney disease: Secondary | ICD-10-CM

## 2016-06-08 DIAGNOSIS — M1610 Unilateral primary osteoarthritis, unspecified hip: Secondary | ICD-10-CM

## 2016-06-08 DIAGNOSIS — I10 Essential (primary) hypertension: Secondary | ICD-10-CM | POA: Diagnosis not present

## 2016-06-08 DIAGNOSIS — F411 Generalized anxiety disorder: Secondary | ICD-10-CM

## 2016-06-08 DIAGNOSIS — N181 Chronic kidney disease, stage 1: Secondary | ICD-10-CM

## 2016-06-08 DIAGNOSIS — N189 Chronic kidney disease, unspecified: Secondary | ICD-10-CM

## 2016-06-08 NOTE — Progress Notes (Signed)
Location:  Index Room Number: 66 Place of Service:    [phone] Provider:  Damondre Pfeifle  NP      Patient Care Team: Estill Dooms, MD as PCP - General (Internal Medicine) Marion Minus Breeding, MD as Consulting Physician (Cardiology) R Crecencio Mc, MD as Consulting Physician (Ophthalmology) Leta Baptist, MD as Consulting Physician (Otolaryngology) Shelaine Frie Otho Darner, NP as Nurse Practitioner (Internal Medicine)  Extended Emergency Contact Information Primary Emergency Contact: McGreggor,Wynn  Johnnette Litter of [place] Phone: [phone] Mobile Phone: [phone] Relation: Friend Secondary Emergency Contact: Daniel,Gloria Address: [place], [place] of [place] Phone: [phone] Mobile Phone: [phone] Relation: Friend  Code Status: DNR Goals of care: Advanced Directive information Advanced Directives 06/08/2016  Does patient have an advance directive? Yes  Type of Advance Directive Out of facility DNR (pink MOST or yellow form);Living will  Does patient want to make changes to advanced directive? No - Patient declined  Copy of advanced directive(s) in chart? Yes  Pre-existing out of facility DNR order (yellow form or pink MOST form) -     Chief Complaint  Patient presents with  . Medical Management of Chronic Issues    HPI:  Pt is a 82 y.o. female seen today for medical management of chronic diseases.      Hx of dementia, taking Aricept presently. She needs MOM 30mg  qhs and MiraLax for constipation, Lorazepam for anxiety/sleep but still lots of anxiety, poor appetite, weight loss, Metoprolol 25mg  for heart rate as needed, prn Morphine for pain, mainly in the right shoulder injured from falling 12/2014, healed with chronic pain and reduced ROM  Past Medical History:  Diagnosis Date  . Alzheimer's disease 01/10/2015   MMSE 25/30  . Anxiety    mild  . Anxiety 01/11/2014  . Aortic  stenosis    a. Echo 03/2010:  EF 70-75%, mild AS  . Cerebral atrophy 01/10/2015  . Cerebrovascular disease 11/01/2005   Small vessel disease  . Chronic neck pain 08/06/2011  . Closed pelvic fracture ([facility]) 09/29/12   left nondisplaced inferior pelvic ramus  . Glaucoma    "both eyes; no vision in left eye as a result" (04/06/2013)  . History of recurrent UTIs   . Hypertension   . Osteoarthritis 08/06/2011  . Pneumonia 04/06/2013   "for the first time" (04/06/2013)  . PVC (premature ventricular contraction)   . Senile osteoporosis 04/08/2011  . SVT (supraventricular tachycardia) ([facility])   . Thumb pain 08/06/2011  . Vision loss of left eye    "can't see at all out of it" (04/06/2013)   Past Surgical History:  Procedure Laterality Date  . APPENDECTOMY    . CATARACT EXTRACTION, BILATERAL Bilateral   . GLAUCOMA SURGERY Left    "put in stent for glaucoma" (04/06/2013)  . TONSILLECTOMY      Allergies  Allergen Reactions  . Codeine Nausea Only      Medication List       Accurate as of 06/08/16 11:59 PM. Always use your most recent med list.          acetaminophen 325 MG tablet Commonly known as:  TYLENOL Take 650 mg by mouth every 4 (four) hours as needed. Take 2 every 6 hours as needed   ACULAR LS 0.4 % Soln Generic drug:  ketorolac Place 1 drop into the left eye 4 (four) times daily as needed (pain).  Calcium Citrate-Vitamin D 315-250 MG-UNIT Tabs Take by mouth. Take one tablet daily   donepezil 5 MG tablet Commonly known as:  ARICEPT One daily to help preserve memory   magnesium hydroxide 400 MG/5ML suspension Commonly known as:  MILK OF MAGNESIA Take 30 mLs by mouth at bedtime. for constipation   mirtazapine 7.5 MG tablet Commonly known as:  REMERON Take 7.5 mg by mouth at bedtime.   polyethylene glycol packet Commonly known as:  MIRALAX / GLYCOLAX Take 17 g by mouth daily as needed for mild constipation.   UNABLE TO FIND Take 4 oz by mouth. May have wine 4 oz of  wine per night as requested   vitamin C 500 MG tablet Commonly known as:  ASCORBIC ACID Take 500 mg by mouth daily.       Review of Systems  Constitutional: Negative for chills, diaphoresis and fever.  HENT: Positive for hearing loss. Negative for congestion, ear discharge, ear pain, nosebleeds and tinnitus.   Eyes: Negative for photophobia, pain and discharge.       Poor vision and glaucoma. Left eye has no vision  Respiratory: Negative.  Negative for cough, shortness of breath and wheezing.   Cardiovascular: Negative for chest pain, palpitations and leg swelling.  Gastrointestinal: Positive for constipation. Negative for abdominal pain, blood in stool, diarrhea, nausea and vomiting.  Genitourinary: Positive for frequency. Negative for dysuria, hematuria and urgency.  Musculoskeletal: Negative for back pain, myalgias and neck pain.       The right shoulder pain with overhead ROM  Skin: Negative.  Negative for rash.  Neurological: Negative.  Negative for dizziness, tremors and weakness.       Forgetful.  Hematological: Does not bruise/bleed easily.  Psychiatric/Behavioral: Negative for hallucinations and suicidal ideas. The patient is nervous/anxious.     Immunization History  Administered Date(s) Administered  . Influenza Split 09/22/2011, 09/13/2012  . Influenza-Unspecified 08/09/2013, 08/27/2014, 08/08/2015  . Tdap 09/13/2012   Pertinent  Health Maintenance Due  Topic Date Due  . DEXA SCAN  03/03/1984  . PNA vac Low Risk Adult (1 of 2 - PCV13) 03/03/1984  . INFLUENZA VACCINE  06/09/2016   Fall Risk  03/21/2015 08/23/2014 09/29/2012 09/29/2012  Falls in the past year? No No No No   Functional Status Survey:    Vitals:   06/08/16 1242  BP: (!) 160/80  Pulse: 72  Resp: 20  Temp: 98.5 F (36.9 C)  Weight: 102 lb 8 oz (46.5 kg)  Height: 5\' 4"  (1.626 m)   Body mass index is 17.59 kg/m. Physical Exam  Constitutional: She is oriented to person, place, and time. She  appears well-developed and well-nourished. No distress.  HENT:  Head: Normocephalic and atraumatic.  Right Ear: External ear normal.  Left Ear: External ear normal.  Eyes: Conjunctivae and EOM are normal. Pupils are equal, round, and reactive to light.  Corrective lenses. Poor vision. Can only see light from left eye.  Neck: Neck supple. No JVD present. No tracheal deviation present. No thyromegaly present.  Cardiovascular: Normal rate, regular rhythm and intact distal pulses.  Exam reveals no gallop and no friction rub.   Murmur (2/6 aortic ejection murmur) heard. Pulmonary/Chest: Breath sounds normal. No respiratory distress. She has no wheezes. She exhibits no tenderness.  Abdominal: Soft. Bowel sounds are normal. She exhibits no distension. There is no tenderness.  Musculoskeletal: Normal range of motion. She exhibits no edema or tenderness.  Unstable gait. Using walker. The right shoulder pain with overhead ROM,  in sling, f/u Ortho  Neurological: She is alert and oriented to person, place, and time. She has normal reflexes. No cranial nerve deficit. Coordination normal.  memory deficit 01/10/15 MMSE 25/30. Passed clock drawing.  Skin: No rash noted. No erythema. No pallor.  Psychiatric: She has a normal mood and affect. Her behavior is normal. Thought content normal.  Lots of anxiety    Labs reviewed:  Recent Labs  09/17/15  NA 141  K 5.0  BUN 25*  CREATININE 1.2*    Recent Labs  09/17/15  AST 17  ALT 15  ALKPHOS 58    Recent Labs  09/17/15  WBC 4.3  HGB 11.3*  HCT 35*  PLT 180   Lab Results  Component Value Date   TSH 1.260 04/06/2013   No results found for: HGBA1C Lab Results  Component Value Date   CHOL 207 (A) 08/28/2014   HDL 80 (A) 08/28/2014   LDLCALC 113 08/28/2014   TRIG 70 08/28/2014    Significant Diagnostic Results in last 30 days:  No results found.  Assessment/Plan There are no diagnoses linked to this encounter.HTN  (hypertension) Controlled, has prn Metoprolol 25mg  available to her for occasional elevated BP/P. Update CBC, CMP, TSH    Constipation Stable, MOM 55ml qhs.   Alzheimer's disease Continue to decline cognitively, adjusted to SNF, cotninue Aricept, recent fall, the right shoulder injury, in sling for support.    Osteoarthritis Continue sling for support, SNF for care needs, intensive supervision for safety, fall risk  Hypertonicity of bladder Managed with adult brief, not on med   Anemia in chronic kidney disease 09/17/15 Hgb 11.3, update CBC  Chronic kidney disease 09/17/15 Bun 25, creat 1.22, update CMP  Anxiety state Prn Lorazepam available to her. Seems better, continue Mirtazapine 7.5mg  qhs in setting of weight loss.        Family/ staff Communication: continue SNF for care assistance  Labs/tests ordered: CBC, CMP, TSH

## 2016-06-09 NOTE — Assessment & Plan Note (Signed)
Stable, MOM 72ml qhs.

## 2016-06-09 NOTE — Assessment & Plan Note (Signed)
Prn Lorazepam available to her. Seems better, continue Mirtazapine 7.5mg  qhs in setting of weight loss.

## 2016-06-09 NOTE — Assessment & Plan Note (Signed)
09/17/15 Bun 25, creat 1.22, update CMP

## 2016-06-09 NOTE — Assessment & Plan Note (Signed)
Managed with adult brief, not on med 

## 2016-06-09 NOTE — Assessment & Plan Note (Signed)
Continue sling for support, SNF for care needs, intensive supervision for safety, fall risk

## 2016-06-09 NOTE — Assessment & Plan Note (Signed)
Controlled, has prn Metoprolol 25mg  available to her for occasional elevated BP/P. Update CBC, CMP, TSH

## 2016-06-09 NOTE — Assessment & Plan Note (Signed)
Continue to decline cognitively, adjusted to SNF, cotninue Aricept, recent fall, the right shoulder injury, in sling for support.

## 2016-06-09 NOTE — Assessment & Plan Note (Signed)
09/17/15 Hgb 11.3, update CBC

## 2016-06-11 DIAGNOSIS — I10 Essential (primary) hypertension: Secondary | ICD-10-CM | POA: Diagnosis not present

## 2016-06-11 DIAGNOSIS — E039 Hypothyroidism, unspecified: Secondary | ICD-10-CM | POA: Diagnosis not present

## 2016-06-11 LAB — HEPATIC FUNCTION PANEL
ALT: 12 U/L (ref 7–35)
AST: 16 U/L (ref 13–35)
Alkaline Phosphatase: 65 U/L (ref 25–125)
Bilirubin, Total: 0.5 mg/dL

## 2016-06-11 LAB — CBC AND DIFFERENTIAL
HCT: 32 % — AB (ref 36–46)
HEMOGLOBIN: 10.7 g/dL — AB (ref 12.0–16.0)
PLATELETS: 171 10*3/uL (ref 150–399)
WBC: 3.2 10*3/mL

## 2016-06-11 LAB — BASIC METABOLIC PANEL
BUN: 23 mg/dL — AB (ref 4–21)
Creatinine: 1.1 mg/dL (ref <=1.1)
Glucose: 84 mg/dL
Potassium: 4.4 mmol/L (ref 3.4–5.3)
Sodium: 142 mmol/L (ref 137–147)

## 2016-06-11 LAB — TSH: TSH: 2.28 u[IU]/mL (ref <=5.90)

## 2016-06-12 ENCOUNTER — Other Ambulatory Visit: Payer: Self-pay | Admitting: *Deleted

## 2016-06-23 DIAGNOSIS — H52221 Regular astigmatism, right eye: Secondary | ICD-10-CM | POA: Diagnosis not present

## 2016-06-23 DIAGNOSIS — H401113 Primary open-angle glaucoma, right eye, severe stage: Secondary | ICD-10-CM | POA: Diagnosis not present

## 2016-06-23 DIAGNOSIS — H18422 Band keratopathy, left eye: Secondary | ICD-10-CM | POA: Diagnosis not present

## 2016-06-23 DIAGNOSIS — H5201 Hypermetropia, right eye: Secondary | ICD-10-CM | POA: Diagnosis not present

## 2016-06-25 ENCOUNTER — Encounter: Payer: Self-pay | Admitting: Internal Medicine

## 2016-06-29 ENCOUNTER — Encounter: Payer: Self-pay | Admitting: Internal Medicine

## 2016-06-29 ENCOUNTER — Non-Acute Institutional Stay (SKILLED_NURSING_FACILITY): Payer: Medicare Other | Admitting: Internal Medicine

## 2016-06-29 DIAGNOSIS — D631 Anemia in chronic kidney disease: Secondary | ICD-10-CM | POA: Diagnosis not present

## 2016-06-29 DIAGNOSIS — I10 Essential (primary) hypertension: Secondary | ICD-10-CM | POA: Diagnosis not present

## 2016-06-29 DIAGNOSIS — N189 Chronic kidney disease, unspecified: Secondary | ICD-10-CM | POA: Diagnosis not present

## 2016-06-29 DIAGNOSIS — G301 Alzheimer's disease with late onset: Secondary | ICD-10-CM | POA: Diagnosis not present

## 2016-06-29 DIAGNOSIS — N181 Chronic kidney disease, stage 1: Secondary | ICD-10-CM | POA: Diagnosis not present

## 2016-06-29 DIAGNOSIS — F0280 Dementia in other diseases classified elsewhere without behavioral disturbance: Secondary | ICD-10-CM

## 2016-06-29 NOTE — Progress Notes (Signed)
Progress Note    Location:   [facility] Room Number: [place] of Service:  SNF [phone]) Provider:  Jeanmarie Hubert, MD  Patient Care Team: Estill Dooms, MD as PCP - General (Internal Medicine) Standard Minus Breeding, MD as Consulting Physician (Cardiology) R Crecencio Mc, MD as Consulting Physician (Ophthalmology) Leta Baptist, MD as Consulting Physician (Otolaryngology) Man Otho Darner, NP as Nurse Practitioner (Internal Medicine)  Extended Emergency Contact Information Primary Emergency Contact: McGreggor,Wynn  Johnnette Litter of [place] Phone: [phone] Mobile Phone: [phone] Relation: Friend Secondary Emergency Contact: Daniel,Gloria Address: [place], [place] of [place] Phone: [phone] Mobile Phone: [phone] Relation: Friend  Code Status:  DNR Goals of care: Advanced Directive information Advanced Directives 06/29/2016  Does patient have an advance directive? Yes  Type of Paramedic of [place];Living will;Out of facility DNR (pink MOST or yellow form)  Does patient want to make changes to advanced directive? -  Copy of advanced directive(s) in chart? Yes  Pre-existing out of facility DNR order (yellow form or pink MOST form) -     Chief Complaint  Patient presents with  . Medical Management of Chronic Issues    routine medication management    HPI:  Brittany Warren is a 82 y.o. female seen today for medical management of chronic diseases.    Late onset Alzheimer's disease without behavioral disturbance - Slow progression of memory loss  Essential hypertension - controlled  Anemia in chronic kidney disease - stable with hemoglobin 10.7  Chronic kidney disease, stage 1  - stable BUN and creatinine     Past Medical History:  Diagnosis Date  . Alzheimer's disease 01/10/2015   MMSE 25/30  . Anxiety    mild  . Anxiety 01/11/2014  . Aortic stenosis    a. Echo 03/2010:  EF 70-75%, mild AS  . Cerebral atrophy 01/10/2015  . Cerebrovascular disease 11/01/2005   Small vessel disease  . Chronic neck pain 08/06/2011  . Closed pelvic fracture ([facility]) 09/29/12   left nondisplaced inferior pelvic ramus  . Glaucoma    "both eyes; no vision in left eye as a result" (04/06/2013)  . History of recurrent UTIs   . Hypertension   . Osteoarthritis 08/06/2011  . Pneumonia 04/06/2013   "for the first time" (04/06/2013)  . PVC (premature ventricular contraction)   . Senile osteoporosis 04/08/2011  . SVT (supraventricular tachycardia) ([facility])   . Thumb pain 08/06/2011  . Vision loss of left eye    "can't see at all out of it" (04/06/2013)   Past Surgical History:  Procedure Laterality Date  . APPENDECTOMY    . CATARACT EXTRACTION, BILATERAL Bilateral   . GLAUCOMA SURGERY Left    "put in stent for glaucoma" (04/06/2013)  . TONSILLECTOMY      Allergies  Allergen Reactions  . Codeine Nausea Only      Medication List       Accurate as of 06/29/16  9:43 AM. Always use your most recent med list.          acetaminophen 325 MG tablet Commonly known as:  TYLENOL Take 650 mg by mouth every 4 (four) hours as needed. Take 2 every 6 hours as needed   ACULAR LS 0.4 % Soln Generic drug:  ketorolac Place 1 drop into the left eye 4 (four) times daily as needed (pain).   Calcium Citrate-Vitamin D 315-250 MG-UNIT  Tabs Take by mouth. Take one tablet daily   donepezil 5 MG tablet Commonly known as:  ARICEPT One daily to help preserve memory   feeding supplement Liqd Take 1 Container by mouth. Drink 4 oz twice daily   magnesium hydroxide 400 MG/5ML suspension Commonly known as:  MILK OF MAGNESIA Take 30 mLs by mouth at bedtime. for constipation   mirtazapine 7.5 MG tablet Commonly known as:  REMERON Take 7.5 mg by mouth at bedtime.   polyethylene glycol packet Commonly known as:  MIRALAX / GLYCOLAX Take 17 g by mouth daily as needed for mild  constipation.   UNABLE TO FIND Take 4 oz by mouth. May have wine 4 oz of wine per night as requested   vitamin C 500 MG tablet Commonly known as:  ASCORBIC ACID Take 500 mg by mouth daily.       Review of Systems  Constitutional: Negative for chills, diaphoresis and fever.  HENT: Positive for hearing loss. Negative for congestion, ear discharge, ear pain, nosebleeds and tinnitus.   Eyes: Negative for photophobia, pain and discharge.       Poor vision and glaucoma. Left eye has no vision  Respiratory: Negative.  Negative for cough, shortness of breath and wheezing.   Cardiovascular: Negative for chest pain, palpitations and leg swelling.  Gastrointestinal: Positive for constipation. Negative for abdominal pain, blood in stool, diarrhea, nausea and vomiting.  Genitourinary: Positive for frequency. Negative for dysuria, hematuria and urgency.  Musculoskeletal: Negative for back pain, myalgias and neck pain.       The right shoulder pain with overhead ROM  Skin: Negative.  Negative for rash.  Neurological: Negative for dizziness, tremors and weakness.       Forgetful.  Hematological: Does not bruise/bleed easily.  Psychiatric/Behavioral: Positive for confusion and decreased concentration. Negative for hallucinations and suicidal ideas. The patient is nervous/anxious.     Immunization History  Administered Date(s) Administered  . Influenza Split 09/22/2011, 09/13/2012  . Influenza-Unspecified 08/09/2013, 08/27/2014, 08/08/2015  . Tdap 09/13/2012   Pertinent  Health Maintenance Due  Topic Date Due  . DEXA SCAN  03/03/1984  . PNA vac Low Risk Adult (1 of 2 - PCV13) 03/03/1984  . INFLUENZA VACCINE  06/09/2016   Fall Risk  03/21/2015 08/23/2014 09/29/2012 09/29/2012  Falls in the past year? No No No No   Functional Status Survey:    Vitals:   06/29/16 0938  BP: 118/68  Pulse: 68  Resp: 20  Temp: 98.4 F (36.9 C)  Weight: 102 lb (46.3 kg)  Height: 5\' 4"  (1.626 m)   Body  mass index is 17.51 kg/m. Physical Exam  Constitutional: She is oriented to person, place, and time. She appears well-developed and well-nourished. No distress.  HENT:  Head: Normocephalic and atraumatic.  Right Ear: External ear normal.  Left Ear: External ear normal.  Eyes: Conjunctivae and EOM are normal. Pupils are equal, round, and reactive to light.  Corrective lenses. Poor vision. Can only see light from left eye.  Neck: Neck supple. No JVD present. No tracheal deviation present. No thyromegaly present.  Cardiovascular: Normal rate, regular rhythm and intact distal pulses.  Exam reveals no gallop and no friction rub.   Murmur (2/6 aortic ejection murmur) heard. Pulmonary/Chest: Breath sounds normal. No respiratory distress. She has no wheezes. She exhibits no tenderness.  Abdominal: Soft. Bowel sounds are normal. She exhibits no distension. There is no tenderness.  Musculoskeletal: Normal range of motion. She exhibits no edema or tenderness.  Unstable gait. Using walker. The right shoulder pain with overhead ROM, in sling, f/u Ortho  Neurological: She is alert and oriented to person, place, and time. She has normal reflexes. No cranial nerve deficit. Coordination normal.  memory deficit 01/10/15 MMSE 25/30. Passed clock drawing.  Skin: No rash noted. No erythema. No pallor.  Psychiatric: She has a normal mood and affect. Her behavior is normal. Thought content normal.  Lots of anxiety    Labs reviewed:  Recent Labs  09/17/15 06/11/16  NA 141 142  K 5.0 4.4  BUN 25* 23*  CREATININE 1.2* 1.1    Recent Labs  09/17/15 06/11/16  AST 17 16  ALT 15 12  ALKPHOS 58 65    Recent Labs  09/17/15 06/11/16  WBC 4.3 3.2  HGB 11.3* 10.7*  HCT 35* 32*  PLT 180 171   Lab Results  Component Value Date   TSH 2.28 06/11/2016   No results found for: HGBA1C Lab Results  Component Value Date   CHOL 207 (A) 08/28/2014   HDL 80 (A) 08/28/2014   LDLCALC 113 08/28/2014   TRIG 70  08/28/2014    Significant Diagnostic Results in last 30 days:  No results found.  Assessment/Plan 1. Late onset Alzheimer's disease without behavioral disturbance Slow progression of memory loss  2. Essential hypertension Controlled  3. Anemia in chronic kidney disease Stable  4. Chronic kidney disease, stage 1 Unchanged

## 2016-07-11 DIAGNOSIS — N390 Urinary tract infection, site not specified: Secondary | ICD-10-CM | POA: Diagnosis not present

## 2016-07-15 ENCOUNTER — Encounter (INDEPENDENT_AMBULATORY_CARE_PROVIDER_SITE_OTHER): Payer: Medicare Other | Admitting: Ophthalmology

## 2016-07-15 DIAGNOSIS — I10 Essential (primary) hypertension: Secondary | ICD-10-CM | POA: Diagnosis not present

## 2016-07-15 DIAGNOSIS — H43813 Vitreous degeneration, bilateral: Secondary | ICD-10-CM | POA: Diagnosis not present

## 2016-07-15 DIAGNOSIS — H353114 Nonexudative age-related macular degeneration, right eye, advanced atrophic with subfoveal involvement: Secondary | ICD-10-CM | POA: Diagnosis not present

## 2016-07-15 DIAGNOSIS — H35033 Hypertensive retinopathy, bilateral: Secondary | ICD-10-CM

## 2016-07-31 ENCOUNTER — Encounter: Payer: Self-pay | Admitting: Nurse Practitioner

## 2016-07-31 ENCOUNTER — Non-Acute Institutional Stay (SKILLED_NURSING_FACILITY): Payer: Medicare Other | Admitting: Nurse Practitioner

## 2016-07-31 DIAGNOSIS — N318 Other neuromuscular dysfunction of bladder: Secondary | ICD-10-CM

## 2016-07-31 DIAGNOSIS — N181 Chronic kidney disease, stage 1: Secondary | ICD-10-CM | POA: Diagnosis not present

## 2016-07-31 DIAGNOSIS — G300 Alzheimer's disease with early onset: Secondary | ICD-10-CM

## 2016-07-31 DIAGNOSIS — I10 Essential (primary) hypertension: Secondary | ICD-10-CM | POA: Diagnosis not present

## 2016-07-31 DIAGNOSIS — F411 Generalized anxiety disorder: Secondary | ICD-10-CM

## 2016-07-31 DIAGNOSIS — M159 Polyosteoarthritis, unspecified: Secondary | ICD-10-CM

## 2016-07-31 DIAGNOSIS — F0280 Dementia in other diseases classified elsewhere without behavioral disturbance: Secondary | ICD-10-CM

## 2016-07-31 DIAGNOSIS — D631 Anemia in chronic kidney disease: Secondary | ICD-10-CM

## 2016-07-31 DIAGNOSIS — K5900 Constipation, unspecified: Secondary | ICD-10-CM

## 2016-07-31 DIAGNOSIS — N189 Chronic kidney disease, unspecified: Secondary | ICD-10-CM

## 2016-07-31 DIAGNOSIS — M150 Primary generalized (osteo)arthritis: Secondary | ICD-10-CM

## 2016-07-31 NOTE — Assessment & Plan Note (Signed)
09/17/15 Bun 25, creat 1.22 06/11/16 Na 142, K 4.4, Bun 23, creat 1.11, Hgb 10.7, TSH 2.28

## 2016-07-31 NOTE — Assessment & Plan Note (Signed)
Controlled, has prn Metoprolol 25mg  available to her for occasional elevated BP/P

## 2016-07-31 NOTE — Assessment & Plan Note (Signed)
06/11/16 Na 142, K 4.4, Bun 23, creat 1.11, Hgb 10.7, TSH 2.28 07/31/16 pharm dc Mirtazapine.

## 2016-07-31 NOTE — Progress Notes (Signed)
Location:  [facility] Room Number: 68 Place of Service:  SNF (31) Provider:  Laddie Naeem, Manxie  NP  Jeanmarie Hubert, MD  Patient Care Team: Estill Dooms, MD as PCP - General (Internal Medicine) [place] Minus Breeding, MD as Consulting Physician (Cardiology) R Crecencio Mc, MD as Consulting Physician (Ophthalmology) Leta Baptist, MD as Consulting Physician (Otolaryngology) Athel Merriweather Otho Darner, NP as Nurse Practitioner (Internal Medicine)  Extended Emergency Contact Information Primary Emergency Contact: McGreggor,Wynn  Johnnette Litter of [place] Phone: [phone] Mobile Phone: [phone] Relation: Friend Secondary Emergency Contact: Daniel,Gloria Address: [place], [place] Phone: [phone] Mobile Phone: [phone] Relation: Friend  Code Status:  DNR Goals of care: Advanced Directive information Advanced Directives 07/31/2016  Does patient have an advance directive? Yes  Type of Advance Directive Living will;Out of facility DNR (pink MOST or yellow form);Healthcare Power of Attorney  Does patient want to make changes to advanced directive? No - Patient declined  Copy of advanced directive(s) in chart? Yes  Pre-existing out of facility DNR order (yellow form or pink MOST form) -     Chief Complaint  Patient presents with  . Medical Management of Chronic Issues    HPI:  Pt is a 82 y.o. female seen today for medical management of chronic diseases.    Hx of dementia, taking Aricept presently. She needs MOM 30mg  qhs and MiraLax for constipation, Lorazepam for anxiety/sleep but still lots of anxiety, poor appetite, weight loss, stabilized since Mirtazapine 7.5mg  hs. Metoprolol 25mg  for heart rate as needed, prn Morphine for pain, mainly in the right shoulder injured from falling 12/2014, healed with chronic pain and reduced ROM  Past Medical History:  Diagnosis Date  . Alzheimer's disease  01/10/2015   MMSE 25/30  . Anxiety    mild  . Anxiety 01/11/2014  . Aortic stenosis    a. Echo 03/2010:  EF 70-75%, mild AS  . Cerebral atrophy 01/10/2015  . Cerebrovascular disease 11/01/2005   Small vessel disease  . Chronic neck pain 08/06/2011  . Closed pelvic fracture ([place]) 09/29/12   left nondisplaced inferior pelvic ramus  . Glaucoma    "both eyes; no vision in left eye as a result" (04/06/2013)  . History of recurrent UTIs   . Hypertension   . Osteoarthritis 08/06/2011  . Pneumonia 04/06/2013   "for the first time" (04/06/2013)  . PVC (premature ventricular contraction)   . Senile osteoporosis 04/08/2011  . SVT (supraventricular tachycardia) ([place])   . Thumb pain 08/06/2011  . Vision loss of left eye    "can't see at all out of it" (04/06/2013)   Past Surgical History:  Procedure Laterality Date  . APPENDECTOMY    . CATARACT EXTRACTION, BILATERAL Bilateral   . GLAUCOMA SURGERY Left    "put in stent for glaucoma" (04/06/2013)  . TONSILLECTOMY      Allergies  Allergen Reactions  . Codeine Nausea Only      Medication List       Accurate as of 07/31/16  2:16 PM. Always use your most recent med list.          acetaminophen 325 MG tablet Commonly known as:  TYLENOL Take 650 mg by mouth every 4 (four) hours as needed. Take 2 every 6 hours as needed   ACULAR LS 0.4 % Soln Generic drug:  ketorolac Place 1 drop into the left eye 4 (four) times  daily as needed (pain).   Calcium Citrate-Vitamin D 315-250 MG-UNIT Tabs Take by mouth. Take one tablet daily   donepezil 5 MG tablet Commonly known as:  ARICEPT One daily to help preserve memory   feeding supplement Liqd Take 1 Container by mouth. Drink 4 oz twice daily   magnesium hydroxide 400 MG/5ML suspension Commonly known as:  MILK OF MAGNESIA Take 30 mLs by mouth at bedtime. for constipation   mirtazapine 7.5 MG tablet Commonly known as:  REMERON Take 7.5 mg by mouth at bedtime.   polyethylene glycol  packet Commonly known as:  MIRALAX / GLYCOLAX Take 17 g by mouth daily as needed for mild constipation.   UNABLE TO FIND Take 4 oz by mouth. May have wine 4 oz of wine per night as requested   vitamin C 500 MG tablet Commonly known as:  ASCORBIC ACID Take 500 mg by mouth daily.       Review of Systems  Constitutional: Negative for chills, diaphoresis and fever.  HENT: Positive for hearing loss. Negative for congestion, ear discharge, ear pain, nosebleeds and tinnitus.   Eyes: Negative for photophobia, pain and discharge.       Poor vision and glaucoma. Left eye has no vision  Respiratory: Negative.  Negative for cough, shortness of breath and wheezing.   Cardiovascular: Negative for chest pain, palpitations and leg swelling.  Gastrointestinal: Positive for constipation. Negative for abdominal pain, blood in stool, diarrhea, nausea and vomiting.  Genitourinary: Positive for frequency. Negative for dysuria, hematuria and urgency.  Musculoskeletal: Negative for back pain, myalgias and neck pain.       The right shoulder pain with overhead ROM  Skin: Negative.  Negative for rash.  Neurological: Negative for dizziness, tremors and weakness.       Forgetful.  Hematological: Does not bruise/bleed easily.  Psychiatric/Behavioral: Positive for confusion and decreased concentration. Negative for hallucinations and suicidal ideas. The patient is nervous/anxious.     Immunization History  Administered Date(s) Administered  . Influenza Split 09/22/2011, 09/13/2012  . Influenza-Unspecified 08/09/2013, 08/27/2014, 08/08/2015  . Tdap 09/13/2012   Pertinent  Health Maintenance Due  Topic Date Due  . DEXA SCAN  03/03/1984  . PNA vac Low Risk Adult (1 of 2 - PCV13) 03/03/1984  . INFLUENZA VACCINE  06/09/2016   Fall Risk  03/21/2015 08/23/2014 09/29/2012 09/29/2012  Falls in the past year? No No No No   Functional Status Survey:    Vitals:   07/31/16 1308  BP: 120/62  Pulse: 80   Resp: 18  Temp: 97.4 F (36.3 C)  Weight: 104 lb 11.2 oz (47.5 kg)  Height: 5\' 4"  (1.626 m)   Body mass index is 17.97 kg/m. Physical Exam  Constitutional: She is oriented to person, place, and time. She appears well-developed and well-nourished. No distress.  HENT:  Head: Normocephalic and atraumatic.  Right Ear: External ear normal.  Left Ear: External ear normal.  Eyes: Conjunctivae and EOM are normal. Pupils are equal, round, and reactive to light.  Corrective lenses. Poor vision. Can only see light from left eye.  Neck: Neck supple. No JVD present. No tracheal deviation present. No thyromegaly present.  Cardiovascular: Normal rate, regular rhythm and intact distal pulses.  Exam reveals no gallop and no friction rub.   Murmur (2/6 aortic ejection murmur) heard. Pulmonary/Chest: Breath sounds normal. No respiratory distress. She has no wheezes. She exhibits no tenderness.  Abdominal: Soft. Bowel sounds are normal. She exhibits no distension. There is no tenderness.  Musculoskeletal: Normal range of motion. She exhibits no edema or tenderness.  Unstable gait. Using walker. The right shoulder pain with overhead ROM, in sling, f/u Ortho  Neurological: She is alert and oriented to person, place, and time. She has normal reflexes. No cranial nerve deficit. Coordination normal.  memory deficit 01/10/15 MMSE 25/30. Passed clock drawing.  Skin: No rash noted. No erythema. No pallor.  Psychiatric: She has a normal mood and affect. Her behavior is normal. Thought content normal.  Lots of anxiety    Labs reviewed:  Recent Labs  09/17/15 06/11/16  NA 141 142  K 5.0 4.4  BUN 25* 23*  CREATININE 1.2* 1.1    Recent Labs  09/17/15 06/11/16  AST 17 16  ALT 15 12  ALKPHOS 58 65    Recent Labs  09/17/15 06/11/16  WBC 4.3 3.2  HGB 11.3* 10.7*  HCT 35* 32*  PLT 180 171   Lab Results  Component Value Date   TSH 2.28 06/11/2016   No results found for: HGBA1C Lab Results   Component Value Date   CHOL 207 (A) 08/28/2014   HDL 80 (A) 08/28/2014   LDLCALC 113 08/28/2014   TRIG 70 08/28/2014    Significant Diagnostic Results in last 30 days:  No results found.  Assessment/Plan There are no diagnoses linked to this encounter.Anxiety state 06/11/16 Na 142, K 4.4, Bun 23, creat 1.11, Hgb 10.7, TSH 2.28 07/31/16 pharm dc Mirtazapine.     HTN (hypertension) Controlled, has prn Metoprolol 25mg  available to her for occasional elevated BP/P  Constipation Stable, MOM 14ml qhs.   Alzheimer's disease Continue to decline cognitively, adjusted to SNF, cotninue Aricept, recent fall, the right shoulder injury, in sling for support.     Osteoarthritis Continue sling for support, SNF for care needs, intensive supervision for safety, fall risk  Anemia in chronic kidney disease 09/17/15 Hgb 11.3 06/11/16 Na 142, K 4.4, Bun 23, creat 1.11, Hgb 10.7, TSH 2.28  Chronic kidney disease 09/17/15 Bun 25, creat 1.22 06/11/16 Na 142, K 4.4, Bun 23, creat 1.11, Hgb 10.7, TSH 2.28    Hypertonicity of bladder Managed with adult brief, not on med     Family/ staff Communication: continue SNF for care assistance.   Labs/tests ordered:  none

## 2016-07-31 NOTE — Assessment & Plan Note (Signed)
Managed with adult brief, not on med 

## 2016-07-31 NOTE — Assessment & Plan Note (Signed)
Stable, MOM 21ml qhs.

## 2016-07-31 NOTE — Assessment & Plan Note (Signed)
Continue to decline cognitively, adjusted to SNF, cotninue Aricept, recent fall, the right shoulder injury, in sling for support.

## 2016-07-31 NOTE — Assessment & Plan Note (Signed)
09/17/15 Hgb 11.3 06/11/16 Na 142, K 4.4, Bun 23, creat 1.11, Hgb 10.7, TSH 2.28

## 2016-07-31 NOTE — Assessment & Plan Note (Signed)
Continue sling for support, SNF for care needs, intensive supervision for safety, fall risk

## 2016-08-27 ENCOUNTER — Non-Acute Institutional Stay (SKILLED_NURSING_FACILITY): Payer: Medicare Other | Admitting: Nurse Practitioner

## 2016-08-27 ENCOUNTER — Encounter: Payer: Self-pay | Admitting: Nurse Practitioner

## 2016-08-27 DIAGNOSIS — F0280 Dementia in other diseases classified elsewhere without behavioral disturbance: Secondary | ICD-10-CM

## 2016-08-27 DIAGNOSIS — K5900 Constipation, unspecified: Secondary | ICD-10-CM

## 2016-08-27 DIAGNOSIS — G308 Other Alzheimer's disease: Secondary | ICD-10-CM

## 2016-08-27 DIAGNOSIS — M150 Primary generalized (osteo)arthritis: Secondary | ICD-10-CM | POA: Diagnosis not present

## 2016-08-27 DIAGNOSIS — F411 Generalized anxiety disorder: Secondary | ICD-10-CM | POA: Diagnosis not present

## 2016-08-27 DIAGNOSIS — D631 Anemia in chronic kidney disease: Secondary | ICD-10-CM | POA: Diagnosis not present

## 2016-08-27 DIAGNOSIS — M159 Polyosteoarthritis, unspecified: Secondary | ICD-10-CM

## 2016-08-27 DIAGNOSIS — I10 Essential (primary) hypertension: Secondary | ICD-10-CM | POA: Diagnosis not present

## 2016-08-27 DIAGNOSIS — N181 Chronic kidney disease, stage 1: Secondary | ICD-10-CM | POA: Diagnosis not present

## 2016-08-27 DIAGNOSIS — N318 Other neuromuscular dysfunction of bladder: Secondary | ICD-10-CM | POA: Diagnosis not present

## 2016-08-27 NOTE — Assessment & Plan Note (Signed)
09/17/15 Bun 25, creat 1.22 06/11/16 Na 142, K 4.4, Bun 23, creat 1.11, Hgb 10.7, TSH 2.28

## 2016-08-27 NOTE — Assessment & Plan Note (Signed)
09/17/15 Hgb 11.3 06/11/16 Na 142, K 4.4, Bun 23, creat 1.11, Hgb 10.7, TSH 2.28

## 2016-08-27 NOTE — Assessment & Plan Note (Signed)
Controlled, has prn Metoprolol 25mg  available to her for occasional elevated BP/P

## 2016-08-27 NOTE — Assessment & Plan Note (Signed)
Stable, MOM 68ml qhs.

## 2016-08-27 NOTE — Assessment & Plan Note (Signed)
Continue SNF for care needs, intensive supervision for safety, fall risk

## 2016-08-27 NOTE — Assessment & Plan Note (Signed)
06/11/16 Na 142, K 4.4, Bun 23, creat 1.11, Hgb 10.7, TSH 2.28 07/31/16 pharm dc Mirtazapine.

## 2016-08-27 NOTE — Assessment & Plan Note (Addendum)
Continue to decline cognitively, adjusted to SNF, cotninue Aricept. 06/11/16 Na 142, K 4.4, Bun 23, creat 1.11, Hgb 10.7, TSH 2.28

## 2016-08-27 NOTE — Assessment & Plan Note (Signed)
Managed with adult brief, not on med 

## 2016-08-27 NOTE — Progress Notes (Signed)
Location:  [facility] Room Number: 60 Place of Service:  SNF (31) Provider: Mast, Manxie  NP  Jeanmarie Hubert, MD  Patient Care Team: Estill Dooms, MD as PCP - General (Internal Medicine) [place] Minus Breeding, MD as Consulting Physician (Cardiology) R Crecencio Mc, MD as Consulting Physician (Ophthalmology) Leta Baptist, MD as Consulting Physician (Otolaryngology) Man Otho Darner, NP as Nurse Practitioner (Internal Medicine)  Extended Emergency Contact Information Primary Emergency Contact: McGreggor,Wynn  Johnnette Litter of [place] Phone: [phone] Mobile Phone: [phone] Relation: Friend Secondary Emergency Contact: Daniel,Gloria Address: [place], [place] of [place] Phone: [phone] Mobile Phone: [phone] Relation: Friend  Code Status:  DNR Goals of care: Advanced Directive information Advanced Directives 08/27/2016  Does patient have an advance directive? Yes  Type of Advance Directive Living will;Healthcare Power of [place];Out of facility DNR (pink MOST or yellow form)  Does patient want to make changes to advanced directive? No - Patient declined  Copy of advanced directive(s) in chart? Yes  Pre-existing out of facility DNR order (yellow form or pink MOST form) -     Chief Complaint  Patient presents with  . Medical Management of Chronic Issues    HPI:  Pt is a 82 y.o. female seen today for medical management of chronic diseases.    Hx of dementia, taking Aricept presently. She needs MOM 30mg  qhs and MiraLax for constipation, Lorazepam for anxiety/sleep,  Stabilized, off Mirtazapine 7.5mg  hs since 07/31/16. Metoprolol 25mg  for heart rate as needed, arthritic pain, mainly in the right shoulder injured from falling 12/2014, healed with reduced ROM    Past Medical History:  Diagnosis Date  . Alzheimer's disease 01/10/2015   MMSE 25/30  . Anxiety    mild  . Anxiety  01/11/2014  . Aortic stenosis    a. Echo 03/2010:  EF 70-75%, mild AS  . Cerebral atrophy 01/10/2015  . Cerebrovascular disease 11/01/2005   Small vessel disease  . Chronic neck pain 08/06/2011  . Closed pelvic fracture ([place]) 09/29/12   left nondisplaced inferior pelvic ramus  . Glaucoma    "both eyes; no vision in left eye as a result" (04/06/2013)  . History of recurrent UTIs   . Hypertension   . Osteoarthritis 08/06/2011  . Pneumonia 04/06/2013   "for the first time" (04/06/2013)  . PVC (premature ventricular contraction)   . Senile osteoporosis 04/08/2011  . SVT (supraventricular tachycardia) ([place])   . Thumb pain 08/06/2011  . Vision loss of left eye    "can't see at all out of it" (04/06/2013)   Past Surgical History:  Procedure Laterality Date  . APPENDECTOMY    . CATARACT EXTRACTION, BILATERAL Bilateral   . GLAUCOMA SURGERY Left    "put in stent for glaucoma" (04/06/2013)  . TONSILLECTOMY      Allergies  Allergen Reactions  . Codeine Nausea Only      Medication List       Accurate as of 08/27/16  1:10 PM. Always use your most recent med list.          acetaminophen 325 MG tablet Commonly known as:  TYLENOL Take 650 mg by mouth every 4 (four) hours as needed. Take 2 every 6 hours as needed   ACULAR LS 0.4 % Soln Generic drug:  ketorolac Place 1 drop into the left eye 4 (four) times daily as needed (pain).   Calcium Citrate-Vitamin D 315-250  MG-UNIT Tabs Take by mouth. Take one tablet daily   donepezil 5 MG tablet Commonly known as:  ARICEPT One daily to help preserve memory   feeding supplement Liqd Take 1 Container by mouth. Drink 4 oz twice daily   magnesium hydroxide 400 MG/5ML suspension Commonly known as:  MILK OF MAGNESIA Take 30 mLs by mouth at bedtime. for constipation   polyethylene glycol packet Commonly known as:  MIRALAX / GLYCOLAX Take 17 g by mouth daily as needed for mild constipation.   UNABLE TO FIND Take 4 oz by mouth. May have wine 4  oz of wine per night as requested   vitamin C 500 MG tablet Commonly known as:  ASCORBIC ACID Take 500 mg by mouth daily.       Review of Systems  Constitutional: Negative for chills, diaphoresis and fever.  HENT: Positive for hearing loss. Negative for congestion, ear discharge, ear pain, nosebleeds and tinnitus.   Eyes: Negative for photophobia, pain and discharge.       Poor vision and glaucoma. Left eye has no vision  Respiratory: Negative.  Negative for cough, shortness of breath and wheezing.   Cardiovascular: Negative for chest pain, palpitations and leg swelling.  Gastrointestinal: Positive for constipation. Negative for abdominal pain, blood in stool, diarrhea, nausea and vomiting.  Genitourinary: Positive for frequency. Negative for dysuria, hematuria and urgency.  Musculoskeletal: Negative for back pain, myalgias and neck pain.       The right shoulder with reduced overhead ROM  Skin: Negative.  Negative for rash.  Neurological: Negative for dizziness, tremors and weakness.       Forgetful.  Hematological: Does not bruise/bleed easily.  Psychiatric/Behavioral: Positive for confusion and decreased concentration. Negative for hallucinations and suicidal ideas. The patient is nervous/anxious.     Immunization History  Administered Date(s) Administered  . Influenza Split 09/22/2011, 09/13/2012  . Influenza-Unspecified 08/09/2013, 08/27/2014, 08/08/2015  . Tdap 09/13/2012   Pertinent  Health Maintenance Due  Topic Date Due  . DEXA SCAN  03/03/1984  . PNA vac Low Risk Adult (1 of 2 - PCV13) 03/03/1984  . INFLUENZA VACCINE  06/09/2016   Fall Risk  03/21/2015 08/23/2014 09/29/2012 09/29/2012  Falls in the past year? No No No No   Functional Status Survey:    Vitals:   08/27/16 1116  BP: 120/62  Pulse: 80  Resp: 18  Temp: 97.4 F (36.3 C)  Weight: 105 lb (47.6 kg)  Height: 5\' 4"  (1.626 m)   Body mass index is 18.02 kg/m. Physical Exam  Constitutional: She is  oriented to person, place, and time. She appears well-developed and well-nourished. No distress.  HENT:  Head: Normocephalic and atraumatic.  Right Ear: External ear normal.  Left Ear: External ear normal.  Eyes: Conjunctivae and EOM are normal. Pupils are equal, round, and reactive to light.  Corrective lenses. Poor vision. Can only see light from left eye.  Neck: Neck supple. No JVD present. No tracheal deviation present. No thyromegaly present.  Cardiovascular: Normal rate, regular rhythm and intact distal pulses.  Exam reveals no gallop and no friction rub.   Murmur (2/6 aortic ejection murmur) heard. Pulmonary/Chest: Breath sounds normal. No respiratory distress. She has no wheezes. She exhibits no tenderness.  Abdominal: Soft. Bowel sounds are normal. She exhibits no distension. There is no tenderness.  Musculoskeletal: She exhibits no edema or tenderness.  Unstable gait. Using walker. The right shoulder reduced overhead ROM  Neurological: She is alert and oriented to person, place, and  time. She has normal reflexes. No cranial nerve deficit. Coordination normal.  memory deficit 01/10/15 MMSE 25/30. Passed clock drawing.  Skin: No rash noted. No erythema. No pallor.  Psychiatric: She has a normal mood and affect. Her behavior is normal. Thought content normal.  Lots of anxiety    Labs reviewed:  Recent Labs  09/17/15 06/11/16  NA 141 142  K 5.0 4.4  BUN 25* 23*  CREATININE 1.2* 1.1    Recent Labs  09/17/15 06/11/16  AST 17 16  ALT 15 12  ALKPHOS 58 65    Recent Labs  09/17/15 06/11/16  WBC 4.3 3.2  HGB 11.3* 10.7*  HCT 35* 32*  PLT 180 171   Lab Results  Component Value Date   TSH 2.28 06/11/2016   No results found for: HGBA1C Lab Results  Component Value Date   CHOL 207 (A) 08/28/2014   HDL 80 (A) 08/28/2014   LDLCALC 113 08/28/2014   TRIG 70 08/28/2014    Significant Diagnostic Results in last 30 days:  No results found.  Assessment/Plan No  problem-specific Assessment & Plan notes found for this encounter.     Family/ staff Communication:continue SNF for care assistance  Labs/tests ordered:  none

## 2016-09-18 ENCOUNTER — Encounter: Payer: Self-pay | Admitting: Internal Medicine

## 2016-09-18 ENCOUNTER — Non-Acute Institutional Stay (SKILLED_NURSING_FACILITY): Payer: Medicare Other | Admitting: Internal Medicine

## 2016-09-18 DIAGNOSIS — F0280 Dementia in other diseases classified elsewhere without behavioral disturbance: Secondary | ICD-10-CM | POA: Diagnosis not present

## 2016-09-18 DIAGNOSIS — D631 Anemia in chronic kidney disease: Secondary | ICD-10-CM | POA: Diagnosis not present

## 2016-09-18 DIAGNOSIS — I10 Essential (primary) hypertension: Secondary | ICD-10-CM | POA: Diagnosis not present

## 2016-09-18 DIAGNOSIS — G308 Other Alzheimer's disease: Secondary | ICD-10-CM

## 2016-09-18 DIAGNOSIS — N181 Chronic kidney disease, stage 1: Secondary | ICD-10-CM

## 2016-09-18 NOTE — Progress Notes (Signed)
Progress Note    Location:  [facility] Room Number: [place] of Service:  SNF [phone]) Provider:  Jeanmarie Hubert, MD  Patient Care Team: Estill Dooms, MD as PCP - General (Internal Medicine) [place] Minus Breeding, MD as Consulting Physician (Cardiology) R Crecencio Mc, MD as Consulting Physician (Ophthalmology) Leta Baptist, MD as Consulting Physician (Otolaryngology) Man Otho Darner, NP as Nurse Practitioner (Internal Medicine)  Extended Emergency Contact Information Primary Emergency Contact: [place] of [place] Phone: [phone] Mobile Phone: [phone] Relation: Friend Secondary Emergency Contact: Daniel,Gloria Address: [place], [place] of [place] Phone: [phone] Mobile Phone: [phone] Relation: Friend  Code Status:  DNR Goals of care: Advanced Directive information Advanced Directives 09/18/2016  Does patient have an advance directive? Yes  Type of Paramedic of [place];Living will  Does patient want to make changes to advanced directive? -  Copy of advanced directive(s) in chart? Yes  Pre-existing out of facility DNR order (yellow form or pink MOST form) Yellow form placed in chart (order not valid for inpatient use)     Chief Complaint  Patient presents with  . Medical Management of Chronic Issues    routine visit    HPI:  Pt is a 82 y.o. female seen today for medical management of chronic diseases.    Alzheimer's disease of other onset without behavioral disturbance - unchanged  Essential hypertension - controlled  Anemia in stage 1 chronic kidney disease - chronic and unchanged   Stage 1 chronic kidney disease - stable     Past Medical History:  Diagnosis Date  . Alzheimer's disease 01/10/2015   MMSE 25/30  . Anxiety    mild  . Anxiety 01/11/2014  . Aortic stenosis    a. Echo 03/2010:  EF 70-75%, mild AS    . Cerebral atrophy 01/10/2015  . Cerebrovascular disease 11/01/2005   Small vessel disease  . Chronic neck pain 08/06/2011  . Closed pelvic fracture ([place]) 09/29/12   left nondisplaced inferior pelvic ramus  . Glaucoma    "both eyes; no vision in left eye as a result" (04/06/2013)  . History of recurrent UTIs   . Hypertension   . Osteoarthritis 08/06/2011  . Pneumonia 04/06/2013   "for the first time" (04/06/2013)  . PVC (premature ventricular contraction)   . Senile osteoporosis 04/08/2011  . SVT (supraventricular tachycardia) ([place])   . Thumb pain 08/06/2011  . Vision loss of left eye    "can't see at all out of it" (04/06/2013)   Past Surgical History:  Procedure Laterality Date  . APPENDECTOMY    . CATARACT EXTRACTION, BILATERAL Bilateral   . GLAUCOMA SURGERY Left    "put in stent for glaucoma" (04/06/2013)  . TONSILLECTOMY      Allergies  Allergen Reactions  . Codeine Nausea Only      Medication List       Accurate as of 09/18/16  2:37 PM. Always use your most recent med list.          acetaminophen 325 MG tablet Commonly known as:  TYLENOL Take 650 mg by mouth every 4 (four) hours as needed. Take 2 every 6 hours as needed   ACULAR LS 0.4 % Soln Generic drug:  ketorolac Place 1 drop into the left eye 4 (four) times daily as needed (pain).   Calcium Citrate-Vitamin D 315-250 MG-UNIT Tabs Take by  mouth. Take one tablet daily   donepezil 5 MG tablet Commonly known as:  ARICEPT One daily to help preserve memory   feeding supplement Liqd Take 1 Container by mouth. Drink 4 oz twice daily   magnesium hydroxide 400 MG/5ML suspension Commonly known as:  MILK OF MAGNESIA Take 30 mLs by mouth at bedtime. for constipation   polyethylene glycol packet Commonly known as:  MIRALAX / GLYCOLAX Take 17 g by mouth daily as needed for mild constipation.   vitamin C 500 MG tablet Commonly known as:  ASCORBIC ACID Take 500 mg by mouth daily.       Review of Systems   Constitutional: Negative for chills, diaphoresis and fever.  HENT: Positive for hearing loss. Negative for congestion, ear discharge, ear pain, nosebleeds and tinnitus.   Eyes: Negative for photophobia, pain and discharge.       Poor vision and glaucoma. Left eye has no vision  Respiratory: Negative.  Negative for cough, shortness of breath and wheezing.   Cardiovascular: Negative for chest pain, palpitations and leg swelling.  Gastrointestinal: Positive for constipation. Negative for abdominal pain, blood in stool, diarrhea, nausea and vomiting.  Genitourinary: Positive for frequency. Negative for dysuria, hematuria and urgency.  Musculoskeletal: Negative for back pain, myalgias and neck pain.       The right shoulder pain with overhead ROM  Skin: Negative.  Negative for rash.  Neurological: Negative for dizziness, tremors and weakness.       Forgetful.  Hematological: Does not bruise/bleed easily.  Psychiatric/Behavioral: Positive for confusion and decreased concentration. Negative for hallucinations and suicidal ideas. The patient is nervous/anxious.     Immunization History  Administered Date(s) Administered  . Influenza Split 09/22/2011, 09/13/2012  . Influenza-Unspecified 08/09/2013, 08/27/2014, 08/08/2015, 08/25/2016  . Tdap 09/13/2012   Pertinent  Health Maintenance Due  Topic Date Due  . DEXA SCAN  03/03/1984  . PNA vac Low Risk Adult (1 of 2 - PCV13) 03/03/1984  . INFLUENZA VACCINE  Completed   Fall Risk  03/21/2015 08/23/2014 09/29/2012 09/29/2012  Falls in the past year? No No No No   Functional Status Survey:    Vitals:   09/18/16 1428  BP: 126/64  Pulse: 66  Resp: 16  Temp: 98.1 F (36.7 C)  Weight: 107 lb 14.4 oz (48.9 kg)  Height: 5\' 4"  (1.626 m)   Body mass index is 18.52 kg/m. Physical Exam  Constitutional: She is oriented to person, place, and time. She appears well-developed and well-nourished. No distress.  HENT:  Head: Normocephalic and  atraumatic.  Right Ear: External ear normal.  Left Ear: External ear normal.  Eyes: Conjunctivae and EOM are normal. Pupils are equal, round, and reactive to light.  Corrective lenses. Poor vision. Can only see light from left eye.  Neck: Neck supple. No JVD present. No tracheal deviation present. No thyromegaly present.  Cardiovascular: Normal rate, regular rhythm and intact distal pulses.  Exam reveals no gallop and no friction rub.   Murmur (2/6 aortic ejection murmur) heard. Pulmonary/Chest: Breath sounds normal. No respiratory distress. She has no wheezes. She exhibits no tenderness.  Abdominal: Soft. Bowel sounds are normal. She exhibits no distension. There is no tenderness.  Musculoskeletal: She exhibits no edema or tenderness.  Unstable gait. Using walker. The right shoulder reduced overhead ROM  Neurological: She is alert and oriented to person, place, and time. She has normal reflexes. No cranial nerve deficit. Coordination normal.  memory deficit 01/10/15 MMSE 25/30. Passed clock drawing.  Skin:  No rash noted. No erythema. No pallor.  Psychiatric: She has a normal mood and affect. Her behavior is normal. Thought content normal.  Lots of anxiety    Labs reviewed:  Recent Labs  06/11/16  NA 142  K 4.4  BUN 23*  CREATININE 1.1    Recent Labs  06/11/16  AST 16  ALT 12  ALKPHOS 65    Recent Labs  06/11/16  WBC 3.2  HGB 10.7*  HCT 32*  PLT 171   Lab Results  Component Value Date   TSH 2.28 06/11/2016   No results found for: HGBA1C Lab Results  Component Value Date   CHOL 207 (A) 08/28/2014   HDL 80 (A) 08/28/2014   LDLCALC 113 08/28/2014   TRIG 70 08/28/2014    Assessment/Plan  1. Alzheimer's disease of other onset without behavioral disturbance stable  2. Essential hypertension controlled  3. Anemia in stage 1 chronic kidney disease stable  4. Stage 1 chronic kidney disease stable

## 2016-10-23 ENCOUNTER — Encounter: Payer: Self-pay | Admitting: Nurse Practitioner

## 2016-10-23 ENCOUNTER — Non-Acute Institutional Stay (SKILLED_NURSING_FACILITY): Payer: Medicare Other | Admitting: Nurse Practitioner

## 2016-10-23 DIAGNOSIS — G301 Alzheimer's disease with late onset: Secondary | ICD-10-CM

## 2016-10-23 DIAGNOSIS — D631 Anemia in chronic kidney disease: Secondary | ICD-10-CM

## 2016-10-23 DIAGNOSIS — F0280 Dementia in other diseases classified elsewhere without behavioral disturbance: Secondary | ICD-10-CM

## 2016-10-23 DIAGNOSIS — N318 Other neuromuscular dysfunction of bladder: Secondary | ICD-10-CM

## 2016-10-23 DIAGNOSIS — K5900 Constipation, unspecified: Secondary | ICD-10-CM

## 2016-10-23 DIAGNOSIS — I10 Essential (primary) hypertension: Secondary | ICD-10-CM

## 2016-10-23 DIAGNOSIS — N181 Chronic kidney disease, stage 1: Secondary | ICD-10-CM

## 2016-10-23 NOTE — Progress Notes (Signed)
Location:  [facility] Room Number: 55 Place of Service:  SNF (31) Provider:  Mast, Manxie  NP   Jeanmarie Hubert, MD  Patient Care Team: Estill Dooms, MD as PCP - General (Internal Medicine) [place] Minus Breeding, MD as Consulting Physician (Cardiology) R Crecencio Mc, MD as Consulting Physician (Ophthalmology) Leta Baptist, MD as Consulting Physician (Otolaryngology) Man Otho Darner, NP as Nurse Practitioner (Internal Medicine)  Extended Emergency Contact Information Primary Emergency Contact: [place] of [place] Phone: [phone] Mobile Phone: [phone] Relation: Friend Secondary Emergency Contact: Daniel,Gloria Address: [place], [place] of [place] Phone: [phone] Mobile Phone: [phone] Relation: Friend  Code Status:  DNR Goals of care: Advanced Directive information Advanced Directives 10/23/2016  Does Patient Have a Medical Advance Directive? Yes  Type of Paramedic of [place];Living will  Does patient want to make changes to medical advance directive? No - Patient declined  Copy of [place] in Chart? Yes  Pre-existing out of facility DNR order (yellow form or pink MOST form) -     Chief Complaint  Patient presents with  . Medical Management of Chronic Issues    HPI:  Pt is a 82 y.o. female seen today for medical management of chronic diseases.      Hx of dementia, taking Aricept presently. She needs MOM 30mg  qhs and MiraLax for constipation, Lorazepam for anxiety/sleep,  Stabilized, off Mirtazapine 7.5mg  hs since 07/31/16.Metoprolol 25mg  for heart rate as needed, arthritic pain, mainly in the right shoulder injured from falling 12/2014, healed with reduced ROM            Past Medical History:  Diagnosis Date  . Alzheimer's disease 01/10/2015   MMSE 25/30  . Anxiety    mild  . Anxiety 01/11/2014  . Aortic  stenosis    a. Echo 03/2010:  EF 70-75%, mild AS  . Cerebral atrophy 01/10/2015  . Cerebrovascular disease 11/01/2005   Small vessel disease  . Chronic neck pain 08/06/2011  . Closed pelvic fracture ([place]) 09/29/12   left nondisplaced inferior pelvic ramus  . Glaucoma    "both eyes; no vision in left eye as a result" (04/06/2013)  . History of recurrent UTIs   . Hypertension   . Osteoarthritis 08/06/2011  . Pneumonia 04/06/2013   "for the first time" (04/06/2013)  . PVC (premature ventricular contraction)   . Senile osteoporosis 04/08/2011  . SVT (supraventricular tachycardia) ([place])   . Thumb pain 08/06/2011  . Vision loss of left eye    "can't see at all out of it" (04/06/2013)   Past Surgical History:  Procedure Laterality Date  . APPENDECTOMY    . CATARACT EXTRACTION, BILATERAL Bilateral   . GLAUCOMA SURGERY Left    "put in stent for glaucoma" (04/06/2013)  . TONSILLECTOMY      Allergies  Allergen Reactions  . Codeine Nausea Only  . Sulfate     Allergies as of 10/23/2016      Reactions   Codeine Nausea Only   Sulfate       Medication List       Accurate as of 10/23/16  2:00 PM. Always use your most recent med list.          acetaminophen 325 MG tablet Commonly known as:  TYLENOL Take 650 mg by mouth every 4 (four) hours as needed. Take 2 every 6 hours  as needed   ACULAR LS 0.4 % Soln Generic drug:  ketorolac Place 1 drop into the left eye 4 (four) times daily as needed (pain).   Calcium Citrate-Vitamin D 315-250 MG-UNIT Tabs Take by mouth. Take one tablet daily   donepezil 5 MG tablet Commonly known as:  ARICEPT One daily to help preserve memory   feeding supplement Liqd Take 1 Container by mouth. Drink 4 oz twice daily   magnesium hydroxide 400 MG/5ML suspension Commonly known as:  MILK OF MAGNESIA Take 30 mLs by mouth at bedtime. for constipation   polyethylene glycol packet Commonly known as:  MIRALAX / GLYCOLAX Take 17 g by mouth daily as needed  for mild constipation.   vitamin C 500 MG tablet Commonly known as:  ASCORBIC ACID Take 500 mg by mouth daily.       Review of Systems  Constitutional: Negative for chills, diaphoresis and fever.  HENT: Positive for hearing loss. Negative for congestion, ear discharge, ear pain, nosebleeds and tinnitus.   Eyes: Negative for photophobia, pain and discharge.       Poor vision and glaucoma. Left eye has no vision  Respiratory: Negative.  Negative for cough, shortness of breath and wheezing.   Cardiovascular: Negative for chest pain, palpitations and leg swelling.  Gastrointestinal: Positive for constipation. Negative for abdominal pain, blood in stool, diarrhea, nausea and vomiting.  Genitourinary: Positive for frequency. Negative for dysuria, hematuria and urgency.  Musculoskeletal: Negative for back pain, myalgias and neck pain.       The right shoulder pain with overhead ROM  Skin: Negative.  Negative for rash.  Neurological: Negative for dizziness, tremors and weakness.       Forgetful.  Hematological: Does not bruise/bleed easily.  Psychiatric/Behavioral: Positive for confusion and decreased concentration. Negative for hallucinations and suicidal ideas. The patient is nervous/anxious.     Immunization History  Administered Date(s) Administered  . Influenza Split 09/22/2011, 09/13/2012  . Influenza-Unspecified 08/09/2013, 08/27/2014, 08/08/2015, 08/25/2016  . Tdap 09/13/2012   Pertinent  Health Maintenance Due  Topic Date Due  . DEXA SCAN  03/03/1984  . PNA vac Low Risk Adult (1 of 2 - PCV13) 03/03/1984  . INFLUENZA VACCINE  Completed   Fall Risk  03/21/2015 08/23/2014 09/29/2012 09/29/2012  Falls in the past year? No No No No   Functional Status Survey:    Vitals:   10/23/16 1030  BP: 137/63  Pulse: 67  Resp: 18  Temp: 97.7 F (36.5 C)  SpO2: 93%  Weight: 106 lb 6.4 oz (48.3 kg)  Height: 5\' 4"  (1.626 m)   Body mass index is 18.26 kg/m. Physical Exam    Constitutional: She is oriented to person, place, and time. She appears well-developed and well-nourished. No distress.  HENT:  Head: Normocephalic and atraumatic.  Right Ear: External ear normal.  Left Ear: External ear normal.  Eyes: Conjunctivae and EOM are normal. Pupils are equal, round, and reactive to light.  Corrective lenses. Poor vision. Can only see light from left eye.  Neck: Neck supple. No JVD present. No tracheal deviation present. No thyromegaly present.  Cardiovascular: Normal rate, regular rhythm and intact distal pulses.  Exam reveals no gallop and no friction rub.   Murmur (2/6 aortic ejection murmur) heard. Pulmonary/Chest: Breath sounds normal. No respiratory distress. She has no wheezes. She exhibits no tenderness.  Abdominal: Soft. Bowel sounds are normal. She exhibits no distension. There is no tenderness.  Musculoskeletal: She exhibits no edema or tenderness.  Unstable gait.  Using walker. The right shoulder reduced overhead ROM  Neurological: She is alert and oriented to person, place, and time. She has normal reflexes. No cranial nerve deficit. Coordination normal.  memory deficit 01/10/15 MMSE 25/30. Passed clock drawing.  Skin: No rash noted. No erythema. No pallor.  Psychiatric: She has a normal mood and affect. Her behavior is normal. Thought content normal.  Lots of anxiety    Labs reviewed:  Recent Labs  06/11/16  NA 142  K 4.4  BUN 23*  CREATININE 1.1    Recent Labs  06/11/16  AST 16  ALT 12  ALKPHOS 65    Recent Labs  06/11/16  WBC 3.2  HGB 10.7*  HCT 32*  PLT 171   Lab Results  Component Value Date   TSH 2.28 06/11/2016   No results found for: HGBA1C Lab Results  Component Value Date   CHOL 207 (A) 08/28/2014   HDL 80 (A) 08/28/2014   LDLCALC 113 08/28/2014   TRIG 70 08/28/2014    Significant Diagnostic Results in last 30 days:  No results found.  Assessment/Plan HTN (hypertension) Controlled, off Metoprolol.    Constipation Stable, MOM 75ml qhs, MiraLax prn  Alzheimer's disease Continue to decline cognitively, adjusted to SNF, cotninue Aricept.  Anemia in chronic kidney disease Last Hgb 10.7 06/11/16  Chronic kidney disease Last Bun 23, creat 1.11  Hypertonicity of bladder Managed with adult brief, not on med    Family/ staff Communication: SNF  Labs/tests ordered:  none

## 2016-10-23 NOTE — Assessment & Plan Note (Signed)
Managed with adult brief, not on med 

## 2016-10-23 NOTE — Assessment & Plan Note (Signed)
Last Bun 23, creat 1.11

## 2016-10-23 NOTE — Assessment & Plan Note (Signed)
Continue to decline cognitively, adjusted to SNF, cotninue Aricept.

## 2016-10-23 NOTE — Assessment & Plan Note (Signed)
Controlled, off Metoprolol.

## 2016-10-23 NOTE — Assessment & Plan Note (Signed)
Stable, MOM 40ml qhs, MiraLax prn

## 2016-10-23 NOTE — Assessment & Plan Note (Signed)
Last Hgb 10.7 06/11/16

## 2016-11-19 ENCOUNTER — Non-Acute Institutional Stay (SKILLED_NURSING_FACILITY): Payer: Medicare Other | Admitting: Internal Medicine

## 2016-11-19 ENCOUNTER — Encounter: Payer: Self-pay | Admitting: Internal Medicine

## 2016-11-19 DIAGNOSIS — F0280 Dementia in other diseases classified elsewhere without behavioral disturbance: Secondary | ICD-10-CM | POA: Diagnosis not present

## 2016-11-19 DIAGNOSIS — I10 Essential (primary) hypertension: Secondary | ICD-10-CM

## 2016-11-19 DIAGNOSIS — G301 Alzheimer's disease with late onset: Secondary | ICD-10-CM

## 2016-11-19 NOTE — Progress Notes (Signed)
Progress Note    Location:  [facility] Room Number: N55 Place of Service:  SNF [phone]) Provider:  Jeanmarie Hubert, MD  Patient Care Team: Estill Dooms, MD as PCP - General (Internal Medicine) [place] Minus Breeding, MD as Consulting Physician (Cardiology) R Crecencio Mc, MD as Consulting Physician (Ophthalmology) Leta Baptist, MD as Consulting Physician (Otolaryngology) Man Otho Darner, NP as Nurse Practitioner (Internal Medicine)  Extended Emergency Contact Information Primary Emergency Contact: McGreggor,Wynn  [place] of [place] Phone: [phone] Mobile Phone: [phone] Relation: Friend Secondary Emergency Contact: Daniel,Gloria Address: [place], [place] of [place] Phone: [phone] Mobile Phone: [phone] Relation: Friend  Code Status:  DNR Goals of care: Advanced Directive information Advanced Directives 11/19/2016  Does Patient Have a Medical Advance Directive? Yes  Type of Paramedic of [place];Living will;Out of facility DNR (pink MOST or yellow form)  Does patient want to make changes to medical advance directive? -  Copy of [place] in Chart? Yes  Pre-existing out of facility DNR order (yellow form or pink MOST form) Yellow form placed in chart (order not valid for inpatient use)     Chief Complaint  Patient presents with  . Acute Visit    fell backwards onto bed, runny nose, coughing, eyes red, feels weak, refused Tamiflu, temp this am 99.1    HPI:  Pt is a 83 y.o. female seen today for an acute visit for evaluation of reports from staff that she fell this AM and has rhinorrhea, cough, weakness.  Patient has some memory issues, but she denies any problems and does not recall the fall on to her bed this AM. She says she is doing well.   Past Medical History:  Diagnosis Date  . Alzheimer's disease 01/10/2015   MMSE  25/30  . Anxiety    mild  . Anxiety 01/11/2014  . Aortic stenosis    a. Echo 03/2010:  EF 70-75%, mild AS  . Cerebral atrophy 01/10/2015  . Cerebrovascular disease 11/01/2005   Small vessel disease  . Chronic neck pain 08/06/2011  . Closed pelvic fracture ([place]) 09/29/12   left nondisplaced inferior pelvic ramus  . Glaucoma    "both eyes; no vision in left eye as a result" (04/06/2013)  . History of recurrent UTIs   . Hypertension   . Osteoarthritis 08/06/2011  . Pneumonia 04/06/2013   "for the first time" (04/06/2013)  . PVC (premature ventricular contraction)   . Senile osteoporosis 04/08/2011  . SVT (supraventricular tachycardia) ([place])   . Thumb pain 08/06/2011  . Vision loss of left eye    "can't see at all out of it" (04/06/2013)   Past Surgical History:  Procedure Laterality Date  . APPENDECTOMY    . CATARACT EXTRACTION, BILATERAL Bilateral   . GLAUCOMA SURGERY Left    "put in stent for glaucoma" (04/06/2013)  . TONSILLECTOMY      Allergies  Allergen Reactions  . Codeine Nausea Only  . Sulfate     Allergies as of 11/19/2016      Reactions   Codeine Nausea Only   Sulfate       Medication List       Accurate as of 11/19/16  1:10 PM. Always use your most recent med list.          acetaminophen 325 MG tablet Commonly known as:  TYLENOL Take 650  mg by mouth every 4 (four) hours as needed. Take 2 every 6 hours as needed   ACULAR LS 0.4 % Soln Generic drug:  ketorolac Place 1 drop into the left eye 4 (four) times daily as needed (pain).   Calcium Citrate-Vitamin D 315-250 MG-UNIT Tabs Take by mouth. Take one tablet daily   donepezil 5 MG tablet Commonly known as:  ARICEPT One daily to help preserve memory   feeding supplement Liqd Take 1 Container by mouth. Drink 4 oz twice daily   magnesium hydroxide 400 MG/5ML suspension Commonly known as:  MILK OF MAGNESIA Take 30 mLs by mouth at bedtime. for constipation   oseltamivir 75 MG capsule Commonly known as:   TAMIFLU Take 75 mg by mouth. Take one capsule daily for 14 days   polyethylene glycol packet Commonly known as:  MIRALAX / GLYCOLAX Take 17 g by mouth daily as needed for mild constipation.   vitamin C 500 MG tablet Commonly known as:  ASCORBIC ACID Take 500 mg by mouth daily.       Review of Systems  Constitutional: Negative for chills, diaphoresis and fever.  HENT: Positive for hearing loss. Negative for congestion, ear discharge, ear pain, nosebleeds and tinnitus.   Eyes: Negative for photophobia, pain and discharge.       Poor vision and glaucoma. Left eye has no vision  Respiratory: Negative.  Negative for cough, shortness of breath and wheezing.   Cardiovascular: Negative for chest pain, palpitations and leg swelling.  Gastrointestinal: Positive for constipation. Negative for abdominal pain, blood in stool, diarrhea, nausea and vomiting.  Genitourinary: Positive for frequency. Negative for dysuria, hematuria and urgency.  Musculoskeletal: Negative for back pain, myalgias and neck pain.       The right shoulder pain with overhead ROM  Skin: Negative.  Negative for rash.  Neurological: Negative for dizziness, tremors and weakness.       Forgetful.  Hematological: Does not bruise/bleed easily.  Psychiatric/Behavioral: Positive for confusion and decreased concentration. Negative for hallucinations and suicidal ideas. The patient is nervous/anxious.     Immunization History  Administered Date(s) Administered  . Influenza Split 09/22/2011, 09/13/2012  . Influenza-Unspecified 08/09/2013, 08/27/2014, 08/08/2015, 08/25/2016  . Tdap 09/13/2012   Pertinent  Health Maintenance Due  Topic Date Due  . DEXA SCAN  03/03/1984  . PNA vac Low Risk Adult (1 of 2 - PCV13) 03/03/1984  . INFLUENZA VACCINE  Completed   Fall Risk  03/21/2015 08/23/2014 09/29/2012 09/29/2012  Falls in the past year? No No No No     Vitals:   11/19/16 1302  BP: (!) 145/88  Pulse: 80  Resp: 12  Temp:  98.1 F (36.7 C)  Weight: 107 lb (48.5 kg)  Height: 5\' 4"  (1.626 m)   Body mass index is 18.37 kg/m. Physical Exam  Constitutional: She is oriented to person, place, and time. She appears well-developed and well-nourished. No distress.  HENT:  Head: Normocephalic and atraumatic.  Right Ear: External ear normal.  Left Ear: External ear normal.  Eyes: Conjunctivae and EOM are normal. Pupils are equal, round, and reactive to light.  Corrective lenses. Poor vision. Can only see light from left eye.  Neck: Neck supple. No JVD present. No tracheal deviation present. No thyromegaly present.  Cardiovascular: Normal rate, regular rhythm and intact distal pulses.  Exam reveals no gallop and no friction rub.   Murmur (2/6 aortic ejection murmur) heard. Pulmonary/Chest: Breath sounds normal. No respiratory distress. She has no wheezes. She exhibits  no tenderness.  Abdominal: Soft. Bowel sounds are normal. She exhibits no distension. There is no tenderness.  Musculoskeletal: She exhibits no edema or tenderness.  Unstable gait. Using walker. The right shoulder reduced overhead ROM  Neurological: She is alert and oriented to person, place, and time. She has normal reflexes. No cranial nerve deficit. Coordination normal.  memory deficit 01/10/15 MMSE 25/30. Passed clock drawing.  Skin: No rash noted. No erythema. No pallor.  Psychiatric: She has a normal mood and affect. Her behavior is normal. Thought content normal.  Lots of anxiety    Labs reviewed:  Recent Labs  06/11/16  NA 142  K 4.4  BUN 23*  CREATININE 1.1    Recent Labs  06/11/16  AST 16  ALT 12  ALKPHOS 65    Recent Labs  06/11/16  WBC 3.2  HGB 10.7*  HCT 32*  PLT 171   Lab Results  Component Value Date   TSH 2.28 06/11/2016   No results found for: HGBA1C Lab Results  Component Value Date   CHOL 207 (A) 08/28/2014   HDL 80 (A) 08/28/2014   LDLCALC 113 08/28/2014   TRIG 70 08/28/2014     Assessment/Plan  Patient denies any problems.  1. Late onset Alzheimer's disease without behavioral disturbance stable  2. Essential hypertension controlled

## 2016-12-21 ENCOUNTER — Encounter: Payer: Self-pay | Admitting: Nurse Practitioner

## 2016-12-21 ENCOUNTER — Non-Acute Institutional Stay (SKILLED_NURSING_FACILITY): Payer: Medicare Other | Admitting: Nurse Practitioner

## 2016-12-21 DIAGNOSIS — I471 Supraventricular tachycardia: Secondary | ICD-10-CM | POA: Diagnosis not present

## 2016-12-21 DIAGNOSIS — N181 Chronic kidney disease, stage 1: Secondary | ICD-10-CM

## 2016-12-21 DIAGNOSIS — I10 Essential (primary) hypertension: Secondary | ICD-10-CM

## 2016-12-21 DIAGNOSIS — F0280 Dementia in other diseases classified elsewhere without behavioral disturbance: Secondary | ICD-10-CM | POA: Diagnosis not present

## 2016-12-21 DIAGNOSIS — G301 Alzheimer's disease with late onset: Secondary | ICD-10-CM | POA: Diagnosis not present

## 2016-12-21 DIAGNOSIS — D631 Anemia in chronic kidney disease: Secondary | ICD-10-CM

## 2016-12-21 DIAGNOSIS — F411 Generalized anxiety disorder: Secondary | ICD-10-CM | POA: Diagnosis not present

## 2016-12-21 DIAGNOSIS — K5900 Constipation, unspecified: Secondary | ICD-10-CM

## 2016-12-21 NOTE — Assessment & Plan Note (Signed)
Controlled, not taking meds.  

## 2016-12-21 NOTE — Assessment & Plan Note (Signed)
Last Hgb 10.7 06/11/16, Bun 23, creat 1.11 06/11/16

## 2016-12-21 NOTE — Assessment & Plan Note (Signed)
Continue to decline cognitively, adjusted to SNF, cotninue Aricept.

## 2016-12-21 NOTE — Assessment & Plan Note (Signed)
Stable, off Mirtazapine and Lorazepam.  

## 2016-12-21 NOTE — Progress Notes (Signed)
Location:  [facility] Room Number: 10 Place of Service:  SNF (31) Provider:  Mast, Manxie  NP  Jeanmarie Hubert, MD  Patient Care Team: Estill Dooms, MD as PCP - General (Internal Medicine) [place] Minus Breeding, MD as Consulting Physician (Cardiology) R Crecencio Mc, MD as Consulting Physician (Ophthalmology) Leta Baptist, MD as Consulting Physician (Otolaryngology) Man Otho Darner, NP as Nurse Practitioner (Internal Medicine)  Extended Emergency Contact Information Primary Emergency Contact: Vinson Moselle of [place] Phone: [phone] Mobile Phone: [phone] Relation: Friend Secondary Emergency Contact: Daniel,Gloria Address: [place], [place] of [place] Phone: [phone] Mobile Phone: [phone] Relation: Friend  Code Status:  DNR Goals of care: Advanced Directive information Advanced Directives 12/21/2016  Does Patient Have a Medical Advance Directive? Yes  Type of Paramedic of [place];Living will;Out of facility DNR (pink MOST or yellow form)  Does patient want to make changes to medical advance directive? No - Patient declined  Copy of [place] in Chart? Yes  Pre-existing out of facility DNR order (yellow form or pink MOST form) Yellow form placed in chart (order not valid for inpatient use)     Chief Complaint  Patient presents with  . Medical Management of Chronic Issues    HPI:  Pt is a 83 y.o. female seen today for medical management of chronic diseases.    Hx of dementia, taking Aricept presently. She takes MOM 30mg  qhs and prn MiraLax for constipation, stable anxiety/sleep, offMirtazapine 7.5mg  hs since 07/31/16. Arthritic pain, mainly in the right shoulder injured from falling 12/2014, healed with reduced ROM  Past Medical History:  Diagnosis Date  . Alzheimer's disease 01/10/2015   MMSE 25/30  . Anxiety     mild  . Anxiety 01/11/2014  . Aortic stenosis    a. Echo 03/2010:  EF 70-75%, mild AS  . Cerebral atrophy 01/10/2015  . Cerebrovascular disease 11/01/2005   Small vessel disease  . Chronic neck pain 08/06/2011  . Closed pelvic fracture ([place]) 09/29/12   left nondisplaced inferior pelvic ramus  . Glaucoma    "both eyes; no vision in left eye as a result" (04/06/2013)  . History of recurrent UTIs   . Hypertension   . Osteoarthritis 08/06/2011  . Pneumonia 04/06/2013   "for the first time" (04/06/2013)  . PVC (premature ventricular contraction)   . Senile osteoporosis 04/08/2011  . SVT (supraventricular tachycardia) ([place])   . Thumb pain 08/06/2011  . Vision loss of left eye    "can't see at all out of it" (04/06/2013)   Past Surgical History:  Procedure Laterality Date  . APPENDECTOMY    . CATARACT EXTRACTION, BILATERAL Bilateral   . GLAUCOMA SURGERY Left    "put in stent for glaucoma" (04/06/2013)  . TONSILLECTOMY      Allergies  Allergen Reactions  . Codeine Nausea Only  . Sulfate     Allergies as of 12/21/2016      Reactions   Codeine Nausea Only   Sulfate       Medication List       Accurate as of 12/21/16  4:03 PM. Always use your most recent med list.          acetaminophen 325 MG tablet Commonly known as:  TYLENOL Take 650 mg by mouth every 4 (four) hours as needed. Take 2 every 6 hours as needed  ACULAR LS 0.4 % Soln Generic drug:  ketorolac Place 1 drop into the left eye 4 (four) times daily as needed (pain).   Calcium Citrate-Vitamin D 315-250 MG-UNIT Tabs Take by mouth. Take one tablet daily   donepezil 5 MG tablet Commonly known as:  ARICEPT One daily to help preserve memory   feeding supplement Liqd Take 1 Container by mouth. Drink 4 oz twice daily   magnesium hydroxide 400 MG/5ML suspension Commonly known as:  MILK OF MAGNESIA Take 30 mLs by mouth at bedtime. for constipation   polyethylene glycol packet Commonly known as:  MIRALAX /  GLYCOLAX Take 17 g by mouth daily as needed for mild constipation.   vitamin C 500 MG tablet Commonly known as:  ASCORBIC ACID Take 500 mg by mouth daily.       Review of Systems  Constitutional: Negative for chills, diaphoresis and fever.  HENT: Positive for hearing loss. Negative for congestion, ear discharge, ear pain, nosebleeds and tinnitus.   Eyes: Negative for photophobia, pain and discharge.       Poor vision and glaucoma. Left eye has no vision  Respiratory: Negative.  Negative for cough, shortness of breath and wheezing.   Cardiovascular: Negative for chest pain, palpitations and leg swelling.  Gastrointestinal: Positive for constipation. Negative for abdominal pain, blood in stool, diarrhea, nausea and vomiting.  Genitourinary: Positive for frequency. Negative for dysuria, hematuria and urgency.  Musculoskeletal: Negative for back pain, myalgias and neck pain.       The right shoulder pain with overhead ROM  Skin: Negative.  Negative for rash.  Neurological: Negative for dizziness, tremors and weakness.       Forgetful.  Hematological: Does not bruise/bleed easily.  Psychiatric/Behavioral: Positive for confusion and decreased concentration. Negative for hallucinations and suicidal ideas. The patient is nervous/anxious.     Immunization History  Administered Date(s) Administered  . Influenza Split 09/22/2011, 09/13/2012  . Influenza-Unspecified 08/09/2013, 08/27/2014, 08/08/2015, 08/25/2016  . Tdap 09/13/2012   Pertinent  Health Maintenance Due  Topic Date Due  . DEXA SCAN  03/03/1984  . PNA vac Low Risk Adult (1 of 2 - PCV13) 03/03/1984  . INFLUENZA VACCINE  Completed   Fall Risk  03/21/2015 08/23/2014 09/29/2012 09/29/2012  Falls in the past year? No No No No   Functional Status Survey:    Vitals:   12/21/16 1332  BP: (!) 144/79  Pulse: 74  Resp: 18  Temp: 98.2 F (36.8 C)  Weight: 106 lb 12.8 oz (48.4 kg)  Height: 5\' 4"  (1.626 m)   Body mass index  is 18.33 kg/m. Physical Exam  Constitutional: She is oriented to person, place, and time. She appears well-developed and well-nourished. No distress.  HENT:  Head: Normocephalic and atraumatic.  Right Ear: External ear normal.  Left Ear: External ear normal.  Eyes: Conjunctivae and EOM are normal. Pupils are equal, round, and reactive to light.  Corrective lenses. Poor vision. Can only see light from left eye.  Neck: Neck supple. No JVD present. No tracheal deviation present. No thyromegaly present.  Cardiovascular: Normal rate, regular rhythm and intact distal pulses.  Exam reveals no gallop and no friction rub.   Murmur (2/6 aortic ejection murmur) heard. Pulmonary/Chest: Breath sounds normal. No respiratory distress. She has no wheezes. She exhibits no tenderness.  Abdominal: Soft. Bowel sounds are normal. She exhibits no distension. There is no tenderness.  Musculoskeletal: She exhibits no edema or tenderness.  Unstable gait. Using walker. The right shoulder reduced overhead  ROM  Neurological: She is alert and oriented to person, place, and time. She has normal reflexes. No cranial nerve deficit. Coordination normal.  memory deficit 01/10/15 MMSE 25/30. Passed clock drawing.  Skin: No rash noted. No erythema. No pallor.  Psychiatric: She has a normal mood and affect. Her behavior is normal. Thought content normal.  Lots of anxiety    Labs reviewed:  Recent Labs  06/11/16  NA 142  K 4.4  BUN 23*  CREATININE 1.1    Recent Labs  06/11/16  AST 16  ALT 12  ALKPHOS 65    Recent Labs  06/11/16  WBC 3.2  HGB 10.7*  HCT 32*  PLT 171   Lab Results  Component Value Date   TSH 2.28 06/11/2016   No results found for: HGBA1C Lab Results  Component Value Date   CHOL 207 (A) 08/28/2014   HDL 80 (A) 08/28/2014   LDLCALC 113 08/28/2014   TRIG 70 08/28/2014    Significant Diagnostic Results in last 30 days:  No results found.  Assessment/Plan HTN  (hypertension) Controlled, not taking meds  SVT (supraventricular tachycardia) Heart rate is in controlled, off Metoprolol.   Constipation Stable, continue MOM 74ml qhs, MiraLax prn  Alzheimer's disease Continue to decline cognitively, adjusted to SNF, cotninue Aricept.  Anemia in chronic kidney disease Last Hgb 10.7 06/11/16, Bun 23, creat 1.11 06/11/16  Anxiety state Stable, off Mirtazapine and Lorazepam.      Family/ staff Communication: SNF  Labs/tests ordered:  none

## 2016-12-21 NOTE — Assessment & Plan Note (Signed)
Heart rate is in controlled, off Metoprolol.  

## 2016-12-21 NOTE — Assessment & Plan Note (Signed)
Stable, continue MOM 73ml qhs, MiraLax prn

## 2016-12-29 DIAGNOSIS — Q150 Congenital glaucoma: Secondary | ICD-10-CM | POA: Diagnosis not present

## 2016-12-29 DIAGNOSIS — H401122 Primary open-angle glaucoma, left eye, moderate stage: Secondary | ICD-10-CM | POA: Diagnosis not present

## 2016-12-29 DIAGNOSIS — H353121 Nonexudative age-related macular degeneration, left eye, early dry stage: Secondary | ICD-10-CM | POA: Diagnosis not present

## 2016-12-29 DIAGNOSIS — H401112 Primary open-angle glaucoma, right eye, moderate stage: Secondary | ICD-10-CM | POA: Diagnosis not present

## 2017-01-04 ENCOUNTER — Non-Acute Institutional Stay (SKILLED_NURSING_FACILITY): Payer: Medicare Other | Admitting: Nurse Practitioner

## 2017-01-04 ENCOUNTER — Encounter: Payer: Self-pay | Admitting: Nurse Practitioner

## 2017-01-04 DIAGNOSIS — F0280 Dementia in other diseases classified elsewhere without behavioral disturbance: Secondary | ICD-10-CM

## 2017-01-04 DIAGNOSIS — F411 Generalized anxiety disorder: Secondary | ICD-10-CM

## 2017-01-04 DIAGNOSIS — K5900 Constipation, unspecified: Secondary | ICD-10-CM | POA: Diagnosis not present

## 2017-01-04 DIAGNOSIS — N181 Chronic kidney disease, stage 1: Secondary | ICD-10-CM | POA: Diagnosis not present

## 2017-01-04 DIAGNOSIS — G301 Alzheimer's disease with late onset: Secondary | ICD-10-CM

## 2017-01-04 DIAGNOSIS — W19XXXA Unspecified fall, initial encounter: Secondary | ICD-10-CM | POA: Diagnosis not present

## 2017-01-04 DIAGNOSIS — D631 Anemia in chronic kidney disease: Secondary | ICD-10-CM

## 2017-01-04 DIAGNOSIS — I10 Essential (primary) hypertension: Secondary | ICD-10-CM

## 2017-01-04 NOTE — Assessment & Plan Note (Signed)
Hgb 10.7 06/11/16

## 2017-01-04 NOTE — Progress Notes (Signed)
Location:  [facility] Room Number: 40 Place of Service:  SNF (31) Provider:  [place] Joines, Manxie  NP  Jeanmarie Hubert, MD  Patient Care Team: [place]ill Dooms, MD as PCP - General (Internal Medicine) [place] Minus Breeding, MD as Consulting Physician (Cardiology) R Crecencio Mc, MD as Consulting Physician (Ophthalmology) Leta Baptist, MD as Consulting Physician (Otolaryngology) [place] Otho Darner, NP as Nurse Practitioner (Internal Medicine)  Extended Emergency Contact Information Primary Emergency Contact: Vinson Moselle of [place] Phone: [phone] Mobile Phone: [phone] Relation: Friend Secondary Emergency Contact: Daniel,Gloria Address: [place], [place] of [place] Phone: [phone] Mobile Phone: [phone] Relation: Friend  Code Status:  DNR Goals of care: Advanced Directive information Advanced Directives 01/04/2017  Does Patient Have a Medical Advance Directive? Yes  Type of Paramedic of [place];Living will;Out of facility DNR (pink MOST or yellow form)  Does patient want to make changes to medical advance directive? No - Patient declined  Copy of [place] in Chart? Yes  Pre-existing out of facility DNR order (yellow form or pink MOST form) Yellow form placed in chart (order not valid for inpatient use)     Chief Complaint  Patient presents with  . Acute Visit    Fell-2/22 in hair salon, landed directly on her bottom.    HPI:  Pt is a 83 y.o. female seen today for an acute visit for fell 12/31/16, mechanical, sat on floor, denied pain today, ambulates with walker as usual.    Hx of dementia, taking Aricept presently. She takes MOM 30mg  qhs and prn MiraLax for constipation, stable anxiety/sleep, offMirtazapine 7.5mg  hs since 07/31/16. Arthritic pain, mainly in the right shoulder injured from falling 12/2014, healed with reduced  ROM  Past Medical History:  Diagnosis Date  . Alzheimer's disease 01/10/2015   MMSE 25/30  . Anxiety    mild  . Anxiety 01/11/2014  . Aortic stenosis    a. Echo 03/2010:  EF 70-75%, mild AS  . Cerebral atrophy 01/10/2015  . Cerebrovascular disease 11/01/2005   Small vessel disease  . Chronic neck pain 08/06/2011  . Closed pelvic fracture ([facility]) 09/29/12   left nondisplaced inferior pelvic ramus  . Glaucoma    "both eyes; no vision in left eye as a result" (04/06/2013)  . History of recurrent UTIs   . Hypertension   . Osteoarthritis 08/06/2011  . Pneumonia 04/06/2013   "for the first time" (04/06/2013)  . PVC (premature ventricular contraction)   . Senile osteoporosis 04/08/2011  . SVT (supraventricular tachycardia) ([place])   . Thumb pain 08/06/2011  . Vision loss of left eye    "can't see at all out of it" (04/06/2013)   Past Surgical History:  Procedure Laterality Date  . APPENDECTOMY    . CATARACT EXTRACTION, BILATERAL Bilateral   . GLAUCOMA SURGERY Left    "put in stent for glaucoma" (04/06/2013)  . TONSILLECTOMY      Allergies  Allergen Reactions  . Codeine Nausea Only  . Sulfate     Allergies as of 01/04/2017      Reactions   Codeine Nausea Only   Sulfate       Medication List       Accurate as of 01/04/17  3:29 PM. Always use your most recent med list.          acetaminophen 325 MG tablet Commonly known as:  TYLENOL Take 650 mg by mouth every 4 (four) hours as needed. Take 2 every 6 hours as needed   Calcium Citrate-Vitamin D 315-250 MG-UNIT Tabs Take by mouth. Take one tablet daily   donepezil 5 MG tablet Commonly known as:  ARICEPT One daily to help preserve memory   feeding supplement Liqd Take 1 Container by mouth. Drink 4 oz twice daily   magnesium hydroxide 400 MG/5ML suspension Commonly known as:  MILK OF MAGNESIA Take 30 mLs by mouth at bedtime. for constipation   polyethylene glycol packet Commonly known as:  MIRALAX / GLYCOLAX Take 17 g  by mouth daily as needed for mild constipation.   polyvinyl alcohol 1.4 % ophthalmic solution Commonly known as:  LIQUIFILM TEARS Place 1 drop into both eyes 4 (four) times daily.   vitamin C 500 MG tablet Commonly known as:  ASCORBIC ACID Take 500 mg by mouth daily.       Review of Systems  Constitutional: Negative for chills, diaphoresis and fever.  HENT: Positive for hearing loss. Negative for congestion, ear discharge, ear pain, nosebleeds and tinnitus.   Eyes: Negative for photophobia, pain and discharge.       Poor vision and glaucoma. Left eye has no vision  Respiratory: Negative.  Negative for cough, shortness of breath and wheezing.   Cardiovascular: Negative for chest pain, palpitations and leg swelling.  Gastrointestinal: Positive for constipation. Negative for abdominal pain, blood in stool, diarrhea, nausea and vomiting.  Genitourinary: Positive for frequency. Negative for dysuria, hematuria and urgency.  Musculoskeletal: Negative for back pain, myalgias and neck pain.       The right shoulder pain with overhead ROM  Skin: Negative.  Negative for rash.  Neurological: Negative for dizziness, tremors and weakness.       Forgetful.  Hematological: Does not bruise/bleed easily.  Psychiatric/Behavioral: Positive for confusion and decreased concentration. Negative for hallucinations and suicidal ideas. The patient is nervous/anxious.     Immunization History  Administered Date(s) Administered  . Influenza Split 09/22/2011, 09/13/2012  . Influenza-Unspecified 08/09/2013, 08/27/2014, 08/08/2015, 08/25/2016  . Tdap 09/13/2012   Pertinent  Health Maintenance Due  Topic Date Due  . DEXA SCAN  03/03/1984  . PNA vac Low Risk Adult (1 of 2 - PCV13) 03/03/1984  . INFLUENZA VACCINE  Completed   Fall Risk  03/21/2015 08/23/2014 09/29/2012 09/29/2012  Falls in the past year? No No No No   Functional Status Survey:    Vitals:   01/04/17 1136  BP: (!) 130/52  Pulse: 71    Resp: 18  Temp: 98.2 F (36.8 C)  Weight: 106 lb 9.6 oz (48.4 kg)  Height: 5\' 4"  (1.626 m)   Body mass index is 18.3 kg/m. Physical Exam  Constitutional: She is oriented to person, place, and time. She appears well-developed and well-nourished. No distress.  HENT:  Head: Normocephalic and atraumatic.  Right Ear: External ear normal.  Left Ear: External ear normal.  Eyes: Conjunctivae and EOM are normal. Pupils are equal, round, and reactive to light.  Corrective lenses. Poor vision. Can only see light from left eye.  Neck: Neck supple. No JVD present. No tracheal deviation present. No thyromegaly present.  Cardiovascular: Normal rate, regular rhythm and intact distal pulses.  Exam reveals no gallop and no friction rub.   Murmur (2/6 aortic ejection murmur) heard. Pulmonary/Chest: Breath sounds normal. No respiratory distress. She has no wheezes. She exhibits no tenderness.  Abdominal: Soft. Bowel sounds are normal. She exhibits no distension. There  is no tenderness.  Musculoskeletal: She exhibits no edema or tenderness.  Unstable gait. Using walker. The right shoulder reduced overhead ROM  Neurological: She is alert and oriented to person, place, and time. She has normal reflexes. No cranial nerve deficit. Coordination normal.  memory deficit 01/10/15 MMSE 25/30. Passed clock drawing.  Skin: No rash noted. No erythema. No pallor.  Psychiatric: She has a normal mood and affect. Her behavior is normal. Thought content normal.  Lots of anxiety    Labs reviewed:  Recent Labs  06/11/16  NA 142  K 4.4  BUN 23*  CREATININE 1.1    Recent Labs  06/11/16  AST 16  ALT 12  ALKPHOS 65    Recent Labs  06/11/16  WBC 3.2  HGB 10.7*  HCT 32*  PLT 171   Lab Results  Component Value Date   TSH 2.28 06/11/2016   No results found for: HGBA1C Lab Results  Component Value Date   CHOL 207 (A) 08/28/2014   HDL 80 (A) 08/28/2014   LDLCALC 113 08/28/2014   TRIG 70 08/28/2014     Significant Diagnostic Results in last 30 days:  No results found.  Assessment/Plan Fall 12/31/16 at hair salon, sat on floor, denied HA, dizziness, palpitation, LOC, chest pain, SOB associated with falling, no new pain, ambulates with walker as usual. Increased frailty and lack of safety awareness contributory, close supervision and assistance needed for safety.   HTN (hypertension) Controlled, not taking meds  Constipation Stable, MOM 21ml qhs, MiraLax prn  Alzheimer's disease Continue to decline cognitively, adjusted to SNF, cotninue Aricept.  Anemia in chronic kidney disease Hgb 10.7 06/11/16  Anxiety state Stable, off Mirtazapine and Lorazepam.      Family/ staff Communication: SNF  Labs/tests ordered:  None

## 2017-01-04 NOTE — Assessment & Plan Note (Signed)
Stable, MOM 3ml qhs, MiraLax prn

## 2017-01-04 NOTE — Assessment & Plan Note (Signed)
12/31/16 at hair salon, sat on floor, denied HA, dizziness, palpitation, LOC, chest pain, SOB associated with falling, no new pain, ambulates with walker as usual. Increased frailty and lack of safety awareness contributory, close supervision and assistance needed for safety.

## 2017-01-04 NOTE — Assessment & Plan Note (Signed)
Continue to decline cognitively, adjusted to SNF, cotninue Aricept.

## 2017-01-04 NOTE — Assessment & Plan Note (Signed)
Stable, off Mirtazapine and Lorazepam.  

## 2017-01-04 NOTE — Assessment & Plan Note (Signed)
Controlled, not taking meds.  

## 2017-01-12 LAB — HEPATIC FUNCTION PANEL
ALK PHOS: 56 U/L (ref 25–125)
ALT: 10 U/L (ref 7–35)
AST: 13 U/L (ref 13–35)
Bilirubin, Total: 0.5 mg/dL

## 2017-01-12 LAB — BASIC METABOLIC PANEL
BUN: 27 mg/dL — AB (ref 4–21)
Creatinine: 1.1 mg/dL (ref <=1.1)
Glucose: 84 mg/dL
POTASSIUM: 4.3 mmol/L (ref 3.4–5.3)
Sodium: 141 mmol/L (ref 137–147)

## 2017-01-12 LAB — CBC AND DIFFERENTIAL
HCT: 34 % — AB (ref 36–46)
HEMOGLOBIN: 11 g/dL — AB (ref 12.0–16.0)
Platelets: 191 10*3/uL (ref 150–399)
WBC: 3.7 10*3/mL

## 2017-01-13 ENCOUNTER — Other Ambulatory Visit: Payer: Self-pay | Admitting: *Deleted

## 2017-01-18 ENCOUNTER — Encounter: Payer: Self-pay | Admitting: Nurse Practitioner

## 2017-01-18 NOTE — Progress Notes (Signed)
This encounter was created in error - please disregard.

## 2017-02-08 ENCOUNTER — Non-Acute Institutional Stay (SKILLED_NURSING_FACILITY): Payer: Medicare Other | Admitting: Internal Medicine

## 2017-02-08 ENCOUNTER — Encounter: Payer: Self-pay | Admitting: Internal Medicine

## 2017-02-08 DIAGNOSIS — N181 Chronic kidney disease, stage 1: Secondary | ICD-10-CM

## 2017-02-08 DIAGNOSIS — F0280 Dementia in other diseases classified elsewhere without behavioral disturbance: Secondary | ICD-10-CM | POA: Diagnosis not present

## 2017-02-08 DIAGNOSIS — D631 Anemia in chronic kidney disease: Secondary | ICD-10-CM

## 2017-02-08 DIAGNOSIS — I10 Essential (primary) hypertension: Secondary | ICD-10-CM | POA: Diagnosis not present

## 2017-02-08 DIAGNOSIS — G301 Alzheimer's disease with late onset: Secondary | ICD-10-CM | POA: Diagnosis not present

## 2017-02-08 NOTE — Progress Notes (Signed)
Progress Note    Location:  [facility] Room Number: N55 Place of Service:  SNF [phone]) Provider:  Jeanmarie Hubert, MD  Patient Care Team: Estill Dooms, MD as PCP - General (Internal Medicine) [place] Minus Breeding, MD as Consulting Physician (Cardiology) R Crecencio Mc, MD as Consulting Physician (Ophthalmology) Leta Baptist, MD as Consulting Physician (Otolaryngology) Man Otho Darner, NP as Nurse Practitioner (Internal Medicine)  Extended Emergency Contact Information Primary Emergency Contact: McGreggor,Wynn  Johnnette Litter of [place] Phone: [phone] Mobile Phone: [phone] Relation: Friend Secondary Emergency Contact: Daniel,Gloria Address: [place], [place] of [place] Phone: [phone] Mobile Phone: [phone] Relation: Friend  Code Status:  DNR Goals of care: Advanced Directive information Advanced Directives 02/08/2017  Does Patient Have a Medical Advance Directive? Yes  Type of Paramedic of [place];Living will;Out of facility DNR (pink MOST or yellow form)  Does patient want to make changes to medical advance directive? -  Copy of [place] in Chart? Yes  Pre-existing out of facility DNR order (yellow form or pink MOST form) Yellow form placed in chart (order not valid for inpatient use)     Chief Complaint  Patient presents with  . Medical Management of Chronic Issues    routine visit    HPI:  Pt is a 83 y.o. female seen today for medical management of chronic diseases.    Late onset Alzheimer's disease without behavioral disturbance - unchanged  Anemia in stage 1 chronic kidney disease - stable to slightly improved  Stage 1 chronic kidney disease - stable  Essential hypertension - controlled     Past Medical History:  Diagnosis Date  . Alzheimer's disease 01/10/2015   MMSE 25/30  . Anxiety    mild  . Anxiety  01/11/2014  . Aortic stenosis    a. Echo 03/2010:  EF 70-75%, mild AS  . Cerebral atrophy 01/10/2015  . Cerebrovascular disease 11/01/2005   Small vessel disease  . Chronic neck pain 08/06/2011  . Closed pelvic fracture ([place]) 09/29/12   left nondisplaced inferior pelvic ramus  . Glaucoma    "both eyes; no vision in left eye as a result" (04/06/2013)  . History of recurrent UTIs   . Hypertension   . Osteoarthritis 08/06/2011  . Pneumonia 04/06/2013   "for the first time" (04/06/2013)  . PVC (premature ventricular contraction)   . Senile osteoporosis 04/08/2011  . SVT (supraventricular tachycardia) ([place])   . Thumb pain 08/06/2011  . Vision loss of left eye    "can't see at all out of it" (04/06/2013)   Past Surgical History:  Procedure Laterality Date  . APPENDECTOMY    . CATARACT EXTRACTION, BILATERAL Bilateral   . GLAUCOMA SURGERY Left    "put in stent for glaucoma" (04/06/2013)  . TONSILLECTOMY      Allergies  Allergen Reactions  . Codeine Nausea Only  . Sulfate     Allergies as of 02/08/2017      Reactions   Codeine Nausea Only   Sulfate       Medication List       Accurate as of 02/08/17 12:14 PM. Always use your most recent med list.          acetaminophen 325 MG tablet Commonly known as:  TYLENOL Take 650 mg by mouth every 4 (four) hours as needed.   Calcium Citrate-Vitamin D 315-250 MG-UNIT  Tabs Take by mouth. Take one tablet daily   donepezil 5 MG tablet Commonly known as:  ARICEPT One daily to help preserve memory   feeding supplement Liqd Take 1 Container by mouth. Drink 4 oz twice daily   magnesium hydroxide 400 MG/5ML suspension Commonly known as:  MILK OF MAGNESIA Take 30 mLs by mouth at bedtime. for constipation   polyethylene glycol packet Commonly known as:  MIRALAX / GLYCOLAX Take 17 g by mouth daily as needed for mild constipation.   polyvinyl alcohol 1.4 % ophthalmic solution Commonly known as:  LIQUIFILM TEARS Place 1 drop into both eyes 4  (four) times daily.   vitamin C 500 MG tablet Commonly known as:  ASCORBIC ACID Take 500 mg by mouth daily.       Review of Systems  Constitutional: Negative for chills, diaphoresis and fever.  HENT: Positive for hearing loss. Negative for congestion, ear discharge, ear pain, nosebleeds and tinnitus.   Eyes: Negative for photophobia, pain and discharge.       Poor vision and glaucoma. Left eye has no vision  Respiratory: Negative.  Negative for cough, shortness of breath and wheezing.   Cardiovascular: Negative for chest pain, palpitations and leg swelling.  Gastrointestinal: Positive for constipation. Negative for abdominal pain, blood in stool, diarrhea, nausea and vomiting.  Genitourinary: Positive for frequency. Negative for dysuria, hematuria and urgency.  Musculoskeletal: Negative for back pain, myalgias and neck pain.       The right shoulder pain with overhead ROM  Skin: Negative.  Negative for rash.  Neurological: Negative for dizziness, tremors and weakness.       Forgetful.  Hematological: Does not bruise/bleed easily.  Psychiatric/Behavioral: Positive for confusion and decreased concentration. Negative for hallucinations and suicidal ideas. The patient is nervous/anxious.     Immunization History  Administered Date(s) Administered  . Influenza Split 09/22/2011, 09/13/2012  . Influenza-Unspecified 08/09/2013, 08/27/2014, 08/08/2015, 08/25/2016  . Tdap 09/13/2012   Pertinent  Health Maintenance Due  Topic Date Due  . DEXA SCAN  03/03/1984  . PNA vac Low Risk Adult (1 of 2 - PCV13) 03/03/1984  . INFLUENZA VACCINE  06/09/2017   Fall Risk  03/21/2015 08/23/2014 09/29/2012 09/29/2012  Falls in the past year? No No No No     Vitals:   02/08/17 1206  BP: 122/72  Pulse: 70  Resp: 18  Temp: 98.5 F (36.9 C)  Weight: 129 lb 12.8 oz (58.9 kg)  Height: 5\' 4"  (1.626 m)   Body mass index is 22.28 kg/m. Physical Exam  Constitutional: She is oriented to person,  place, and time. She appears well-developed and well-nourished. No distress.  HENT:  Head: Normocephalic and atraumatic.  Right Ear: External ear normal.  Left Ear: External ear normal.  Eyes: Conjunctivae and EOM are normal. Pupils are equal, round, and reactive to light.  Corrective lenses. Poor vision. Can only see light from left eye.  Neck: Neck supple. No JVD present. No tracheal deviation present. No thyromegaly present.  Cardiovascular: Normal rate, regular rhythm and intact distal pulses.  Exam reveals no gallop and no friction rub.   Murmur (2/6 aortic ejection murmur) heard. Pulmonary/Chest: Breath sounds normal. No respiratory distress. She has no wheezes. She exhibits no tenderness.  Abdominal: Soft. Bowel sounds are normal. She exhibits no distension. There is no tenderness.  Musculoskeletal: She exhibits no edema or tenderness.  Unstable gait. Using walker. The right shoulder reduced overhead ROM  Neurological: She is alert and oriented to person, place,  and time. She has normal reflexes. No cranial nerve deficit. Coordination normal.  memory deficit 01/10/15 MMSE 25/30. Passed clock drawing.  Skin: No rash noted. No erythema. No pallor.  Psychiatric: She has a normal mood and affect. Her behavior is normal. Thought content normal.  Lots of anxiety    Labs reviewed:  Recent Labs  06/11/16 01/12/17  NA 142 141  K 4.4 4.3  BUN 23* 27*  CREATININE 1.1 1.1    Recent Labs  06/11/16 01/12/17  AST 16 13  ALT 12 10  ALKPHOS 65 56    Recent Labs  06/11/16 01/12/17  WBC 3.2 3.7  HGB 10.7* 11.0*  HCT 32* 34*  PLT 171 191   Lab Results  Component Value Date   TSH 2.28 06/11/2016   No results found for: HGBA1C Lab Results  Component Value Date   CHOL 207 (A) 08/28/2014   HDL 80 (A) 08/28/2014   LDLCALC 113 08/28/2014   TRIG 70 08/28/2014    Assessment/Plan  1. Late onset Alzheimer's disease without behavioral disturbance Stable on donepezil.  2. Anemia  in stage 1 chronic kidney disease Continue to monitor  3. Stage 1 chronic kidney disease Continue to monitor  4. Essential hypertension Controlled without medications

## 2017-03-10 ENCOUNTER — Encounter: Payer: Self-pay | Admitting: Nurse Practitioner

## 2017-03-10 ENCOUNTER — Non-Acute Institutional Stay (SKILLED_NURSING_FACILITY): Payer: Medicare Other | Admitting: Nurse Practitioner

## 2017-03-10 DIAGNOSIS — N318 Other neuromuscular dysfunction of bladder: Secondary | ICD-10-CM | POA: Diagnosis not present

## 2017-03-10 DIAGNOSIS — F0280 Dementia in other diseases classified elsewhere without behavioral disturbance: Secondary | ICD-10-CM | POA: Diagnosis not present

## 2017-03-10 DIAGNOSIS — F411 Generalized anxiety disorder: Secondary | ICD-10-CM | POA: Diagnosis not present

## 2017-03-10 DIAGNOSIS — M159 Polyosteoarthritis, unspecified: Secondary | ICD-10-CM

## 2017-03-10 DIAGNOSIS — M150 Primary generalized (osteo)arthritis: Secondary | ICD-10-CM | POA: Diagnosis not present

## 2017-03-10 DIAGNOSIS — D631 Anemia in chronic kidney disease: Secondary | ICD-10-CM | POA: Diagnosis not present

## 2017-03-10 DIAGNOSIS — I10 Essential (primary) hypertension: Secondary | ICD-10-CM | POA: Diagnosis not present

## 2017-03-10 DIAGNOSIS — N181 Chronic kidney disease, stage 1: Secondary | ICD-10-CM

## 2017-03-10 DIAGNOSIS — K5900 Constipation, unspecified: Secondary | ICD-10-CM | POA: Diagnosis not present

## 2017-03-10 DIAGNOSIS — I471 Supraventricular tachycardia: Secondary | ICD-10-CM | POA: Diagnosis not present

## 2017-03-10 DIAGNOSIS — G301 Alzheimer's disease with late onset: Secondary | ICD-10-CM | POA: Diagnosis not present

## 2017-03-10 NOTE — Assessment & Plan Note (Signed)
Stable, continue  MiraLax prn

## 2017-03-10 NOTE — Assessment & Plan Note (Signed)
Stable, off Mirtazapine and Lorazepam.

## 2017-03-10 NOTE — Assessment & Plan Note (Signed)
Continue SNF for care needs, intensive supervision for safety, fall risk. Multiple sites OA, ambulates with walker, continue prn Tylenol.

## 2017-03-10 NOTE — Assessment & Plan Note (Signed)
Creat 1.13 01/12/17

## 2017-03-10 NOTE — Assessment & Plan Note (Signed)
Heart rate is in controlled, off Metoprolol.

## 2017-03-10 NOTE — Assessment & Plan Note (Signed)
Managed with adult brief, not on med

## 2017-03-10 NOTE — Assessment & Plan Note (Addendum)
Controlled, not taking meds, 01/12/17 Na 141, K 4.3, Bun 27, creat 1.13, TSH 2.01, wbc 3.7, Hgb 11.0, plt 191

## 2017-03-10 NOTE — Assessment & Plan Note (Signed)
Continue to decline cognitively, adjusted to SNF, off  Aricept.

## 2017-03-10 NOTE — Progress Notes (Signed)
Location:  [facility] Room Number: 66 Place of Service:  SNF (31) Provider:  Jisella Ashenfelter, Manxie  NP  Jeanmarie Hubert, MD  Patient Care Team: Estill Dooms, MD as PCP - General (Internal Medicine) [place] Minus Breeding, MD as Consulting Physician (Cardiology) R Crecencio Mc, MD as Consulting Physician (Ophthalmology) Leta Baptist, MD as Consulting Physician (Otolaryngology) [place] Otho Darner, NP as Nurse Practitioner (Internal Medicine)  Extended Emergency Contact Information Primary Emergency Contact: Vinson Moselle of [place] Phone: [phone] Mobile Phone: [phone] Relation: Friend Secondary Emergency Contact: Daniel,Gloria Address: [place], [place] of [place] Phone: [phone] Mobile Phone: [phone] Relation: Friend  Code Status:  DNR Goals of care: Advanced Directive information Advanced Directives 03/10/2017  Does Patient Have a Medical Advance Directive? Yes  Type of Paramedic of [place];Living will;Out of facility DNR (pink MOST or yellow form)  Does patient want to make changes to medical advance directive? No - Patient declined  Copy of [place] in Chart? Yes  Pre-existing out of facility DNR order (yellow form or pink MOST form) Yellow form placed in chart (order not valid for inpatient use)     Chief Complaint  Patient presents with  . Medical Management of Chronic Issues    HPI:  Pt is a 83 y.o. female seen today for evaluation of chronic medical conditions.     Hx of dementia, off Aricept presently. She takes prnMiraLax for constipation, stable anxiety/sleep, offMirtazapine 7.5mg  hs since 07/31/16. Arthritic pain, mainly in the right shoulder injured from falling 12/2014, healed with reduced ROM, Tylenol prn available to her.    Past Medical History:  Diagnosis Date  . Alzheimer's disease 01/10/2015   MMSE 25/30   . Anxiety    mild  . Anxiety 01/11/2014  . Aortic stenosis    a. Echo 03/2010:  EF 70-75%, mild AS  . Cerebral atrophy 01/10/2015  . Cerebrovascular disease 11/01/2005   Small vessel disease  . Chronic neck pain 08/06/2011  . Closed pelvic fracture ([place]) 09/29/12   left nondisplaced inferior pelvic ramus  . Glaucoma    "both eyes; no vision in left eye as a result" (04/06/2013)  . History of recurrent UTIs   . Hypertension   . Osteoarthritis 08/06/2011  . Pneumonia 04/06/2013   "for the first time" (04/06/2013)  . PVC (premature ventricular contraction)   . Senile osteoporosis 04/08/2011  . SVT (supraventricular tachycardia) ([place])   . Thumb pain 08/06/2011  . Vision loss of left eye    "can't see at all out of it" (04/06/2013)   Past Surgical History:  Procedure Laterality Date  . APPENDECTOMY    . CATARACT EXTRACTION, BILATERAL Bilateral   . GLAUCOMA SURGERY Left    "put in stent for glaucoma" (04/06/2013)  . TONSILLECTOMY      Allergies  Allergen Reactions  . Codeine Nausea Only  . Sulfate     Outpatient Encounter Prescriptions as of 03/10/2017  Medication Sig  . acetaminophen (TYLENOL) 325 MG tablet Take 650 mg by mouth every 4 (four) hours as needed.   . Calcium Citrate-Vitamin D 315-250 MG-UNIT TABS Take by mouth. Take one tablet daily  . polyethylene glycol (MIRALAX / GLYCOLAX) packet Take 17 g by mouth daily as needed for mild constipation.  . polyvinyl alcohol (LIQUIFILM TEARS) 1.4 % ophthalmic solution Place 1 drop into both eyes 4 (four) times  daily.  . vitamin C (ASCORBIC ACID) 500 MG tablet Take 500 mg by mouth daily.  . [DISCONTINUED] donepezil (ARICEPT) 5 MG tablet One daily to help preserve memory  . [DISCONTINUED] feeding supplement (BOOST / RESOURCE BREEZE) LIQD Take 1 Container by mouth. Drink 4 oz twice daily  . [DISCONTINUED] magnesium hydroxide (MILK OF MAGNESIA) 400 MG/5ML suspension Take 30 mLs by mouth at bedtime. for constipation   No  facility-administered encounter medications on file as of 03/10/2017.     Review of Systems  Constitutional: Negative for chills, diaphoresis and fever.  HENT: Positive for hearing loss. Negative for congestion, ear discharge, ear pain, nosebleeds and tinnitus.   Eyes: Negative for photophobia, pain and discharge.       Poor vision and glaucoma. Left eye has no vision  Respiratory: Negative.  Negative for cough, shortness of breath and wheezing.   Cardiovascular: Negative for chest pain, palpitations and leg swelling.  Gastrointestinal: Positive for constipation. Negative for abdominal pain, blood in stool, diarrhea, nausea and vomiting.  Genitourinary: Positive for frequency. Negative for dysuria, hematuria and urgency.  Musculoskeletal: Negative for back pain, myalgias and neck pain.       The right shoulder pain with overhead ROM  Skin: Negative.  Negative for rash.  Neurological: Negative for dizziness, tremors and weakness.       Forgetful.  Hematological: Does not bruise/bleed easily.  Psychiatric/Behavioral: Positive for confusion and decreased concentration. Negative for hallucinations and suicidal ideas. The patient is nervous/anxious.     Immunization History  Administered Date(s) Administered  . Influenza Split 09/22/2011, 09/13/2012  . Influenza-Unspecified 08/09/2013, 08/27/2014, 08/08/2015, 08/25/2016  . Tdap 09/13/2012   Pertinent  Health Maintenance Due  Topic Date Due  . DEXA SCAN  03/03/1984  . PNA vac Low Risk Adult (1 of 2 - PCV13) 03/03/1984  . INFLUENZA VACCINE  06/09/2017   Fall Risk  03/21/2015 08/23/2014 09/29/2012 09/29/2012  Falls in the past year? No No No No   Functional Status Survey:    Vitals:   03/10/17 0941  BP: 122/60  Pulse: 68  Resp: 20  Temp: 98.1 F (36.7 C)  Weight: 105 lb 4.8 oz (47.8 kg)  Height: 5\' 4"  (1.626 m)   Body mass index is 18.07 kg/m. Physical Exam  Constitutional: She is oriented to person, place, and time. She  appears well-developed and well-nourished. No distress.  HENT:  Head: Normocephalic and atraumatic.  Right Ear: External ear normal.  Left Ear: External ear normal.  Eyes: Conjunctivae and EOM are normal. Pupils are equal, round, and reactive to light.  Corrective lenses. Poor vision. Can only see light from left eye.  Neck: Neck supple. No JVD present. No tracheal deviation present. No thyromegaly present.  Cardiovascular: Normal rate, regular rhythm and intact distal pulses.  Exam reveals no gallop and no friction rub.   Murmur (2/6 aortic ejection murmur) heard. Pulmonary/Chest: Breath sounds normal. No respiratory distress. She has no wheezes. She exhibits no tenderness.  Abdominal: Soft. Bowel sounds are normal. She exhibits no distension. There is no tenderness.  Musculoskeletal: She exhibits no edema or tenderness.  Unstable gait. Using walker. The right shoulder reduced overhead ROM  Neurological: She is alert and oriented to person, place, and time. She has normal reflexes. No cranial nerve deficit. Coordination normal.  memory deficit 01/10/15 MMSE 25/30. Passed clock drawing.  Skin: No rash noted. No erythema. No pallor.  Psychiatric: She has a normal mood and affect. Her behavior is normal. Thought content normal.  Lots of anxiety    Labs reviewed:  Recent Labs  06/11/16 01/12/17  NA 142 141  K 4.4 4.3  BUN 23* 27*  CREATININE 1.1 1.1    Recent Labs  06/11/16 01/12/17  AST 16 13  ALT 12 10  ALKPHOS 65 56    Recent Labs  06/11/16 01/12/17  WBC 3.2 3.7  HGB 10.7* 11.0*  HCT 32* 34*  PLT 171 191   Lab Results  Component Value Date   TSH 2.28 06/11/2016   No results found for: HGBA1C Lab Results  Component Value Date   CHOL 207 (A) 08/28/2014   HDL 80 (A) 08/28/2014   LDLCALC 113 08/28/2014   TRIG 70 08/28/2014    Significant Diagnostic Results in last 30 days:  No results found.  Assessment/Plan HTN (hypertension) Controlled, not taking meds,  01/12/17 Na 141, K 4.3, Bun 27, creat 1.13, TSH 2.01, wbc 3.7, Hgb 11.0, plt 191    SVT (supraventricular tachycardia) Heart rate is in controlled, off Metoprolol.   Constipation Stable, continue  MiraLax prn  Alzheimer's disease Continue to decline cognitively, adjusted to SNF, off  Aricept.  Osteoarthritis Continue SNF for care needs, intensive supervision for safety, fall risk. Multiple sites OA, ambulates with walker, continue prn Tylenol.   Anemia in chronic kidney disease Stable, Hgb 11.0 01/12/17  Chronic kidney disease Creat 1.13 01/12/17  Hypertonicity of bladder Managed with adult brief, not on med  Anxiety state Stable, off Mirtazapine and Lorazepam.      Family/ staff Communication: SNF  Labs/tests ordered:  none

## 2017-03-10 NOTE — Assessment & Plan Note (Signed)
Stable, Hgb 11.0 01/12/17

## 2017-04-01 ENCOUNTER — Non-Acute Institutional Stay (SKILLED_NURSING_FACILITY): Payer: Medicare Other | Admitting: Nurse Practitioner

## 2017-04-01 ENCOUNTER — Encounter: Payer: Self-pay | Admitting: Nurse Practitioner

## 2017-04-01 DIAGNOSIS — L309 Dermatitis, unspecified: Secondary | ICD-10-CM | POA: Diagnosis not present

## 2017-04-01 DIAGNOSIS — K5900 Constipation, unspecified: Secondary | ICD-10-CM

## 2017-04-01 DIAGNOSIS — M159 Polyosteoarthritis, unspecified: Secondary | ICD-10-CM

## 2017-04-01 DIAGNOSIS — N318 Other neuromuscular dysfunction of bladder: Secondary | ICD-10-CM | POA: Diagnosis not present

## 2017-04-01 DIAGNOSIS — F0280 Dementia in other diseases classified elsewhere without behavioral disturbance: Secondary | ICD-10-CM

## 2017-04-01 DIAGNOSIS — M150 Primary generalized (osteo)arthritis: Secondary | ICD-10-CM | POA: Diagnosis not present

## 2017-04-01 DIAGNOSIS — G301 Alzheimer's disease with late onset: Secondary | ICD-10-CM

## 2017-04-01 DIAGNOSIS — D631 Anemia in chronic kidney disease: Secondary | ICD-10-CM | POA: Diagnosis not present

## 2017-04-01 DIAGNOSIS — I10 Essential (primary) hypertension: Secondary | ICD-10-CM | POA: Diagnosis not present

## 2017-04-01 DIAGNOSIS — N181 Chronic kidney disease, stage 1: Secondary | ICD-10-CM

## 2017-04-01 NOTE — Progress Notes (Signed)
Location:  [facility] Room Number: 58 Place of Service:  SNF (31) Provider:  Jakylan Ron, Manxie  NP  Estill Dooms, MD  Patient Care Team: Estill Dooms, MD as PCP - General (Internal Medicine) [facility] Minus Breeding, MD as Consulting Physician (Cardiology) Nathanial Rancher, MD as Consulting Physician (Ophthalmology) Leta Baptist, MD as Consulting Physician (Otolaryngology) Milly Goggins X, NP as Nurse Practitioner (Internal Medicine)  Extended Emergency Contact Information Primary Emergency Contact: Vinson Moselle of [place] Phone: [phone] Mobile Phone: [phone] Relation: Friend Secondary Emergency Contact: Daniel,Gloria Address: [place], [place] of [place] Phone: [phone] Mobile Phone: [phone] Relation: Friend  Code Status:  DNR Goals of care: Advanced Directive information Advanced Directives 04/01/2017  Does Patient Have a Medical Advance Directive? Yes  Type of Paramedic of [place];Living will;Out of facility DNR (pink MOST or yellow form)  Does patient want to make changes to medical advance directive? No - Patient declined  Copy of [place] in Chart? Yes  Pre-existing out of facility DNR order (yellow form or pink MOST form) Yellow form placed in chart (order not valid for inpatient use)     Chief Complaint  Patient presents with  . Acute Visit    (R) lateral arm, redness, swollen slightly, itching    HPI:  Pt is a 83 y.o. female seen today for c/o right elbow area itching, mild erythema and swelling, denied pain, duration is uncertain, denied recent change of bedding, clothing, soap, lotion, shampoo, or laundry detergent    Hx of dementia, off Aricept presently. She takes prnMiraLax for constipation, stable anxiety/sleep, offMirtazapine 7.5mg  hs since 07/31/16. Arthritic pain, mainly in the right  shoulder injured from falling 12/2014, healed with reduced ROM, Tylenol prn available to her.    Past Medical History:  Diagnosis Date  . Alzheimer's disease 01/10/2015   MMSE 25/30  . Anxiety    mild  . Anxiety 01/11/2014  . Aortic stenosis    a. Echo 03/2010:  EF 70-75%, mild AS  . Cerebral atrophy 01/10/2015  . Cerebrovascular disease 11/01/2005   Small vessel disease  . Chronic neck pain 08/06/2011  . Closed pelvic fracture ([place]) 09/29/12   left nondisplaced inferior pelvic ramus  . Glaucoma    "both eyes; no vision in left eye as a result" (04/06/2013)  . History of recurrent UTIs   . Hypertension   . Osteoarthritis 08/06/2011  . Pneumonia 04/06/2013   "for the first time" (04/06/2013)  . PVC (premature ventricular contraction)   . Senile osteoporosis 04/08/2011  . SVT (supraventricular tachycardia) ([place])   . Thumb pain 08/06/2011  . Vision loss of left eye    "can't see at all out of it" (04/06/2013)   Past Surgical History:  Procedure Laterality Date  . APPENDECTOMY    . CATARACT EXTRACTION, BILATERAL Bilateral   . GLAUCOMA SURGERY Left    "put in stent for glaucoma" (04/06/2013)  . TONSILLECTOMY      Allergies  Allergen Reactions  . Codeine Nausea Only  . Sulfate     Outpatient Encounter Prescriptions as of 04/01/2017  Medication Sig  . acetaminophen (TYLENOL) 325 MG tablet Take 650 mg by mouth every 4 (four) hours as needed.   . Calcium Citrate-Vitamin D 315-250 MG-UNIT TABS Take by mouth. Take one tablet daily  . donepezil (ARICEPT) 5 MG tablet Take 5 mg  by mouth at bedtime.  . memantine (NAMENDA) 10 MG tablet Take 10 mg by mouth 2 (two) times daily.  . polyethylene glycol (MIRALAX / GLYCOLAX) packet Take 17 g by mouth daily as needed for mild constipation.  . polyvinyl alcohol (LIQUIFILM TEARS) 1.4 % ophthalmic solution Place 1 drop into both eyes 4 (four) times daily.  . vitamin C (ASCORBIC ACID) 500 MG tablet Take 500 mg by mouth daily.   No facility-administered  encounter medications on file as of 04/01/2017.     Review of Systems  Constitutional: Negative for chills, diaphoresis and fever.  HENT: Positive for hearing loss. Negative for congestion, ear discharge, ear pain, nosebleeds and tinnitus.   Eyes: Negative for photophobia, pain and discharge.       Poor vision and glaucoma. Left eye has no vision  Respiratory: Negative.  Negative for cough, shortness of breath and wheezing.   Cardiovascular: Negative for chest pain, palpitations and leg swelling.  Gastrointestinal: Positive for constipation. Negative for abdominal pain, blood in stool, diarrhea, nausea and vomiting.  Genitourinary: Positive for frequency. Negative for dysuria, hematuria and urgency.  Musculoskeletal: Negative for back pain, myalgias and neck pain.       The right shoulder pain with overhead ROM  Skin: Positive for rash.       c/o right elbow area,  itching, mild erythema and swelling, denied pain, duration is uncertain, denied recent change of bedding, clothing, soap, lotion, shampoo, or laundry detergent   Neurological: Negative for dizziness, tremors and weakness.       Forgetful.  Hematological: Does not bruise/bleed easily.  Psychiatric/Behavioral: Positive for confusion and decreased concentration. Negative for hallucinations and suicidal ideas. The patient is nervous/anxious.     Immunization History  Administered Date(s) Administered  . Influenza Split 09/22/2011, 09/13/2012  . Influenza-Unspecified 08/09/2013, 08/27/2014, 08/08/2015, 08/25/2016  . Tdap 09/13/2012   Pertinent  Health Maintenance Due  Topic Date Due  . DEXA SCAN  03/03/1984  . PNA vac Low Risk Adult (1 of 2 - PCV13) 03/03/1984  . INFLUENZA VACCINE  06/09/2017   Fall Risk  03/21/2015 08/23/2014 09/29/2012 09/29/2012  Falls in the past year? No No No No   Functional Status Survey:    Vitals:   04/01/17 1230  BP: 122/64  Pulse: 76  Resp: 20  Temp: 98.1 F (36.7 C)  Weight: 107 lb  (48.5 kg)  Height: 5\' 4"  (1.626 m)   Body mass index is 18.37 kg/m. Physical Exam  Constitutional: She is oriented to person, place, and time. She appears well-developed and well-nourished. No distress.  HENT:  Head: Normocephalic and atraumatic.  Right Ear: External ear normal.  Left Ear: External ear normal.  Eyes: Conjunctivae and EOM are normal. Pupils are equal, round, and reactive to light.  Corrective lenses. Poor vision. Can only see light from left eye.  Neck: Neck supple. No JVD present. No tracheal deviation present. No thyromegaly present.  Cardiovascular: Normal rate, regular rhythm and intact distal pulses.  Exam reveals no gallop and no friction rub.   Murmur (2/6 aortic ejection murmur) heard. Pulmonary/Chest: Breath sounds normal. No respiratory distress. She has no wheezes. She exhibits no tenderness.  Abdominal: Soft. Bowel sounds are normal. She exhibits no distension. There is no tenderness.  Musculoskeletal: She exhibits no edema or tenderness.  Unstable gait. Using walker. The right shoulder reduced overhead ROM  Neurological: She is alert and oriented to person, place, and time. She has normal reflexes. No cranial nerve deficit. Coordination normal.  memory deficit 01/10/15 MMSE 25/30. Passed clock drawing.  Skin: Rash noted. There is erythema. No pallor.  c/o right arm elbow area itching, mild erythema and swelling, denied pain, duration is uncertain, denied recent change of bedding, clothing, soap, lotion, shampoo, or laundry detergent   Psychiatric: She has a normal mood and affect. Her behavior is normal. Thought content normal.  Lots of anxiety    Labs reviewed:  Recent Labs  06/11/16 01/12/17  NA 142 141  K 4.4 4.3  BUN 23* 27*  CREATININE 1.1 1.1    Recent Labs  06/11/16 01/12/17  AST 16 13  ALT 12 10  ALKPHOS 65 56    Recent Labs  06/11/16 01/12/17  WBC 3.2 3.7  HGB 10.7* 11.0*  HCT 32* 34*  PLT 171 191   Lab Results  Component  Value Date   TSH 2.28 06/11/2016   No results found for: HGBA1C Lab Results  Component Value Date   CHOL 207 (A) 08/28/2014   HDL 80 (A) 08/28/2014   LDLCALC 113 08/28/2014   TRIG 70 08/28/2014    Significant Diagnostic Results in last 30 days:  No results found.  Assessment/Plan Dermatitis c/o right arm mild itching, swelling, denied pain, duration is uncertain, denied recent change of bedding, clothing, soap, lotion, shampoo, or laundry detergent Will apply 1% Hydrocortisone cream bid to affected area. Observe.   HTN (hypertension) Controlled, not taking meds, 01/12/17 Na 141, K 4.3, Bun 27, creat 1.13, TSH 2.01, wbc 3.7, Hgb 11.0, plt 191  Constipation Stable, continue  MiraLax prn  Alzheimer's disease Continue to decline cognitively, adjusted to SNF, off  Aricept.  Osteoarthritis Continue SNF for care needs, intensive supervision for safety, fall risk. Multiple sites OA, ambulates with walker, continue prn Tylenol.   Anemia in chronic kidney disease Stable, Hgb 11.0 01/12/17  Chronic kidney disease Creat 1.13 01/12/17  Hypertonicity of bladder Managed with adult brief, not on med     Family/ staff Communication: SNF  Labs/tests ordered:  none

## 2017-04-01 NOTE — Assessment & Plan Note (Signed)
c/o right arm mild itching, swelling, denied pain, duration is uncertain, denied recent change of bedding, clothing, soap, lotion, shampoo, or laundry detergent Will apply 1% Hydrocortisone cream bid to affected area. Observe.

## 2017-04-01 NOTE — Assessment & Plan Note (Signed)
Creat 1.13 01/12/17

## 2017-04-01 NOTE — Assessment & Plan Note (Signed)
Managed with adult brief, not on med

## 2017-04-01 NOTE — Assessment & Plan Note (Signed)
Stable, continue  MiraLax prn

## 2017-04-01 NOTE — Assessment & Plan Note (Signed)
Controlled, not taking meds, 01/12/17 Na 141, K 4.3, Bun 27, creat 1.13, TSH 2.01, wbc 3.7, Hgb 11.0, plt 191

## 2017-04-01 NOTE — Assessment & Plan Note (Signed)
Continue SNF for care needs, intensive supervision for safety, fall risk. Multiple sites OA, ambulates with walker, continue prn Tylenol.

## 2017-04-01 NOTE — Progress Notes (Signed)
Location:  [facility] Room Number: 84 Place of Service:  SNF (31) Provider:  Mast, Manxie  NP   Estill Dooms, MD  Patient Care Team: Estill Dooms, MD as PCP - General (Internal Medicine) [facility] Minus Breeding, MD as Consulting Physician (Cardiology) Nathanial Rancher, MD as Consulting Physician (Ophthalmology) Leta Baptist, MD as Consulting Physician (Otolaryngology) Mast, Man X, NP as Nurse Practitioner (Internal Medicine)  Extended Emergency Contact Information Primary Emergency Contact: Vinson Moselle of [place] Phone: [phone] Mobile Phone: [phone] Relation: Friend Secondary Emergency Contact: Daniel,Gloria Address: [place], [place] of [place] Phone: [phone] Mobile Phone: [phone] Relation: Friend  Code Status: DNR Goals of care: Advanced Directive information Advanced Directives 04/01/2017  Does Patient Have a Medical Advance Directive? Yes  Type of Paramedic of [place];Living will;Out of facility DNR (pink MOST or yellow form)  Does patient want to make changes to medical advance directive? No - Patient declined  Copy of [place] in Chart? Yes  Pre-existing out of facility DNR order (yellow form or pink MOST form) Yellow form placed in chart (order not valid for inpatient use)     Chief Complaint  Patient presents with  . Acute Visit    (R) lateral arm, redness, swollen slightly, itching    HPI:  Pt is a 83 y.o. female seen today for an acute visit for    Past Medical History:  Diagnosis Date  . Alzheimer's disease 01/10/2015   MMSE 25/30  . Anxiety    mild  . Anxiety 01/11/2014  . Aortic stenosis    a. Echo 03/2010:  EF 70-75%, mild AS  . Cerebral atrophy 01/10/2015  . Cerebrovascular disease 11/01/2005   Small vessel disease  . Chronic neck pain 08/06/2011  . Closed pelvic fracture  ([place]) 09/29/12   left nondisplaced inferior pelvic ramus  . Glaucoma    "both eyes; no vision in left eye as a result" (04/06/2013)  . History of recurrent UTIs   . Hypertension   . Osteoarthritis 08/06/2011  . Pneumonia 04/06/2013   "for the first time" (04/06/2013)  . PVC (premature ventricular contraction)   . Senile osteoporosis 04/08/2011  . SVT (supraventricular tachycardia) ([place])   . Thumb pain 08/06/2011  . Vision loss of left eye    "can't see at all out of it" (04/06/2013)   Past Surgical History:  Procedure Laterality Date  . APPENDECTOMY    . CATARACT EXTRACTION, BILATERAL Bilateral   . GLAUCOMA SURGERY Left    "put in stent for glaucoma" (04/06/2013)  . TONSILLECTOMY      Allergies  Allergen Reactions  . Codeine Nausea Only  . Sulfate     Outpatient Encounter Prescriptions as of 04/01/2017  Medication Sig  . acetaminophen (TYLENOL) 325 MG tablet Take 650 mg by mouth every 4 (four) hours as needed.   . Calcium Citrate-Vitamin D 315-250 MG-UNIT TABS Take by mouth. Take one tablet daily  . donepezil (ARICEPT) 5 MG tablet Take 5 mg by mouth at bedtime.  . memantine (NAMENDA) 10 MG tablet Take 10 mg by mouth 2 (two) times daily.  . polyethylene glycol (MIRALAX / GLYCOLAX) packet Take 17 g by mouth daily as needed for mild constipation.  . polyvinyl alcohol (LIQUIFILM TEARS) 1.4 % ophthalmic solution Place 1 drop into both eyes 4 (four) times daily.  . vitamin C (  ASCORBIC ACID) 500 MG tablet Take 500 mg by mouth daily.   No facility-administered encounter medications on file as of 04/01/2017.     Review of Systems  Immunization History  Administered Date(s) Administered  . Influenza Split 09/22/2011, 09/13/2012  . Influenza-Unspecified 08/09/2013, 08/27/2014, 08/08/2015, 08/25/2016  . Tdap 09/13/2012   Pertinent  Health Maintenance Due  Topic Date Due  . DEXA SCAN  03/03/1984  . PNA vac Low Risk Adult (1 of 2 - PCV13) 03/03/1984  . INFLUENZA VACCINE  06/09/2017    Fall Risk  03/21/2015 08/23/2014 09/29/2012 09/29/2012  Falls in the past year? No No No No   Functional Status Survey:    Vitals:   04/01/17 1230  BP: 122/64  Pulse: 76  Resp: 20  Temp: 98.1 F (36.7 C)  Weight: 107 lb (48.5 kg)  Height: 5\' 4"  (1.626 m)   Body mass index is 18.37 kg/m. Physical Exam  Labs reviewed:  Recent Labs  06/11/16 01/12/17  NA 142 141  K 4.4 4.3  BUN 23* 27*  CREATININE 1.1 1.1    Recent Labs  06/11/16 01/12/17  AST 16 13  ALT 12 10  ALKPHOS 65 56    Recent Labs  06/11/16 01/12/17  WBC 3.2 3.7  HGB 10.7* 11.0*  HCT 32* 34*  PLT 171 191   Lab Results  Component Value Date   TSH 2.28 06/11/2016   No results found for: HGBA1C Lab Results  Component Value Date   CHOL 207 (A) 08/28/2014   HDL 80 (A) 08/28/2014   LDLCALC 113 08/28/2014   TRIG 70 08/28/2014    Significant Diagnostic Results in last 30 days:  No results found.  Assessment/Plan 1. Dermatitis   2. Essential hypertension   3. Constipation, unspecified constipation type   4. Late onset Alzheimer's disease without behavioral disturbance   5. Primary osteoarthritis involving multiple joints   6. Anemia in stage 1 chronic kidney disease   7. Stage 1 chronic kidney disease   8. Hypertonicity of bladder     Family/ staff Communication:   Labs/tests ordered:

## 2017-04-01 NOTE — Assessment & Plan Note (Signed)
Continue to decline cognitively, adjusted to SNF, off  Aricept.

## 2017-04-01 NOTE — Assessment & Plan Note (Signed)
Stable, Hgb 11.0 01/12/17

## 2017-05-06 ENCOUNTER — Non-Acute Institutional Stay (SKILLED_NURSING_FACILITY): Payer: Medicare Other | Admitting: Internal Medicine

## 2017-05-06 ENCOUNTER — Encounter: Payer: Self-pay | Admitting: Internal Medicine

## 2017-05-06 DIAGNOSIS — Z8673 Personal history of transient ischemic attack (TIA), and cerebral infarction without residual deficits: Secondary | ICD-10-CM | POA: Diagnosis not present

## 2017-05-06 DIAGNOSIS — F0150 Vascular dementia without behavioral disturbance: Secondary | ICD-10-CM

## 2017-05-06 DIAGNOSIS — M150 Primary generalized (osteo)arthritis: Secondary | ICD-10-CM

## 2017-05-06 DIAGNOSIS — E46 Unspecified protein-calorie malnutrition: Secondary | ICD-10-CM | POA: Insufficient documentation

## 2017-05-06 DIAGNOSIS — E440 Moderate protein-calorie malnutrition: Secondary | ICD-10-CM

## 2017-05-06 DIAGNOSIS — D638 Anemia in other chronic diseases classified elsewhere: Secondary | ICD-10-CM

## 2017-05-06 DIAGNOSIS — G309 Alzheimer's disease, unspecified: Secondary | ICD-10-CM

## 2017-05-06 DIAGNOSIS — F0280 Dementia in other diseases classified elsewhere without behavioral disturbance: Secondary | ICD-10-CM | POA: Insufficient documentation

## 2017-05-06 DIAGNOSIS — M159 Polyosteoarthritis, unspecified: Secondary | ICD-10-CM

## 2017-05-06 DIAGNOSIS — K5909 Other constipation: Secondary | ICD-10-CM | POA: Diagnosis not present

## 2017-05-06 DIAGNOSIS — I10 Essential (primary) hypertension: Secondary | ICD-10-CM | POA: Diagnosis not present

## 2017-05-06 NOTE — Progress Notes (Signed)
Location:  [facility] Room Number: 62 Place of Service:  SNF [phone]) Provider:  Blanchie Serve MD  Blanchie Serve, MD  Patient Care Team: Blanchie Serve, MD as PCP - General (Internal Medicine) [facility] Minus Breeding, MD as Consulting Physician (Cardiology) Nathanial Rancher, MD as Consulting Physician (Ophthalmology) Leta Baptist, MD as Consulting Physician (Otolaryngology) Mast, Man X, NP as Nurse Practitioner (Internal Medicine)  Extended Emergency Contact Information Primary Emergency Contact: Vinson Moselle of [place] Phone: [phone] Mobile Phone: [phone] Relation: Friend Secondary Emergency Contact: Daniel,Gloria Address: [place], [place] of [place] Phone: [phone] Mobile Phone: [phone] Relation: Friend  Code Status:  DNR Goals of care: Advanced Directive information Advanced Directives 04/01/2017  Does Patient Have a Medical Advance Directive? Yes  Type of Paramedic of [place];Living will;Out of facility DNR (pink MOST or yellow form)  Does patient want to make changes to medical advance directive? No - Patient declined  Copy of [place] in Chart? Yes  Pre-existing out of facility DNR order (yellow form or pink MOST form) Yellow form placed in chart (order not valid for inpatient use)     Chief Complaint  Patient presents with  . Medical Management of Chronic Issues    Routine Visit     HPI:  Pt is a 83 y.o. female seen today for medical management of chronic diseases.  Staff have noticed her to be agitated and angry at times. She is pleasantly confused this visit. She feeds herself and takes her medications crushed in apple sauce. She gets around the facility with her walker. No fall reported.    Past Medical History:  Diagnosis Date  . Alzheimer's disease 01/10/2015   MMSE 25/30  . Anxiety    mild   . Anxiety 01/11/2014  . Aortic stenosis    a. Echo 03/2010:  EF 70-75%, mild AS  . Cerebral atrophy 01/10/2015  . Cerebrovascular disease 11/01/2005   Small vessel disease  . Chronic neck pain 08/06/2011  . Closed pelvic fracture ([facility]) 09/29/12   left nondisplaced inferior pelvic ramus  . Glaucoma    "both eyes; no vision in left eye as a result" (04/06/2013)  . History of recurrent UTIs   . Hypertension   . Osteoarthritis 08/06/2011  . Pneumonia 04/06/2013   "for the first time" (04/06/2013)  . PVC (premature ventricular contraction)   . Senile osteoporosis 04/08/2011  . SVT (supraventricular tachycardia) ([place])   . Thumb pain 08/06/2011  . Vision loss of left eye    "can't see at all out of it" (04/06/2013)   Past Surgical History:  Procedure Laterality Date  . APPENDECTOMY    . CATARACT EXTRACTION, BILATERAL Bilateral   . GLAUCOMA SURGERY Left    "put in stent for glaucoma" (04/06/2013)  . TONSILLECTOMY      Allergies  Allergen Reactions  . Codeine Nausea Only  . Sulfate     Outpatient Encounter Prescriptions as of 05/06/2017  Medication Sig  . acetaminophen (TYLENOL) 325 MG tablet Take 650 mg by mouth every 4 (four) hours as needed.   . Calcium Citrate-Vitamin D 315-250 MG-UNIT TABS Take 1 tablet by mouth daily.   Marland Kitchen donepezil (ARICEPT) 5 MG tablet Take 5 mg by mouth at bedtime.  . feeding supplement (BOOST / RESOURCE BREEZE) LIQD Take 1 Container by mouth 2 (two) times daily.  . magnesium hydroxide (  MILK OF MAGNESIA) 400 MG/5ML suspension Take 30 mLs by mouth at bedtime.  . memantine (NAMENDA) 10 MG tablet Take 10 mg by mouth 2 (two) times daily.  . polyethylene glycol (MIRALAX / GLYCOLAX) packet Take 17 g by mouth daily as needed for mild constipation.  . polyvinyl alcohol (LIQUIFILM TEARS) 1.4 % ophthalmic solution Place 1 drop into both eyes 4 (four) times daily as needed.   . [DISCONTINUED] vitamin C (ASCORBIC ACID) 500 MG tablet Take 500 mg by mouth daily.   No  facility-administered encounter medications on file as of 05/06/2017.     Review of Systems  Constitutional: Negative for appetite change, chills and fever.  HENT: Negative for congestion, rhinorrhea, sore throat and trouble swallowing.   Eyes: Negative for pain.       No vision to left eye, has glaucoma  Respiratory: Negative for cough, shortness of breath and wheezing.   Cardiovascular: Negative for chest pain, palpitations and leg swelling.  Gastrointestinal: Negative for abdominal pain, constipation, diarrhea, nausea and vomiting.  Genitourinary: Negative for dysuria.  Musculoskeletal: Negative for back pain.  Skin:       No pressure ulcer or wound reported  Neurological: Negative for tremors and seizures.       No fall reported  Psychiatric/Behavioral: Positive for agitation, behavioral problems and confusion.    Immunization History  Administered Date(s) Administered  . Influenza Split 09/22/2011, 09/13/2012  . Influenza-Unspecified 08/09/2013, 08/27/2014, 08/08/2015, 08/25/2016  . Tdap 09/13/2012   Pertinent  Health Maintenance Due  Topic Date Due  . PNA vac Low Risk Adult (1 of 2 - PCV13) 11/09/2017 (Originally 03/03/1984)  . DEXA SCAN  05/06/2018 (Originally 03/03/1984)  . INFLUENZA VACCINE  06/09/2017   Fall Risk  03/21/2015 08/23/2014 09/29/2012 09/29/2012  Falls in the past year? No No No No   Functional Status Survey:    Vitals:   05/06/17 1327  BP: (!) 154/70  Pulse: 72  Resp: 18  Temp: 98.1 F (36.7 C)  TempSrc: Oral  Weight: 107 lb 3.2 oz (48.6 kg)  Height: 5\' 4"  (1.626 m)   Body mass index is 18.4 kg/m. Physical Exam  Constitutional:  Frail, elderly, thin built, female, in no acute distress  HENT:  Head: Normocephalic and atraumatic.  Mouth/Throat: Oropharynx is clear and moist.  Eyes: Conjunctivae are normal.  Neck: Normal range of motion. Neck supple.  Cardiovascular: Normal rate and regular rhythm.   Murmur heard. systolic    Pulmonary/Chest: Effort normal and breath sounds normal. She has no wheezes. She has no rales.  Abdominal: Soft. Bowel sounds are normal.  Musculoskeletal: She exhibits no edema.  Can move all 4 extremities, gets aroud with her walker and needs minimal assist with transfers  Lymphadenopathy:    She has no cervical adenopathy.  Neurological: She is alert.  Oriented to person, place and year but not to month  Skin: Skin is warm and dry. No rash noted. She is not diaphoretic.  Psychiatric: She has a normal mood and affect.    Labs reviewed:  Recent Labs  06/11/16 01/12/17  NA 142 141  K 4.4 4.3  BUN 23* 27*  CREATININE 1.1 1.1    Recent Labs  06/11/16 01/12/17  AST 16 13  ALT 12 10  ALKPHOS 65 56    Recent Labs  06/11/16 01/12/17  WBC 3.2 3.7  HGB 10.7* 11.0*  HCT 32* 34*  PLT 171 191   Lab Results  Component Value Date   TSH 2.28 06/11/2016  No results found for: HGBA1C Lab Results  Component Value Date   CHOL 207 (A) 08/28/2014   HDL 80 (A) 08/28/2014   LDLCALC 113 08/28/2014   TRIG 70 08/28/2014    Significant Diagnostic Results in last 30 days:  No results found.  Assessment/Plan  Constipation Chronic, continue milk of magnesia and miralax on need basis, monitor  Mixed Dementia with behavioral disturbance Supportive care, continue donepezil 5 mg daily and namenda 10 mg bid. Monitor her behavior.   Protein calorie malnutrition Monitor po intake, with her dementia, decline anticipated  History of CVA Off medications, monitor clinically  Hypertension Slightly elevated SBP today. Off all medications. Monitor clinically  Anemia of chronic disease Monitor cbc periodically  OA Ambulates with walker, no fall reported, continue her tylenol and take fall precautions   Family/ staff Communication: reviewed care plan with patient and charge nurse.    Labs/tests ordered: none   Blanchie Serve, MD Internal Medicine [facility] [address] [place], [place] [address] Cell Phone (Monday-Friday 8 am - 5 pm): [phone] On Call: [phone] and follow prompts after 5 pm and on weekends Office Phone: [phone] Office Fax: [phone]

## 2017-06-03 ENCOUNTER — Encounter: Payer: Self-pay | Admitting: Nurse Practitioner

## 2017-06-03 ENCOUNTER — Non-Acute Institutional Stay (SKILLED_NURSING_FACILITY): Payer: Medicare Other | Admitting: Nurse Practitioner

## 2017-06-03 DIAGNOSIS — M150 Primary generalized (osteo)arthritis: Secondary | ICD-10-CM | POA: Diagnosis not present

## 2017-06-03 DIAGNOSIS — M159 Polyosteoarthritis, unspecified: Secondary | ICD-10-CM

## 2017-06-03 DIAGNOSIS — G301 Alzheimer's disease with late onset: Secondary | ICD-10-CM

## 2017-06-03 DIAGNOSIS — F0280 Dementia in other diseases classified elsewhere without behavioral disturbance: Secondary | ICD-10-CM | POA: Diagnosis not present

## 2017-06-03 DIAGNOSIS — K5909 Other constipation: Secondary | ICD-10-CM | POA: Diagnosis not present

## 2017-06-03 DIAGNOSIS — H409 Unspecified glaucoma: Secondary | ICD-10-CM | POA: Diagnosis not present

## 2017-06-03 NOTE — Progress Notes (Signed)
Location:  [facility] Room Number: [place] Number: 48 Place of Service:  SNF (31) SNF (31) Provider:  Kahleel Fadeley, Manxie  NP  Blanchie Serve, MD  Patient Care Team: Blanchie Serve, MD as PCP - General (Internal Medicine) [place] Minus Breeding, MD as Consulting Physician (Cardiology) Nathanial Rancher, MD as Consulting Physician (Ophthalmology) Leta Baptist, MD as Consulting Physician (Otolaryngology) Candiss Galeana X, NP as Nurse Practitioner (Internal Medicine)  Extended Emergency Contact Information Primary Emergency Contact: McGreggor,Wynn  Johnnette Litter of [place] Phone: [phone] Mobile Phone: [phone] Relation: Friend Secondary Emergency Contact: Daniel,Gloria Address: [place], [place] of [place] Phone: [phone] Mobile Phone: [phone] Relation: Friend  Code Status:  DNR Goals of care: Advanced Directive information Advanced Directives 06/03/2017  Does Patient Have a Medical Advance Directive? Yes  Type of Paramedic of [place];Living will;Out of facility DNR (pink MOST or yellow form)  Does patient want to make changes to medical advance directive? No - Patient declined  Copy of [place] in Chart? Yes  Pre-existing out of facility DNR order (yellow form or pink MOST form) Yellow form placed in chart (order not valid for inpatient use)     Chief Complaint  Patient presents with  . Medical Management of Chronic Issues    F/u- constipation, HTN,     HPI:  Pt is a 83 y.o. female seen today for medical management of chronic diseases.      Hx of dementia, taking Namenda and Aricept for memroy. She takes MM nightly, prnMiraLax for constipation. Arthritic pain, mainly in the right shoulder injured from falling 12/2014, healed with reduced ROM,  Taking Vit D, Ca, Tylenol prn available to her.   Past  Medical History:  Diagnosis Date  . Alzheimer's disease 01/10/2015   MMSE 25/30  . Anxiety    mild  . Anxiety 01/11/2014  . Aortic stenosis    a. Echo 03/2010:  EF 70-75%, mild AS  . Cerebral atrophy 01/10/2015  . Cerebrovascular disease 11/01/2005   Small vessel disease  . Chronic neck pain 08/06/2011  . Closed pelvic fracture ([place]) 09/29/12   left nondisplaced inferior pelvic ramus  . Glaucoma    "both eyes; no vision in left eye as a result" (04/06/2013)  . History of recurrent UTIs   . Hypertension   . Osteoarthritis 08/06/2011  . Pneumonia 04/06/2013   "for the first time" (04/06/2013)  . PVC (premature ventricular contraction)   . Senile osteoporosis 04/08/2011  . SVT (supraventricular tachycardia) ([place])   . Thumb pain 08/06/2011  . Vision loss of left eye    "can't see at all out of it" (04/06/2013)   Past Surgical History:  Procedure Laterality Date  . APPENDECTOMY    . CATARACT EXTRACTION, BILATERAL Bilateral   . GLAUCOMA SURGERY Left    "put in stent for glaucoma" (04/06/2013)  . TONSILLECTOMY      Allergies  Allergen Reactions  . Codeine Nausea Only  . Sulfate     Outpatient Encounter Prescriptions as of 06/03/2017  Medication Sig  . acetaminophen (TYLENOL) 325 MG tablet Take 650 mg by mouth every 4 (four) hours as needed.   . Calcium Citrate-Vitamin D 315-250 MG-UNIT TABS Take 1 tablet by mouth daily.   Marland Kitchen donepezil (ARICEPT) 5 MG tablet Take 5 mg by mouth at bedtime.  . feeding supplement (BOOST / RESOURCE  BREEZE) LIQD Take 1 Container by mouth 2 (two) times daily.  . magnesium hydroxide (MILK OF MAGNESIA) 400 MG/5ML suspension Take 30 mLs by mouth at bedtime.  . memantine (NAMENDA) 10 MG tablet Take 10 mg by mouth 2 (two) times daily.  . polyethylene glycol (MIRALAX / GLYCOLAX) packet Take 17 g by mouth daily as needed for mild constipation.  . polyvinyl alcohol (LIQUIFILM TEARS) 1.4 % ophthalmic solution Place 1 drop into both eyes 4 (four) times daily as needed.     No facility-administered encounter medications on file as of 06/03/2017.     Review of Systems  Constitutional: Negative for appetite change, chills and fever.  HENT: Negative for congestion, rhinorrhea, sore throat and trouble swallowing.   Eyes: Negative for pain.       No vision to left eye, has glaucoma  Respiratory: Negative for cough, shortness of breath and wheezing.   Cardiovascular: Negative for chest pain, palpitations and leg swelling.  Gastrointestinal: Negative for abdominal pain and constipation.  Genitourinary: Negative for dysuria and frequency.  Musculoskeletal: Negative for back pain.  Skin: Negative for rash and wound.  Neurological: Negative for tremors and seizures.  Psychiatric/Behavioral: Positive for confusion. Negative for agitation and behavioral problems.    Immunization History  Administered Date(s) Administered  . Influenza Split 09/22/2011, 09/13/2012  . Influenza-Unspecified 08/09/2013, 08/27/2014, 08/08/2015, 08/25/2016  . Tdap 09/13/2012   Pertinent  Health Maintenance Due  Topic Date Due  . PNA vac Low Risk Adult (1 of 2 - PCV13) 11/09/2017 (Originally 03/03/1982)  . DEXA SCAN  05/06/2018 (Originally 03/03/1982)  . INFLUENZA VACCINE  06/09/2017   Fall Risk  03/21/2015 08/23/2014 09/29/2012 09/29/2012  Falls in the past year? No No No No   Functional Status Survey:    Vitals:   06/03/17 1059  BP: 100/80  Pulse: 64  Resp: 18  Temp: 97.7 F (36.5 C)  Weight: 107 lb 11.2 oz (48.9 kg)  Height: 5\' 4"  (1.626 m)   Body mass index is 18.49 kg/m. Physical Exam  Constitutional:  Frail, elderly, thin built, female, in no acute distress  HENT:  Head: Normocephalic and atraumatic.  Mouth/Throat: Oropharynx is clear and moist.  Eyes: Conjunctivae are normal.  Neck: Normal range of motion. Neck supple.  Cardiovascular: Normal rate and regular rhythm.   Murmur heard. systolic  Pulmonary/Chest: Effort normal and breath sounds normal. She has  no wheezes. She has no rales.  Abdominal: Soft. Bowel sounds are normal. There is no tenderness.  Musculoskeletal: She exhibits no edema.  Ambulates with walker  Lymphadenopathy:    She has no cervical adenopathy.  Neurological: She is alert.  Skin: Skin is warm and dry. No rash noted. She is not diaphoretic.  Psychiatric: She has a normal mood and affect.    Labs reviewed:  Recent Labs  06/11/16 01/12/17  NA 142 141  K 4.4 4.3  BUN 23* 27*  CREATININE 1.1 1.1    Recent Labs  06/11/16 01/12/17  AST 16 13  ALT 12 10  ALKPHOS 65 56    Recent Labs  06/11/16 01/12/17  WBC 3.2 3.7  HGB 10.7* 11.0*  HCT 32* 34*  PLT 171 191   Lab Results  Component Value Date   TSH 2.28 06/11/2016   No results found for: HGBA1C Lab Results  Component Value Date   CHOL 207 (A) 08/28/2014   HDL 80 (A) 08/28/2014   LDLCALC 113 08/28/2014   TRIG 70 08/28/2014    Significant Diagnostic Results  in last 30 days:  No results found.  Assessment/Plan Alzheimer's disease Continue to decline cognitively, resides in SNF for care assistance, continue Namenda and Aricept for memory preserving.   Chronic constipation Stable, continue MOM daily,   MiraLax prn  Osteoarthritis Multiple sites, continue supervision for safety, continue Vit D, Ca, prn Tylenol.   Glaucoma No vision left eye, difficulty reading with only right eye,  needs audio books      Family/ staff Communication: SNF  Labs/tests ordered:  none

## 2017-06-03 NOTE — Assessment & Plan Note (Addendum)
No vision left eye, difficulty reading with only right eye,  needs audio books

## 2017-06-03 NOTE — Assessment & Plan Note (Signed)
Multiple sites, continue supervision for safety, continue Vit D, Ca, prn Tylenol.

## 2017-06-03 NOTE — Assessment & Plan Note (Signed)
Continue to decline cognitively, resides in SNF for care assistance, continue Namenda and Aricept for memory preserving.

## 2017-06-03 NOTE — Assessment & Plan Note (Signed)
Stable, continue MOM daily,   MiraLax prn

## 2017-06-22 ENCOUNTER — Non-Acute Institutional Stay (SKILLED_NURSING_FACILITY): Payer: Medicare Other | Admitting: Nurse Practitioner

## 2017-06-22 ENCOUNTER — Encounter: Payer: Self-pay | Admitting: Nurse Practitioner

## 2017-06-22 DIAGNOSIS — H40152 Residual stage of open-angle glaucoma, left eye: Secondary | ICD-10-CM | POA: Diagnosis not present

## 2017-06-22 DIAGNOSIS — H5712 Ocular pain, left eye: Secondary | ICD-10-CM | POA: Insufficient documentation

## 2017-06-22 NOTE — Assessment & Plan Note (Signed)
No vision left eye

## 2017-06-22 NOTE — Progress Notes (Signed)
Location:  [facility] Room Number: 49 Place of Service:  SNF (31) Provider:  Valora Norell, Manxie NP  Blanchie Serve, MD  Patient Care Team: Blanchie Serve, MD as PCP - General (Internal Medicine) [place] Minus Breeding, MD as Consulting Physician (Cardiology) Nathanial Rancher, MD as Consulting Physician (Ophthalmology) Leta Baptist, MD as Consulting Physician (Otolaryngology) Mirta Mally X, NP as Nurse Practitioner (Internal Medicine)  Extended Emergency Contact Information Primary Emergency Contact: Vinson Moselle of [place] Phone: [phone] Mobile Phone: [phone] Relation: Friend Secondary Emergency Contact: Daniel,Gloria Address: [place], [place] of [place] Phone: [phone] Mobile Phone: [phone] Relation: Friend  Code Status:  DNR Goals of care: Advanced Directive information Advanced Directives 06/22/2017  Does Patient Have a Medical Advance Directive? Yes  Type of Paramedic of [place];Living will;Out of facility DNR (pink MOST or yellow form)  Does patient want to make changes to medical advance directive? No - Patient declined  Copy of [place] in Chart? Yes  Pre-existing out of facility DNR order (yellow form or pink MOST form) Yellow form placed in chart (order not valid for inpatient use)     Chief Complaint  Patient presents with  . Acute Visit    C/o (L) eye pain w/redness, cloudy    HPI:  Pt is a 83 y.o. female seen today for an acute visit for complaining left eye ball pain for 24 hours, never felt like this before, staff reported left eye was noted to be red and teary which were not presenting upon my examination. She has history of glaucoma, no vision in the left eye.    Past Medical History:  Diagnosis Date  . Alzheimer's disease 01/10/2015   MMSE 25/30  . Anxiety    mild  . Anxiety 01/11/2014  .  Aortic stenosis    a. Echo 03/2010:  EF 70-75%, mild AS  . Cerebral atrophy 01/10/2015  . Cerebrovascular disease 11/01/2005   Small vessel disease  . Chronic neck pain 08/06/2011  . Closed pelvic fracture ([place]) 09/29/12   left nondisplaced inferior pelvic ramus  . Glaucoma    "both eyes; no vision in left eye as a result" (04/06/2013)  . History of recurrent UTIs   . Hypertension   . Osteoarthritis 08/06/2011  . Pneumonia 04/06/2013   "for the first time" (04/06/2013)  . PVC (premature ventricular contraction)   . Senile osteoporosis 04/08/2011  . SVT (supraventricular tachycardia) ([place])   . Thumb pain 08/06/2011  . Vision loss of left eye    "can't see at all out of it" (04/06/2013)   Past Surgical History:  Procedure Laterality Date  . APPENDECTOMY    . CATARACT EXTRACTION, BILATERAL Bilateral   . GLAUCOMA SURGERY Left    "put in stent for glaucoma" (04/06/2013)  . TONSILLECTOMY      Allergies  Allergen Reactions  . Codeine Nausea Only  . Sulfate     Outpatient Encounter Prescriptions as of 06/22/2017  Medication Sig  . acetaminophen (TYLENOL) 325 MG tablet Take 650 mg by mouth every 4 (four) hours as needed.   . Calcium Citrate-Vitamin D 315-250 MG-UNIT TABS Take 1 tablet by mouth daily.   Marland Kitchen donepezil (ARICEPT) 5 MG tablet Take 5 mg by mouth at bedtime.  . feeding supplement (BOOST / RESOURCE BREEZE) LIQD Take 1 Container by mouth 2 (two) times daily.  . magnesium  hydroxide (MILK OF MAGNESIA) 400 MG/5ML suspension Take 30 mLs by mouth at bedtime.  . memantine (NAMENDA) 10 MG tablet Take 10 mg by mouth 2 (two) times daily.  . polyethylene glycol (MIRALAX / GLYCOLAX) packet Take 17 g by mouth daily as needed for mild constipation.  . polyvinyl alcohol (LIQUIFILM TEARS) 1.4 % ophthalmic solution Place 1 drop into both eyes 4 (four) times daily as needed.    No facility-administered encounter medications on file as of 06/22/2017.     Review of Systems  Constitutional: Negative  for activity change and fever.  HENT: Positive for hearing loss. Negative for congestion, ear pain, facial swelling, sinus pain, sinus pressure and voice change.   Eyes: Positive for pain and visual disturbance. Negative for discharge, redness and itching.       No vision to left eye, has glaucoma, left eye ball pain x 24 hours  Psychiatric/Behavioral: Negative for confusion.    Immunization History  Administered Date(s) Administered  . Influenza Split 09/22/2011, 09/13/2012  . Influenza-Unspecified 08/09/2013, 08/27/2014, 08/08/2015, 08/25/2016  . Tdap 09/13/2012   Pertinent  Health Maintenance Due  Topic Date Due  . INFLUENZA VACCINE  06/09/2017  . PNA vac Low Risk Adult (1 of 2 - PCV13) 11/09/2017 (Originally 03/03/1982)  . DEXA SCAN  05/06/2018 (Originally 03/03/1982)   Fall Risk  03/21/2015 08/23/2014 09/29/2012 09/29/2012  Falls in the past year? No No No No   Functional Status Survey:    Vitals:   06/22/17 1529  BP: 128/62  Pulse: 64  Resp: 18  Temp: 98.4 F (36.9 C)  Weight: 108 lb 12.8 oz (49.4 kg)  Height: 5\' 4"  (1.626 m)   Body mass index is 18.68 kg/m. Physical Exam  Constitutional: She appears well-developed and well-nourished.  Frail, elderly, thin built, female, in no acute distress  HENT:  Head: Normocephalic and atraumatic.  Eyes: Conjunctivae are normal. Right eye exhibits no chemosis, no discharge, no exudate and no hordeolum. No foreign body present in the right eye. Left eye exhibits no chemosis, no discharge, no exudate and no hordeolum. No foreign body present in the left eye. Right conjunctiva is not injected. Right conjunctiva has no hemorrhage. Left conjunctiva is not injected. Left conjunctiva has no hemorrhage. No scleral icterus. Left eye exhibits abnormal extraocular motion. Right pupil is not round.  Cloudy and slightly protruding area within iris circle, pupil is not visualized. The right pupil is irregular, but reaction to light briskly.     Neck: Neck supple.  Cardiovascular:  No murmur heard.   Labs reviewed:  Recent Labs  01/12/17  NA 141  K 4.3  BUN 27*  CREATININE 1.1    Recent Labs  01/12/17  AST 13  ALT 10  ALKPHOS 56    Recent Labs  01/12/17  WBC 3.7  HGB 11.0*  HCT 34*  PLT 191   Lab Results  Component Value Date   TSH 2.28 06/11/2016   No results found for: HGBA1C Lab Results  Component Value Date   CHOL 207 (A) 08/28/2014   HDL 80 (A) 08/28/2014   LDLCALC 113 08/28/2014   TRIG 70 08/28/2014    Significant Diagnostic Results in last 30 days:  No results found.  Assessment/Plan Glaucoma No vision left eye  Acute left eye pain Left eye ball pain for 24 hours, hazy and slightly protruding area within the iris of the left eye, not able to visualize pupil, no conjunctival redness, no drainage seen. Hx of glaucoma and no  vision of the left eye. An appointment with her Ophthalmology in am. Observe      Family/ staff Communication: plan of care reviewed with the patient and charge nurse.  Labs/tests ordered:  none  Time spend 25 minutes

## 2017-06-22 NOTE — Assessment & Plan Note (Signed)
Left eye ball pain for 24 hours, hazy and slightly protruding area within the iris of the left eye, not able to visualize pupil, no conjunctival redness, no drainage seen. Hx of glaucoma and no vision of the left eye. An appointment with her Ophthalmology in am. Observe

## 2017-06-23 DIAGNOSIS — H18422 Band keratopathy, left eye: Secondary | ICD-10-CM | POA: Diagnosis not present

## 2017-06-29 ENCOUNTER — Non-Acute Institutional Stay (SKILLED_NURSING_FACILITY): Payer: Medicare Other

## 2017-06-29 DIAGNOSIS — Z0000 Encounter for general adult medical examination without abnormal findings: Secondary | ICD-10-CM

## 2017-06-29 NOTE — Patient Instructions (Signed)
Ms. Brittany Warren , Thank you for taking time to come for your Medicare Wellness Visit. I appreciate your ongoing commitment to your health goals. Please review the following plan we discussed and let me know if I can assist you in the future.   Screening recommendations/referrals: Colonoscopy excluded, pt over age 81 Mammogram excluded, pt over age 90 Bone Density due Recommended yearly ophthalmology/optometry visit for glaucoma screening and checkup Recommended yearly dental visit for hygiene and checkup  Vaccinations: Influenza vaccine due 2018 fall season Pneumococcal vaccine 13 due Tdap vaccine up to date. Due 09/13/22 Shingles vaccine not in records  Advanced directives: Living will needed for chart. DNR and health care power of attorney in chart.  Conditions/risks identified: None  Next appointment: Dr. Bubba Camp makes rounds   Preventive Care 51 Years and Older, Female Preventive care refers to lifestyle choices and visits with your health care provider that can promote health and wellness. What does preventive care include?  A yearly physical exam. This is also called an annual well check.  Dental exams once or twice a year.  Routine eye exams. Ask your health care provider how often you should have your eyes checked.  Personal lifestyle choices, including:  Daily care of your teeth and gums.  Regular physical activity.  Eating a healthy diet.  Avoiding tobacco and drug use.  Limiting alcohol use.  Practicing safe sex.  Taking low-dose aspirin every day.  Taking vitamin and mineral supplements as recommended by your health care provider. What happens during an annual well check? The services and screenings done by your health care provider during your annual well check will depend on your age, overall health, lifestyle risk factors, and family history of disease. Counseling  Your health care provider may ask you questions about your:  Alcohol use.  Tobacco  use.  Drug use.  Emotional well-being.  Home and relationship well-being.  Sexual activity.  Eating habits.  History of falls.  Memory and ability to understand (cognition).  Work and work Statistician.  Reproductive health. Screening  You may have the following tests or measurements:  Height, weight, and BMI.  Blood pressure.  Lipid and cholesterol levels. These may be checked every 5 years, or more frequently if you are over 58 years old.  Skin check.  Lung cancer screening. You may have this screening every year starting at age 10 if you have a 30-pack-year history of smoking and currently smoke or have quit within the past 15 years.  Fecal occult blood test (FOBT) of the stool. You may have this test every year starting at age 4.  Flexible sigmoidoscopy or colonoscopy. You may have a sigmoidoscopy every 5 years or a colonoscopy every 10 years starting at age 19.  Hepatitis C blood test.  Hepatitis B blood test.  Sexually transmitted disease (STD) testing.  Diabetes screening. This is done by checking your blood sugar (glucose) after you have not eaten for a while (fasting). You may have this done every 1-3 years.  Bone density scan. This is done to screen for osteoporosis. You may have this done starting at age 15.  Mammogram. This may be done every 1-2 years. Talk to your health care provider about how often you should have regular mammograms. Talk with your health care provider about your test results, treatment options, and if necessary, the need for more tests. Vaccines  Your health care provider may recommend certain vaccines, such as:  Influenza vaccine. This is recommended every year.  Tetanus, diphtheria,  and acellular pertussis (Tdap, Td) vaccine. You may need a Td booster every 10 years.  Zoster vaccine. You may need this after age 50.  Pneumococcal 13-valent conjugate (PCV13) vaccine. One dose is recommended after age 14.  Pneumococcal  polysaccharide (PPSV23) vaccine. One dose is recommended after age 1. Talk to your health care provider about which screenings and vaccines you need and how often you need them. This information is not intended to replace advice given to you by your health care provider. Make sure you discuss any questions you have with your health care provider. Document Released: 11/22/2015 Document Revised: 07/15/2016 Document Reviewed: 08/27/2015 Elsevier Interactive Patient Education  2017 [place] Prevention in the Home Falls can cause injuries. They can happen to people of all ages. There are many things you can do to make your home safe and to help prevent falls. What can I do on the outside of my home?  Regularly fix the edges of walkways and driveways and fix any cracks.  Remove anything that might make you trip as you walk through a door, such as a raised step or threshold.  Trim any bushes or trees on the path to your home.  Use bright outdoor lighting.  Clear any walking paths of anything that might make someone trip, such as rocks or tools.  Regularly check to see if handrails are loose or broken. Make sure that both sides of any steps have handrails.  Any raised decks and porches should have guardrails on the edges.  Have any leaves, snow, or ice cleared regularly.  Use sand or salt on walking paths during winter.  Clean up any spills in your garage right away. This includes oil or grease spills. What can I do in the bathroom?  Use night lights.  Install grab bars by the toilet and in the tub and shower. Do not use towel bars as grab bars.  Use non-skid mats or decals in the tub or shower.  If you need to sit down in the shower, use a plastic, non-slip stool.  Keep the floor dry. Clean up any water that spills on the floor as soon as it happens.  Remove soap buildup in the tub or shower regularly.  Attach bath mats securely with double-sided non-slip rug  tape.  Do not have throw rugs and other things on the floor that can make you trip. What can I do in the bedroom?  Use night lights.  Make sure that you have a light by your bed that is easy to reach.  Do not use any sheets or blankets that are too big for your bed. They should not hang down onto the floor.  Have a firm chair that has side arms. You can use this for support while you get dressed.  Do not have throw rugs and other things on the floor that can make you trip. What can I do in the kitchen?  Clean up any spills right away.  Avoid walking on wet floors.  Keep items that you use a lot in easy-to-reach places.  If you need to reach something above you, use a strong step stool that has a grab bar.  Keep electrical cords out of the way.  Do not use floor polish or wax that makes floors slippery. If you must use wax, use non-skid floor wax.  Do not have throw rugs and other things on the floor that can make you trip. What can I do with my  stairs?  Do not leave any items on the stairs.  Make sure that there are handrails on both sides of the stairs and use them. Fix handrails that are broken or loose. Make sure that handrails are as long as the stairways.  Check any carpeting to make sure that it is firmly attached to the stairs. Fix any carpet that is loose or worn.  Avoid having throw rugs at the top or bottom of the stairs. If you do have throw rugs, attach them to the floor with carpet tape.  Make sure that you have a light switch at the top of the stairs and the bottom of the stairs. If you do not have them, ask someone to add them for you. What else can I do to help prevent falls?  Wear shoes that:  Do not have high heels.  Have rubber bottoms.  Are comfortable and fit you well.  Are closed at the toe. Do not wear sandals.  If you use a stepladder:  Make sure that it is fully opened. Do not climb a closed stepladder.  Make sure that both sides of the  stepladder are locked into place.  Ask someone to hold it for you, if possible.  Clearly mark and make sure that you can see:  Any grab bars or handrails.  First and last steps.  Where the edge of each step is.  Use tools that help you move around (mobility aids) if they are needed. These include:  Canes.  Walkers.  Scooters.  Crutches.  Turn on the lights when you go into a dark area. Replace any light bulbs as soon as they burn out.  Set up your furniture so you have a clear path. Avoid moving your furniture around.  If any of your floors are uneven, fix them.  If there are any pets around you, be aware of where they are.  Review your medicines with your doctor. Some medicines can make you feel dizzy. This can increase your chance of falling. Ask your doctor what other things that you can do to help prevent falls. This information is not intended to replace advice given to you by your health care provider. Make sure you discuss any questions you have with your health care provider. Document Released: 08/22/2009 Document Revised: 04/02/2016 Document Reviewed: 11/30/2014 Elsevier Interactive Patient Education  2017 [employer].

## 2017-06-29 NOTE — Progress Notes (Signed)
Subjective:   Brittany Warren is a 83 y.o. female who presents for Medicare Annual (Subsequent) preventive examination at [facility]  Last AWV-11/51/13       Objective:     Vitals: BP (!) 120/56 (BP Location: Left Arm, Patient Position: Supine)   Pulse 68   Temp 98.3 F (36.8 C) (Oral)   Ht 5\' 4"  (1.626 m)   Wt 109 lb (49.4 kg)   SpO2 93%   BMI 18.71 kg/m   Body mass index is 18.71 kg/m.   Tobacco History  Smoking Status  . Former Smoker  . Packs/day: 0.50  . Years: 30.00  . Types: Cigarettes  . Quit date: 01/14/1984  Smokeless Tobacco  . Never Used     Counseling given: Not Answered   Past Medical History:  Diagnosis Date  . Alzheimer's disease 01/10/2015   MMSE 25/30  . Anxiety    mild  . Anxiety 01/11/2014  . Aortic stenosis    a. Echo 03/2010:  EF 70-75%, mild AS  . Cerebral atrophy 01/10/2015  . Cerebrovascular disease 11/01/2005   Small vessel disease  . Chronic neck pain 08/06/2011  . Closed pelvic fracture ([place]) 09/29/12   left nondisplaced inferior pelvic ramus  . Glaucoma    "both eyes; no vision in left eye as a result" (04/06/2013)  . History of recurrent UTIs   . Hypertension   . Osteoarthritis 08/06/2011  . Pneumonia 04/06/2013   "for the first time" (04/06/2013)  . PVC (premature ventricular contraction)   . Senile osteoporosis 04/08/2011  . SVT (supraventricular tachycardia) ([place])   . Thumb pain 08/06/2011  . Vision loss of left eye    "can't see at all out of it" (04/06/2013)   Past Surgical History:  Procedure Laterality Date  . APPENDECTOMY    . CATARACT EXTRACTION, BILATERAL Bilateral   . GLAUCOMA SURGERY Left    "put in stent for glaucoma" (04/06/2013)  . TONSILLECTOMY     History reviewed. No pertinent family history. History  Sexual Activity  . Sexual activity: No    Outpatient Encounter Prescriptions as of 06/29/2017  Medication Sig  . acetaminophen (TYLENOL) 325 MG tablet Take 650 mg by mouth every 4 (four) hours as  needed.   . Calcium Citrate-Vitamin D 315-250 MG-UNIT TABS Take 1 tablet by mouth daily.   Marland Kitchen donepezil (ARICEPT) 5 MG tablet Take 5 mg by mouth at bedtime.  . feeding supplement (BOOST / RESOURCE BREEZE) LIQD Take 1 Container by mouth 2 (two) times daily.  Marland Kitchen ketorolac (ACULAR) 0.4 % SOLN 1 drop 4 (four) times daily.  . magnesium hydroxide (MILK OF MAGNESIA) 400 MG/5ML suspension Take 30 mLs by mouth at bedtime.  . memantine (NAMENDA) 10 MG tablet Take 10 mg by mouth 2 (two) times daily.  . polyethylene glycol (MIRALAX / GLYCOLAX) packet Take 17 g by mouth daily as needed for mild constipation.  . polyvinyl alcohol (LIQUIFILM TEARS) 1.4 % ophthalmic solution Place 1 drop into both eyes 4 (four) times daily as needed.    No facility-administered encounter medications on file as of 06/29/2017.     Activities of Daily Living In your present state of health, do you have any difficulty performing the following activities: 06/29/2017  Hearing? Y  Vision? Y  Difficulty concentrating or making decisions? Y  Walking or climbing stairs? Y  Dressing or bathing? Y  Doing errands, shopping? Y  Preparing Food and eating ? Y  Using the Toilet? Y  In  the past six months, have you accidently leaked urine? Y  Do you have problems with loss of bowel control? Y  Managing your Medications? Y  Managing your Finances? Y  Housekeeping or managing your Housekeeping? Y  Some recent data might be hidden    Patient Care Team: Blanchie Serve, MD as PCP - General (Internal Medicine) [place] Minus Breeding, MD as Consulting Physician (Cardiology) Nathanial Rancher, MD as Consulting Physician (Ophthalmology) Leta Baptist, MD as Consulting Physician (Otolaryngology) Mast, Man X, NP as Nurse Practitioner (Internal Medicine)    Assessment:     Exercise Activities and Dietary recommendations Current Exercise Habits: The patient does not participate in regular exercise at present, Exercise limited by:  neurologic condition(s)  Goals    None     Fall Risk Fall Risk  06/29/2017 03/21/2015 08/23/2014 09/29/2012 09/29/2012  Falls in the past year? - No No No No  Number falls in past yr: 1 - - - -  Injury with Fall? No - - - -   Depression Screen PHQ 2/9 Scores 06/29/2017 03/21/2015 08/23/2014 09/29/2012  PHQ - 2 Score 4 0 0 0  PHQ- 9 Score 8 - - -     Cognitive Function MMSE - Mini Mental State Exam 06/29/2017 01/02/2016 01/10/2015  Orientation to time 0 2 3  Orientation to Place 5 4 5   Registration 3 3 3   Attention/ Calculation 4 5 4   Recall 0 3 2  Language- name 2 objects 2 2 2   Language- repeat 1 1 1   Language- follow 3 step command 3 3 3   Language- read & follow direction 0 1 1  Write a sentence 1 1 1   Copy design 0 0 0  Total score 19 25 25         Immunization History  Administered Date(s) Administered  . Influenza Split 09/22/2011, 09/13/2012  . Influenza-Unspecified 08/09/2013, 08/27/2014, 08/08/2015, 08/25/2016  . Tdap 09/13/2012   Screening Tests Health Maintenance  Topic Date Due  . INFLUENZA VACCINE  06/09/2017  . PNA vac Low Risk Adult (1 of 2 - PCV13) 11/09/2017 (Originally [birth year]1984)  . DEXA SCAN  05/06/2018 (Originally [birth year]03/1984)  . TETANUS/TDAP  09/13/2022      Plan:    I have personally reviewed and addressed the Medicare Annual Wellness questionnaire and have noted the following in the patient's chart:  A. Medical and social history B. Use of alcohol, tobacco or illicit drugs  C. Current medications and supplements D. Functional ability and status E.  Nutritional status F.  Physical activity G. Advance directives H. List of other physicians I.  Hospitalizations, surgeries, and ER visits in previous 12 months J.  [place] to include hearing, vision, cognitive, depression L. Referrals and appointments - none  In addition, I have reviewed and discussed with patient certain preventive protocols, quality metrics, and best practice  recommendations. A written personalized care plan for preventive services as well as general preventive health recommendations were provided to patient.  See attached scanned questionnaire for additional information.   Signed,   Rich Reining, RN Nurse Health Advisor   Quick Notes   Health Maintenance: DEXA and PNA 13 due     Abnormal Screen: MMSE 19/30. Did not pass clock drawing     Patient Concerns: None     Nurse Concerns: None

## 2017-07-01 ENCOUNTER — Non-Acute Institutional Stay (SKILLED_NURSING_FACILITY): Payer: Medicare Other | Admitting: Internal Medicine

## 2017-07-01 ENCOUNTER — Encounter: Payer: Self-pay | Admitting: Internal Medicine

## 2017-07-01 DIAGNOSIS — E440 Moderate protein-calorie malnutrition: Secondary | ICD-10-CM

## 2017-07-01 DIAGNOSIS — F0280 Dementia in other diseases classified elsewhere without behavioral disturbance: Secondary | ICD-10-CM

## 2017-07-01 DIAGNOSIS — I10 Essential (primary) hypertension: Secondary | ICD-10-CM | POA: Diagnosis not present

## 2017-07-01 DIAGNOSIS — Z8673 Personal history of transient ischemic attack (TIA), and cerebral infarction without residual deficits: Secondary | ICD-10-CM | POA: Diagnosis not present

## 2017-07-01 DIAGNOSIS — F0150 Vascular dementia without behavioral disturbance: Secondary | ICD-10-CM

## 2017-07-01 DIAGNOSIS — G309 Alzheimer's disease, unspecified: Secondary | ICD-10-CM | POA: Diagnosis not present

## 2017-07-01 NOTE — Progress Notes (Signed)
Location:  [facility] Room Number: 43 Place of Service:  SNF [phone]) Provider:  Blanchie Serve MD  Blanchie Serve, MD  Patient Care Team: Blanchie Serve, MD as PCP - General (Internal Medicine) Brittany [place], [facility] Minus Breeding, MD as Consulting Physician (Cardiology) Brittany Rancher, MD as Consulting Physician (Ophthalmology) Brittany Baptist, MD as Consulting Physician (Otolaryngology) Mast, Man X, NP as Nurse Practitioner (Internal Medicine)  Extended Emergency Contact Information Primary Emergency Contact: Brittany Warren of [place] Phone: [phone] Mobile Phone: [phone] Relation: Friend Secondary Emergency Contact: Brittany Warren,Brittany Warren Address: [place], [place] of [place] Phone: [phone] Mobile Phone: [phone] Relation: Friend  Code Status:  DNR Goals of care: Advanced Directive information Advanced Directives 06/29/2017  Does Patient Have a Medical Advance Directive? Yes  Type of Paramedic of [place];Out of facility DNR (pink MOST or yellow form)  Does patient want to make changes to medical advance directive? No - Patient declined  Copy of [place] in Chart? Yes  Pre-existing out of facility DNR order (yellow form or pink MOST form) Yellow form placed in chart (order not valid for inpatient use);Pink MOST form placed in chart (order not valid for inpatient use)     Chief Complaint  Patient presents with  . Medical Management of Chronic Issues    Routine Visit     HPI:  Pt is a 83 y.o. female seen today for medical management of chronic diseases. She is seen in her room today. She denies any concern. No concerns from nursing staff. She gets around the facility with her walker. No fall reported. She is compliant with her medications.    Past Medical History:  Diagnosis Date  . Alzheimer's disease 01/10/2015   MMSE 25/30  .  Anxiety    mild  . Anxiety 01/11/2014  . Aortic stenosis    a. Echo 03/2010:  EF 70-75%, mild AS  . Cerebral atrophy 01/10/2015  . Cerebrovascular disease 11/01/2005   Small vessel disease  . Chronic neck pain 08/06/2011  . Closed pelvic fracture ([place]) 09/29/12   left nondisplaced inferior pelvic ramus  . Glaucoma    "both eyes; no vision in left eye as a result" (04/06/2013)  . History of recurrent UTIs   . Hypertension   . Osteoarthritis 08/06/2011  . Pneumonia 04/06/2013   "for the first time" (04/06/2013)  . PVC (premature ventricular contraction)   . Senile osteoporosis 04/08/2011  . SVT (supraventricular tachycardia) ([place])   . Thumb pain 08/06/2011  . Vision loss of left eye    "can't see at all out of it" (04/06/2013)   Past Surgical History:  Procedure Laterality Date  . APPENDECTOMY    . CATARACT EXTRACTION, BILATERAL Bilateral   . GLAUCOMA SURGERY Left    "put in stent for glaucoma" (04/06/2013)  . TONSILLECTOMY      Allergies  Allergen Reactions  . Codeine Nausea Only  . Sulfate     Outpatient Encounter Prescriptions as of 07/01/2017  Medication Sig  . acetaminophen (TYLENOL) 325 MG tablet Take 650 mg by mouth every 4 (four) hours as needed.   . Calcium Citrate-Vitamin D 315-250 MG-UNIT TABS Take 1 tablet by mouth daily.   Marland Kitchen donepezil (ARICEPT) 5 MG tablet Take 5 mg by mouth at bedtime.  . feeding supplement (BOOST / RESOURCE BREEZE) LIQD Take 1 Container by mouth 2 (two) times daily.  Marland Kitchen  ketorolac (ACULAR) 0.4 % SOLN Place 1 drop into the left eye 4 (four) times daily as needed.   . magnesium hydroxide (MILK OF MAGNESIA) 400 MG/5ML suspension Take 30 mLs by mouth at bedtime.  . memantine (NAMENDA) 10 MG tablet Take 10 mg by mouth 2 (two) times daily.  . polyethylene glycol (MIRALAX / GLYCOLAX) packet Take 17 g by mouth daily as needed for mild constipation.  . polyvinyl alcohol (LIQUIFILM TEARS) 1.4 % ophthalmic solution Place 1 drop into both eyes 4 (four) times daily  as needed.    No facility-administered encounter medications on file as of 07/01/2017.     Review of Systems  Constitutional: Negative for appetite change, chills and fever.  HENT: Negative for congestion, rhinorrhea, sore throat and trouble swallowing.   Eyes: Negative for pain.       No vision to left eye, has glaucoma  Respiratory: Negative for cough, shortness of breath and wheezing.   Cardiovascular: Negative for chest pain, palpitations and leg swelling.  Gastrointestinal: Negative for abdominal pain, constipation, diarrhea, nausea and vomiting.  Genitourinary: Negative for dysuria.  Musculoskeletal: Negative for back pain.  Skin:       No pressure ulcer or wound reported  Neurological: Negative for tremors and seizures.       No fall reported  Psychiatric/Behavioral: Positive for agitation, behavioral problems and confusion.    Immunization History  Administered Date(s) Administered  . Influenza Split 09/22/2011, 09/13/2012  . Influenza-Unspecified 08/09/2013, 08/27/2014, 08/08/2015, 08/25/2016  . Tdap 09/13/2012   Pertinent  Health Maintenance Due  Topic Date Due  . INFLUENZA VACCINE  06/09/2017  . PNA vac Low Risk Adult (1 of 2 - PCV13) 11/09/2017 (Originally [birth year]03/1984)  . DEXA SCAN  05/06/2018 (Originally [birth year]03/1984)   Fall Risk  06/29/2017 03/21/2015 08/23/2014 09/29/2012 09/29/2012  Falls in the past year? - No No No No  Number falls in past yr: 1 - - - -  Injury with Fall? No - - - -   Functional Status Survey:    Vitals:   07/01/17 1455  BP: 132/78  Pulse: 80  Resp: 18  Temp: 98.4 F (36.9 C)  TempSrc: Oral  Weight: 109 lb (49.4 kg)  Height: 5\' 4"  (1.626 m)   Body mass index is 18.71 kg/m. Physical Exam  Constitutional:  Frail, elderly, thin built, female, in no acute distress  HENT:  Head: Normocephalic and atraumatic.  Mouth/Throat: Oropharynx is clear and moist.  Eyes: Conjunctivae are normal.  Neck: Normal range of motion. Neck supple.    Cardiovascular: Normal rate and regular rhythm.   Murmur heard. systolic  Pulmonary/Chest: Effort normal and breath sounds normal. She has no wheezes. She has no rales.  Abdominal: Soft. Bowel sounds are normal. There is no tenderness. There is no guarding.  Musculoskeletal: She exhibits no edema.  Kyphosis present. Arthritis changes to her fingers. Can move all 4 extremities, gets aroud with her walker and needs minimal assist with transfers  Lymphadenopathy:    She has no cervical adenopathy.  Neurological: She is alert.  Oriented to person, place but not to time  Skin: Skin is warm and dry. No rash noted. She is not diaphoretic.  Psychiatric: She has a normal mood and affect.    Labs reviewed:  Recent Labs  01/12/17  NA 141  K 4.3  BUN 27*  CREATININE 1.1    Recent Labs  01/12/17  AST 13  ALT 10  ALKPHOS 56    Recent Labs  01/12/17  WBC 3.7  HGB 11.0*  HCT 34*  PLT 191   Lab Results  Component Value Date   TSH 2.28 06/11/2016   No results found for: HGBA1C Lab Results  Component Value Date   CHOL 207 (A) 08/28/2014   HDL 80 (A) 08/28/2014   LDLCALC 113 08/28/2014   TRIG 70 08/28/2014    Significant Diagnostic Results in last 30 days:  No results found.  Assessment/Plan  Mixed vascular and alzheimer's dementia Continue donepezil 5 mg daily. Also on memantine 10 mg bid. Supportive care.   Protein calorie malnutrition Monitor po intake, continue nutritional supplement. Decline anticipated.   History of CVA Off medications, fall prevention. monitor clinically  Hypertension BP overall stable, off medications. Monitor.    Family/ staff Communication: reviewed care plan with patient and charge nurse.    Labs/tests ordered: none   Blanchie Serve, MD Internal Medicine [facility] [address] [place], [place] [address] Cell Phone (Monday-Friday 8 am - 5 pm): [phone] On Call: [phone] and follow  prompts after 5 pm and on weekends Office Phone: [phone] Office Fax: [phone]

## 2017-07-06 ENCOUNTER — Non-Acute Institutional Stay (SKILLED_NURSING_FACILITY): Payer: Medicare Other | Admitting: Internal Medicine

## 2017-07-06 ENCOUNTER — Encounter: Payer: Self-pay | Admitting: Internal Medicine

## 2017-07-06 DIAGNOSIS — F0280 Dementia in other diseases classified elsewhere without behavioral disturbance: Secondary | ICD-10-CM

## 2017-07-06 DIAGNOSIS — R2681 Unsteadiness on feet: Secondary | ICD-10-CM | POA: Diagnosis not present

## 2017-07-06 DIAGNOSIS — G309 Alzheimer's disease, unspecified: Secondary | ICD-10-CM | POA: Diagnosis not present

## 2017-07-06 DIAGNOSIS — Z7189 Other specified counseling: Secondary | ICD-10-CM

## 2017-07-06 NOTE — Progress Notes (Signed)
Location:  [facility] Room Number: 86 Place of Service:  SNF [phone]) Provider:  Blanchie Serve MD  Blanchie Serve, MD  Patient Care Team: Blanchie Serve, MD as PCP - General (Internal Medicine) [place] Minus Breeding, MD as Consulting Physician (Cardiology) Nathanial Rancher, MD as Consulting Physician (Ophthalmology) Leta Baptist, MD as Consulting Physician (Otolaryngology) Mast, Man X, NP as Nurse Practitioner (Internal Medicine)  Extended Emergency Contact Information Primary Emergency Contact: Vinson Moselle of [place] Phone: [phone] Mobile Phone: [phone] Relation: Friend Secondary Emergency Contact: Daniel,Gloria Address: [place], [place] of [place] Phone: [phone] Mobile Phone: [phone] Relation: Friend  Code Status:  DNR Goals of care: Advanced Directive information Advanced Directives 07/06/2017  Does Patient Have a Medical Advance Directive? Yes  Type of Advance Directive Out of facility DNR (pink MOST or yellow form);Healthcare Power of Attorney  Does patient want to make changes to medical advance directive? No - Patient declined  Copy of [place] in Chart? Yes  Pre-existing out of facility DNR order (yellow form or pink MOST form) Yellow form placed in chart (order not valid for inpatient use);Pink MOST form placed in chart (order not valid for inpatient use)     Chief Complaint  Patient presents with  . Acute Visit    Family care plan meeting    HPI:  Pt is a 83 y.o. female seen today for advance care plan meeting. She has dementia and this limits her participation. Her dementia is advanced. She participates in conversation some. No acute behavior changes reported. Her HCPOA is present along with Education officer, museum during this meeting. Resident has alzheimer's disease, anxiety, CVA, aortic stenosis and protein calorie  malnutrition among others.    Past Medical History:  Diagnosis Date  . Alzheimer's disease 01/10/2015   MMSE 25/30  . Anxiety    mild  . Anxiety 01/11/2014  . Aortic stenosis    a. Echo 03/2010:  EF 70-75%, mild AS  . Cerebral atrophy 01/10/2015  . Cerebrovascular disease 11/01/2005   Small vessel disease  . Chronic neck pain 08/06/2011  . Closed pelvic fracture ([place]) 09/29/12   left nondisplaced inferior pelvic ramus  . Glaucoma    "both eyes; no vision in left eye as a result" (04/06/2013)  . History of recurrent UTIs   . Hypertension   . Osteoarthritis 08/06/2011  . Pneumonia 04/06/2013   "for the first time" (04/06/2013)  . PVC (premature ventricular contraction)   . Senile osteoporosis 04/08/2011  . SVT (supraventricular tachycardia) ([place])   . Thumb pain 08/06/2011  . Vision loss of left eye    "can't see at all out of it" (04/06/2013)   Past Surgical History:  Procedure Laterality Date  . APPENDECTOMY    . CATARACT EXTRACTION, BILATERAL Bilateral   . GLAUCOMA SURGERY Left    "put in stent for glaucoma" (04/06/2013)  . TONSILLECTOMY      Allergies  Allergen Reactions  . Codeine Nausea Only  . Sulfate     Outpatient Encounter Prescriptions as of 07/06/2017  Medication Sig  . acetaminophen (TYLENOL) 325 MG tablet Take 650 mg by mouth every 4 (four) hours as needed.   . Calcium Citrate-Vitamin D 315-250 MG-UNIT TABS Take 1 tablet by mouth daily.   Marland Kitchen donepezil (ARICEPT) 5 MG tablet Take 5 mg by mouth at bedtime.  . feeding supplement (BOOST / RESOURCE BREEZE)  LIQD Take 1 Container by mouth 2 (two) times daily.  Marland Kitchen ketorolac (ACULAR) 0.4 % SOLN Place 1 drop into the left eye 4 (four) times daily as needed.   . magnesium hydroxide (MILK OF MAGNESIA) 400 MG/5ML suspension Take 30 mLs by mouth at bedtime.  . memantine (NAMENDA) 10 MG tablet Take 10 mg by mouth 2 (two) times daily.  . polyethylene glycol (MIRALAX / GLYCOLAX) packet Take 17 g by mouth daily as needed for mild  constipation.  . polyvinyl alcohol (LIQUIFILM TEARS) 1.4 % ophthalmic solution Place 1 drop into both eyes 4 (four) times daily as needed.    No facility-administered encounter medications on file as of 07/06/2017.     Review of Systems  Constitutional: Negative for chills and fever.  HENT: Negative for congestion, sore throat and trouble swallowing.   Eyes: Negative for pain.       No vision to left eye, has glaucoma  Respiratory: Negative for cough, shortness of breath and wheezing.   Cardiovascular: Negative for chest pain, palpitations and leg swelling.  Gastrointestinal: Negative for abdominal pain, constipation, diarrhea, nausea and vomiting.  Genitourinary: Negative for dysuria.  Musculoskeletal: Negative for back pain.  Skin:       No pressure ulcer or wound reported  Neurological: Negative for tremors and seizures.       No fall reported  Psychiatric/Behavioral: Positive for behavioral problems and confusion. Negative for agitation.    Immunization History  Administered Date(s) Administered  . Influenza Split 09/22/2011, 09/13/2012  . Influenza-Unspecified 08/09/2013, 08/27/2014, 08/08/2015, 08/25/2016  . Tdap 09/13/2012   Pertinent  Health Maintenance Due  Topic Date Due  . INFLUENZA VACCINE  06/09/2017  . PNA vac Low Risk Adult (1 of 2 - PCV13) 11/09/2017 (Originally [birth year]/1984)  . DEXA SCAN  05/06/2018 (Originally [birth year]03/1984)   Fall Risk  06/29/2017 03/21/2015 08/23/2014 09/29/2012 09/29/2012  Falls in the past year? - No No No No  Number falls in past yr: 1 - - - -  Injury with Fall? No - - - -   Functional Status Survey:    Vitals:   07/06/17 1335  BP: 132/78  Pulse: 76  Resp: 18  Temp: 98.4 F (36.9 C)  TempSrc: Oral  Weight: 109 lb (49.4 kg)  Height: 5' 4"  (1.626 m)   Body mass index is 18.71 kg/m. Physical Exam  Constitutional:  Frail, elderly, thin built, female, in no acute distress  HENT:  Head: Normocephalic and atraumatic.  Mouth/Throat:  Oropharynx is clear and moist.  Neck: Normal range of motion. Neck supple.  Cardiovascular: Normal rate and regular rhythm.   Murmur heard. systolic  Pulmonary/Chest: Effort normal and breath sounds normal. She has no wheezes. She has no rales.  Abdominal: Soft. Bowel sounds are normal. There is no tenderness. There is no guarding.  Musculoskeletal: She exhibits no edema.  Kyphosis present. Arthritis changes to her fingers. Can move all 4 extremities, gets aroud with her walker and needs minimal assist with transfers  Lymphadenopathy:    She has no cervical adenopathy.  Neurological: She is alert.  Oriented to person, place but not to time  Skin: Skin is warm and dry. No rash noted. She is not diaphoretic.  Psychiatric: She has a normal mood and affect.    Labs reviewed:  Recent Labs  01/12/17  NA 141  K 4.3  BUN 27*  CREATININE 1.1    Recent Labs  01/12/17  AST 13  ALT 10  ALKPHOS 56  Recent Labs  01/12/17  WBC 3.7  HGB 11.0*  HCT 34*  PLT 191   Lab Results  Component Value Date   TSH 2.28 06/11/2016   No results found for: HGBA1C Lab Results  Component Value Date   CHOL 207 (A) 08/28/2014   HDL 80 (A) 08/28/2014   LDLCALC 113 08/28/2014   TRIG 70 08/28/2014     Assessment/Plan  Unsteady gait Gets around with her walker. No fall reported. Poor safety awareness.  Alzheimer's disease No acute behavior changes. Reviewed care plan regarding goals of care with patient and her HCPOA. MOST form filled out as below. Continue namenda and donepezil.   Advanced care planning Met with patient's HCPOA and social worker to review goals of care. Living will and HCPOA paper in chart. DNR form signed and placed in chart. MOST form has been filled out. No further hospitalization. IV fluids for defined trial period. To determine the use of antibiotic when infection occurs. Spent 30 minutes from 2 pm-2:30 pm reviewing goals of care and filling out MOST form.     Family/ staff Communication: reviewed care plan with patient, HCPOA, social work and Camera operator.     I spent 40 minutes in total face-to-face time with the patient, more than 50% of which was spent in advance care planning.     Blanchie Serve, MD Internal Medicine [facility] [address] [place], [place] [address] Cell Phone (Monday-Friday 8 am - 5 pm): [phone] On Call: [phone] and follow prompts after 5 pm and on weekends Office Phone: [phone] Office Fax: [phone]

## 2017-07-27 ENCOUNTER — Encounter: Payer: Self-pay | Admitting: Internal Medicine

## 2017-08-10 ENCOUNTER — Non-Acute Institutional Stay (SKILLED_NURSING_FACILITY): Payer: Medicare Other | Admitting: Internal Medicine

## 2017-08-10 ENCOUNTER — Encounter: Payer: Self-pay | Admitting: Internal Medicine

## 2017-08-10 DIAGNOSIS — F0280 Dementia in other diseases classified elsewhere without behavioral disturbance: Secondary | ICD-10-CM

## 2017-08-10 DIAGNOSIS — F0150 Vascular dementia without behavioral disturbance: Secondary | ICD-10-CM | POA: Diagnosis not present

## 2017-08-10 DIAGNOSIS — Z8673 Personal history of transient ischemic attack (TIA), and cerebral infarction without residual deficits: Secondary | ICD-10-CM | POA: Diagnosis not present

## 2017-08-10 DIAGNOSIS — M7918 Myalgia, other site: Secondary | ICD-10-CM | POA: Diagnosis not present

## 2017-08-10 DIAGNOSIS — H04123 Dry eye syndrome of bilateral lacrimal glands: Secondary | ICD-10-CM | POA: Insufficient documentation

## 2017-08-10 DIAGNOSIS — G309 Alzheimer's disease, unspecified: Secondary | ICD-10-CM

## 2017-08-10 NOTE — Progress Notes (Signed)
Location:  [facility] Room Number: 39 Place of Service:  SNF [phone]) Provider:  Blanchie Serve MD  Blanchie Serve, MD  Patient Care Team: Blanchie Serve, MD as PCP - General (Internal Medicine) [place] Minus Breeding, MD as Consulting Physician (Cardiology) Nathanial Rancher, MD as Consulting Physician (Ophthalmology) Leta Baptist, MD as Consulting Physician (Otolaryngology) Mast, Man X, NP as Nurse Practitioner (Internal Medicine)  Extended Emergency Contact Information Primary Emergency Contact: Vinson Moselle of [place] Phone: [phone] Mobile Phone: [phone] Relation: Friend Secondary Emergency Contact: Daniel,Gloria Address: [place], [place] of [place] Phone: [phone] Mobile Phone: [phone] Relation: Friend  Code Status:  DNR Goals of care: Advanced Directive information Advanced Directives 08/10/2017  Does Patient Have a Medical Advance Directive? Yes  Type of Paramedic of [place];Living will;Out of facility DNR (pink MOST or yellow form)  Does patient want to make changes to medical advance directive? No - Patient declined  Copy of [place] in Chart? Yes  Pre-existing out of facility DNR order (yellow form or pink MOST form) Yellow form placed in chart (order not valid for inpatient use);Pink MOST form placed in chart (order not valid for inpatient use)     Chief Complaint  Patient presents with  . Medical Management of Chronic Issues    Routine Visit     HPI:  Pt is a 83 y.o. female seen today for medical management of chronic diseases. She complaints of pain to her left buttock x 1 day. Denies any fall or trauma. Denies radiation of the pain. Per nursing, she was reported of pain this am by her caregiver. No injury reported by nursing either. Patient has dementia and this limits her HPI and ROS. She needs  frequent redirection and gets agitated easily. No fall reported. She is compliant with her medications.    Past Medical History:  Diagnosis Date  . Alzheimer's disease 01/10/2015   MMSE 25/30  . Anxiety    mild  . Anxiety 01/11/2014  . Aortic stenosis    a. Echo 03/2010:  EF 70-75%, mild AS  . Cerebral atrophy 01/10/2015  . Cerebrovascular disease 11/01/2005   Small vessel disease  . Chronic neck pain 08/06/2011  . Closed pelvic fracture ([place]) 09/29/12   left nondisplaced inferior pelvic ramus  . Glaucoma    "both eyes; no vision in left eye as a result" (04/06/2013)  . History of recurrent UTIs   . Hypertension   . Osteoarthritis 08/06/2011  . Pneumonia 04/06/2013   "for the first time" (04/06/2013)  . PVC (premature ventricular contraction)   . Senile osteoporosis 04/08/2011  . SVT (supraventricular tachycardia) ([place])   . Thumb pain 08/06/2011  . Vision loss of left eye    "can't see at all out of it" (04/06/2013)   Past Surgical History:  Procedure Laterality Date  . APPENDECTOMY    . CATARACT EXTRACTION, BILATERAL Bilateral   . GLAUCOMA SURGERY Left    "put in stent for glaucoma" (04/06/2013)  . TONSILLECTOMY      Allergies  Allergen Reactions  . Codeine Nausea Only  . Sulfate     Outpatient Encounter Prescriptions as of 08/10/2017  Medication Sig  . acetaminophen (TYLENOL) 325 MG tablet Take 650 mg by mouth every 4 (four) hours as needed.   . Calcium Citrate-Vitamin D 315-250 MG-UNIT TABS Take 1 tablet by mouth daily.   Marland Kitchen  donepezil (ARICEPT) 5 MG tablet Take 5 mg by mouth at bedtime.  . feeding supplement (BOOST / RESOURCE BREEZE) LIQD Take 1 Container by mouth 2 (two) times daily.  Marland Kitchen ketorolac (ACULAR) 0.4 % SOLN Place 1 drop into the left eye 4 (four) times daily as needed.   . magnesium hydroxide (MILK OF MAGNESIA) 400 MG/5ML suspension Take 30 mLs by mouth at bedtime.  . memantine (NAMENDA) 10 MG tablet Take 10 mg by mouth 2 (two) times daily.  . polyethylene glycol  (MIRALAX / GLYCOLAX) packet Take 17 g by mouth daily as needed for mild constipation.  . polyvinyl alcohol (LIQUIFILM TEARS) 1.4 % ophthalmic solution Place 1 drop into both eyes 4 (four) times daily as needed.    No facility-administered encounter medications on file as of 08/10/2017.     Review of Systems  Constitutional: Negative for appetite change, chills and fever.  HENT: Negative for congestion, rhinorrhea, sore throat and trouble swallowing.   Eyes: Negative for pain.       No vision to left eye, has glaucoma  Respiratory: Negative for cough and shortness of breath.   Cardiovascular: Negative for chest pain.  Gastrointestinal: Negative for abdominal pain, constipation, diarrhea and nausea.  Genitourinary: Negative for dysuria.  Musculoskeletal: Positive for gait problem. Negative for back pain.       Needs minimal 1 person assist with transfer. Uses cane  Skin: Negative for rash and wound.  Neurological: Negative for tremors and seizures.       No fall reported  Psychiatric/Behavioral: Positive for agitation, behavioral problems and confusion.    Immunization History  Administered Date(s) Administered  . Influenza Split 09/22/2011, 09/13/2012  . Influenza-Unspecified 08/09/2013, 08/27/2014, 08/08/2015, 08/25/2016  . Tdap 09/13/2012   Pertinent  Health Maintenance Due  Topic Date Due  . INFLUENZA VACCINE  08/18/2017 (Originally 06/09/2017)  . PNA vac Low Risk Adult (1 of 2 - PCV13) 11/09/2017 (Originally [birth year]/1984)  . DEXA SCAN  05/06/2018 (Originally [birth year]/1984)   Fall Risk  06/29/2017 03/21/2015 08/23/2014 09/29/2012 09/29/2012  Falls in the past year? - No No No No  Number falls in past yr: 1 - - - -  Injury with Fall? No - - - -   Functional Status Survey:    Vitals:   08/10/17 1609  BP: (!) 120/58  Pulse: 60  Resp: 18  Temp: 98.1 F (36.7 C)  TempSrc: Oral  SpO2: 95%  Weight: 110 lb 4.8 oz (50 kg)  Height: 5\' 4"  (1.626 m)   Body mass index is 18.93 kg/m.     Wt Readings from Last 3 Encounters:  08/10/17 110 lb 4.8 oz (50 kg)  07/06/17 109 lb (49.4 kg)  07/01/17 109 lb (49.4 kg)    Physical Exam  Constitutional:  Frail, elderly, thin built, female, in no acute distress  HENT:  Head: Normocephalic and atraumatic.  Mouth/Throat: Oropharynx is clear and moist.  Eyes: Conjunctivae are normal.  Neck: Normal range of motion. Neck supple.  Cardiovascular: Normal rate and regular rhythm.   Murmur heard. systolic  Pulmonary/Chest: Effort normal and breath sounds normal. She has no wheezes. She has no rales.  Abdominal: Soft. Bowel sounds are normal. There is no tenderness. There is no guarding.  Musculoskeletal: She exhibits no edema.  Good ROM with left hip. No pain with SLR. No spine tenderness. Kyphosis present. Arthritis changes to her fingers. Can move all 4 extremities, gets aroud with her walker and needs minimal assist with transfers  Lymphadenopathy:  She has no cervical adenopathy.  Neurological: She is alert.  Oriented to self and place but not to time  Skin: Skin is warm and dry. No rash noted. She is not diaphoretic.  Psychiatric: She has a normal mood and affect.    Labs reviewed:  Recent Labs  01/12/17  NA 141  K 4.3  BUN 27*  CREATININE 1.1    Recent Labs  01/12/17  AST 13  ALT 10  ALKPHOS 56    Recent Labs  01/12/17  WBC 3.7  HGB 11.0*  HCT 34*  PLT 191   Lab Results  Component Value Date   TSH 2.28 06/11/2016   No results found for: HGBA1C Lab Results  Component Value Date   CHOL 207 (A) 08/28/2014   HDL 80 (A) 08/28/2014   LDLCALC 113 08/28/2014   TRIG 70 08/28/2014    Significant Diagnostic Results in last 30 days:  No results found.  Assessment/Plan  Left buttock pain Not producible on exam. Normal ROM for her age. No visible bruise. No spine tenderness. Denies radiation of pain. Monitor clinically. Continue tylenol 650 mg q4h prn pain. Offered tylenol but pt refuses this  visit.  Mixed vascular and alzheimer's dementia Continue donepezil 5 mg daily. Also on memantine 10 mg bid. Supportive care.   History of CVA Off medications, fall prevention. monitor clinically  Dry eyes Continue liquifilm tear drop. Continue eye care.    Family/ staff Communication: reviewed care plan with patient and charge nurse.    Labs/tests ordered: none   Blanchie Serve, MD Internal Medicine [facility] [address] [place], [place] [address] Cell Phone (Monday-Friday 8 am - 5 pm): [phone] On Call: [phone] and follow prompts after 5 pm and on weekends Office Phone: [phone] Office Fax: [phone]

## 2017-09-09 ENCOUNTER — Non-Acute Institutional Stay (SKILLED_NURSING_FACILITY): Payer: Medicare Other | Admitting: Nurse Practitioner

## 2017-09-09 DIAGNOSIS — G309 Alzheimer's disease, unspecified: Secondary | ICD-10-CM | POA: Diagnosis not present

## 2017-09-09 DIAGNOSIS — N3942 Incontinence without sensory awareness: Secondary | ICD-10-CM

## 2017-09-09 DIAGNOSIS — F0280 Dementia in other diseases classified elsewhere without behavioral disturbance: Secondary | ICD-10-CM | POA: Diagnosis not present

## 2017-09-09 DIAGNOSIS — I679 Cerebrovascular disease, unspecified: Secondary | ICD-10-CM

## 2017-09-09 DIAGNOSIS — K5909 Other constipation: Secondary | ICD-10-CM | POA: Diagnosis not present

## 2017-09-09 DIAGNOSIS — F0150 Vascular dementia without behavioral disturbance: Secondary | ICD-10-CM

## 2017-09-09 NOTE — Assessment & Plan Note (Addendum)
resides in SNF, takes Memantine and Donepzil 5mg  daily for memory, she is ambulates with walker on unit, transfer self independently, update CBC CMP TSH Vit D level.

## 2017-09-09 NOTE — Assessment & Plan Note (Signed)
Small vessel disease, update lipid panel.

## 2017-09-09 NOTE — Progress Notes (Signed)
Location:   SNF [facility] Room Number: 31 Place of Service:  SNF (31)SNF Provider: Lennie Odor Kejon Feild NP  Blanchie Serve, MD  Patient Care Team: Blanchie Serve, MD as PCP - General (Internal Medicine) [place] Minus Breeding, MD as Consulting Physician (Cardiology) Nathanial Rancher, MD as Consulting Physician (Ophthalmology) Leta Baptist, MD as Consulting Physician (Otolaryngology) Marilla Boddy X, NP as Nurse Practitioner (Internal Medicine)  Extended Emergency Contact Information Primary Emergency Contact: Vinson Moselle of [place] Phone: [phone] Mobile Phone: [phone] Relation: Friend Secondary Emergency Contact: Daniel,Gloria Address: [place], [place] of [place] Phone: [phone] Mobile Phone: [phone] Relation: Friend  Code Status:  DNR Goals of care: Advanced Directive information Advanced Directives 08/10/2017  Does Patient Have a Medical Advance Directive? Yes  Type of Paramedic of [place];Living will;Out of facility DNR (pink MOST or yellow form)  Does patient want to make changes to medical advance directive? No - Patient declined  Copy of [place] in Chart? Yes  Pre-existing out of facility DNR order (yellow form or pink MOST form) Yellow form placed in chart (order not valid for inpatient use);Pink MOST form placed in chart (order not valid for inpatient use)     No chief complaint on file.   HPI:  Pt is a 83 y.o. female seen today for medical management of chronic diseases.     The patient has history of dementia, resides in SNF, takes Memantine and Donepzil 5mg  daily for memory, she is ambulates with walker on unit, transfer self independently, she uses adult depends for urinary leakage.    Past Medical History:  Diagnosis Date  . Alzheimer's disease 01/10/2015   MMSE 25/30  . Anxiety    mild  . Anxiety 01/11/2014  .  Aortic stenosis    a. Echo 03/2010:  EF 70-75%, mild AS  . Cerebral atrophy 01/10/2015  . Cerebrovascular disease 11/01/2005   Small vessel disease  . Chronic neck pain 08/06/2011  . Closed pelvic fracture ([place]) 09/29/12   left nondisplaced inferior pelvic ramus  . Glaucoma    "both eyes; no vision in left eye as a result" (04/06/2013)  . History of recurrent UTIs   . Hypertension   . Osteoarthritis 08/06/2011  . Pneumonia 04/06/2013   "for the first time" (04/06/2013)  . PVC (premature ventricular contraction)   . Senile osteoporosis 04/08/2011  . SVT (supraventricular tachycardia) ([place])   . Thumb pain 08/06/2011  . Vision loss of left eye    "can't see at all out of it" (04/06/2013)   Past Surgical History:  Procedure Laterality Date  . APPENDECTOMY    . CATARACT EXTRACTION, BILATERAL Bilateral   . GLAUCOMA SURGERY Left    "put in stent for glaucoma" (04/06/2013)  . TONSILLECTOMY      Allergies  Allergen Reactions  . Codeine Nausea Only  . Sulfate     Allergies as of 09/09/2017      Reactions   Codeine Nausea Only   Sulfate       Medication List        Accurate as of 09/09/17 11:59 PM. Always use your most recent med list.          acetaminophen 325 MG tablet Commonly known as:  TYLENOL Take 650 mg by mouth every 4 (four) hours as needed.   Calcium Citrate-Vitamin D 315-250 MG-UNIT Tabs Take 1 tablet  by mouth daily.   donepezil 5 MG tablet Commonly known as:  ARICEPT Take 5 mg by mouth at bedtime.   feeding supplement Liqd Take 1 Container by mouth 2 (two) times daily.   ketorolac 0.4 % Soln Commonly known as:  ACULAR Place 1 drop into the left eye 4 (four) times daily as needed.   magnesium hydroxide 400 MG/5ML suspension Commonly known as:  MILK OF MAGNESIA Take 30 mLs by mouth at bedtime.   memantine 10 MG tablet Commonly known as:  NAMENDA Take 10 mg by mouth 2 (two) times daily.   polyethylene glycol packet Commonly known as:  MIRALAX /  GLYCOLAX Take 17 g by mouth daily as needed for mild constipation.   polyvinyl alcohol 1.4 % ophthalmic solution Commonly known as:  LIQUIFILM TEARS Place 1 drop into both eyes 4 (four) times daily as needed.       Review of Systems  Constitutional: Negative for activity change, appetite change, chills, diaphoresis, fatigue and fever.  HENT: Positive for hearing loss. Negative for congestion, trouble swallowing and voice change.   Eyes:       Legally blind left eye  Respiratory: Negative for cough, choking and shortness of breath.   Cardiovascular: Negative for chest pain, palpitations and leg swelling.  Gastrointestinal: Negative for abdominal distention, abdominal pain, constipation, diarrhea and nausea.  Endocrine: Negative for cold intolerance.  Genitourinary: Negative for difficulty urinating and dyspareunia.       Urinary leakage, uses adult depends.   Musculoskeletal: Positive for gait problem. Negative for back pain.       Independent transfer, ambulates with walker.   Skin: Negative for color change, pallor, rash and wound.  Neurological: Negative for tremors, speech difficulty, weakness and headaches.  Psychiatric/Behavioral: Negative for agitation, behavioral problems, hallucinations and sleep disturbance. The patient is not nervous/anxious.     Immunization History  Administered Date(s) Administered  . Influenza Split 09/22/2011, 09/13/2012  . Influenza-Unspecified 08/09/2013, 08/27/2014, 08/08/2015, 08/25/2016  . Tdap 09/13/2012   Pertinent  Health Maintenance Due  Topic Date Due  . INFLUENZA VACCINE  06/09/2017  . PNA vac Low Risk Adult (1 of 2 - PCV13) 11/09/2017 (Originally [birth year]03/1984)  . DEXA SCAN  05/06/2018 (Originally [birth year]03/1984)   Fall Risk  06/29/2017 03/21/2015 08/23/2014 09/29/2012 09/29/2012  Falls in the past year? - No No No No  Number falls in past yr: 1 - - - -  Injury with Fall? No - - - -   Functional Status Survey:    There were no vitals  filed for this visit. There is no height or weight on file to calculate BMI. Physical Exam  Constitutional: She is oriented to person, place, and time. She appears well-developed and well-nourished.  HENT:  Head: Normocephalic and atraumatic.  Eyes: Conjunctivae are normal. No scleral icterus.  Neck: Normal range of motion. Neck supple.  Cardiovascular: Normal rate and regular rhythm.   Murmur heard. Pulmonary/Chest: She has no wheezes. She has no rales.  Abdominal: Soft. Bowel sounds are normal. She exhibits no distension. There is no tenderness.  Musculoskeletal: Normal range of motion. She exhibits no edema or tenderness.  kyphosis  Neurological: She is alert and oriented to person, place, and time. She exhibits normal muscle tone. Coordination normal.  Skin: Skin is warm and dry.  Psychiatric: She has a normal mood and affect. Her behavior is normal. Judgment and thought content normal.    Labs reviewed: Recent Labs    01/12/17  NA 141  K 4.3  BUN 27*  CREATININE 1.1   Recent Labs    01/12/17  AST 13  ALT 10  ALKPHOS 56   Recent Labs    01/12/17  WBC 3.7  HGB 11.0*  HCT 34*  PLT 191   Lab Results  Component Value Date   TSH 2.28 06/11/2016   No results found for: HGBA1C Lab Results  Component Value Date   CHOL 207 (A) 08/28/2014   HDL 80 (A) 08/28/2014   LDLCALC 113 08/28/2014   TRIG 70 08/28/2014    Significant Diagnostic Results in last 30 days:  No results found.  Assessment/Plan Urine incontinence she uses adult depends for urinary leakage.     Mixed Alzheimer's and vascular dementia resides in SNF, takes Memantine and Donepzil 5mg  daily for memory, she is ambulates with walker on unit, transfer self independently, update CBC CMP TSH Vit D level.   Chronic constipation Stable, continue daily MOM, prn MiraLax.   Cerebrovascular disease Small vessel disease, update lipid panel.    Family/ staff Communication: plan of care reviewed with the  patient and charge nurse.   Labs/tests ordered: CBC CMP TSH Vit D lipid panel.   Time spend 25 minutes.

## 2017-09-09 NOTE — Assessment & Plan Note (Signed)
she uses adult depends for urinary leakage.

## 2017-09-09 NOTE — Assessment & Plan Note (Signed)
Stable, continue daily MOM, prn MiraLax.

## 2017-09-14 LAB — CBC AND DIFFERENTIAL
HEMATOCRIT: 34 — AB (ref 36–46)
HEMOGLOBIN: 11.8 — AB (ref 12.0–16.0)
PLATELETS: 169 (ref 150–399)
WBC: 3.4

## 2017-09-14 LAB — HEPATIC FUNCTION PANEL
ALT: 11 (ref 7–35)
AST: 14 (ref 13–35)
Alkaline Phosphatase: 54 (ref 25–125)
BILIRUBIN, TOTAL: 0.5

## 2017-09-14 LAB — BASIC METABOLIC PANEL
BUN: 29 — AB (ref 4–21)
Creatinine: 1.1 (ref <=1.1)
Glucose: 84
Potassium: 4.5 (ref 3.4–5.3)
SODIUM: 140 (ref 137–147)

## 2017-09-14 LAB — LIPID PANEL
CHOLESTEROL: 250 — AB (ref 0–200)
HDL: 95 — AB (ref 35–70)
LDL CALC: 140
LDl/HDL Ratio: 2.6
Triglycerides: 63 (ref 40–160)

## 2017-09-14 LAB — TSH: TSH: 2.34 (ref <=5.90)

## 2017-09-15 ENCOUNTER — Other Ambulatory Visit: Payer: Self-pay | Admitting: *Deleted

## 2017-09-15 ENCOUNTER — Encounter: Payer: Self-pay | Admitting: *Deleted

## 2017-09-15 NOTE — Telephone Encounter (Signed)
This encounter was created in error - please disregard.

## 2017-09-16 ENCOUNTER — Encounter: Payer: Self-pay | Admitting: Nurse Practitioner

## 2017-09-16 ENCOUNTER — Other Ambulatory Visit: Payer: Self-pay | Admitting: *Deleted

## 2017-09-16 ENCOUNTER — Non-Acute Institutional Stay (SKILLED_NURSING_FACILITY): Payer: Medicare Other | Admitting: Nurse Practitioner

## 2017-09-16 DIAGNOSIS — E782 Mixed hyperlipidemia: Secondary | ICD-10-CM | POA: Diagnosis not present

## 2017-09-16 DIAGNOSIS — E559 Vitamin D deficiency, unspecified: Secondary | ICD-10-CM | POA: Diagnosis not present

## 2017-09-16 NOTE — Assessment & Plan Note (Addendum)
09/15/17 Vit D 25, Ca 8.5 09/16/17 adding Vit D 1000 unit po qd. Increase  Ca/Vit D 315/250 po bid.

## 2017-09-16 NOTE — Assessment & Plan Note (Signed)
Mild, Ca 8.5 09/15/17, increase 09/15/17 Vit D 25, Ca 8.5 09/16/17 adding Vit D 1000 unit po qd. Increase Ca/Vit D 315/250 po bid.

## 2017-09-16 NOTE — Assessment & Plan Note (Signed)
09/15/17 cholesterol 250, triglycerides 63, LDL 140, adding Atorvastatin 20mg  po daily.

## 2017-09-16 NOTE — Progress Notes (Signed)
Location:  [facility] Room Number: 38 Place of Service:  SNF (31) Provider:  Zienna Ahlin, Manxie  NP  Blanchie Serve, MD  Patient Care Team: Blanchie Serve, MD as PCP - General (Internal Medicine) [place] Minus Breeding, MD as Consulting Physician (Cardiology) Nathanial Rancher, MD as Consulting Physician (Ophthalmology) Leta Baptist, MD as Consulting Physician (Otolaryngology) Bryley Kovacevic X, NP as Nurse Practitioner (Internal Medicine)  Extended Emergency Contact Information Primary Emergency Contact: Vinson Moselle of [place] Phone: [phone] Mobile Phone: [phone] Relation: Friend Secondary Emergency Contact: Daniel,Gloria Address: [place], [place] of [place] Phone: [phone] Mobile Phone: [phone] Relation: Friend  Code Status:  DNR Goals of care: Advanced Directive information Advanced Directives 08/10/2017  Does Patient Have a Medical Advance Directive? Yes  Type of Paramedic of [place];Living will;Out of facility DNR (pink MOST or yellow form)  Does patient want to make changes to medical advance directive? No - Patient declined  Copy of [place] in Chart? Yes  Pre-existing out of facility DNR order (yellow form or pink MOST form) Yellow form placed in chart (order not valid for inpatient use);Pink MOST form placed in chart (order not valid for inpatient use)     Chief Complaint  Patient presents with  . Acute Visit    Elevated cholesterol and Vitamin D definciency    HPI:  Pt is a 83 y.o. female seen today for an acute visit for vitamin deficiency, last Vit D level 25(30-100) , Ca 8.5 09/15/17,  on Ca/Vit D 315/250 daily supplement. She was also noted to have elevated lipids, 09/15/17 cholesterol 250, triglycerides 63, LDL 140   Past Medical History:  Diagnosis Date  . Alzheimer's disease 01/10/2015   MMSE  25/30  . Anxiety    mild  . Anxiety 01/11/2014  . Aortic stenosis    a. Echo 03/2010:  EF 70-75%, mild AS  . Cerebral atrophy 01/10/2015  . Cerebrovascular disease 11/01/2005   Small vessel disease  . Chronic neck pain 08/06/2011  . Closed pelvic fracture ([place]) 09/29/12   left nondisplaced inferior pelvic ramus  . Glaucoma    "both eyes; no vision in left eye as a result" (04/06/2013)  . History of recurrent UTIs   . Hypertension   . Osteoarthritis 08/06/2011  . Pneumonia 04/06/2013   "for the first time" (04/06/2013)  . PVC (premature ventricular contraction)   . Senile osteoporosis 04/08/2011  . SVT (supraventricular tachycardia) ([place])   . Thumb pain 08/06/2011  . Vision loss of left eye    "can't see at all out of it" (04/06/2013)   Past Surgical History:  Procedure Laterality Date  . APPENDECTOMY    . CATARACT EXTRACTION, BILATERAL Bilateral   . GLAUCOMA SURGERY Left    "put in stent for glaucoma" (04/06/2013)  . TONSILLECTOMY      Allergies  Allergen Reactions  . Codeine Nausea Only  . Sulfate     Outpatient Encounter Medications as of 09/16/2017  Medication Sig  . acetaminophen (TYLENOL) 325 MG tablet Take 650 mg by mouth every 4 (four) hours as needed.   . Calcium Citrate-Vitamin D 315-250 MG-UNIT TABS Take 1 tablet by mouth daily.   Marland Kitchen donepezil (ARICEPT) 5 MG tablet Take 5 mg by mouth at bedtime.  . feeding supplement (BOOST / RESOURCE BREEZE) LIQD Take 1 Container by mouth 2 (two) times daily.  Marland Kitchen  ketorolac (ACULAR) 0.4 % SOLN Place 1 drop into the left eye 4 (four) times daily as needed.   . magnesium hydroxide (MILK OF MAGNESIA) 400 MG/5ML suspension Take 30 mLs by mouth at bedtime.  . memantine (NAMENDA) 10 MG tablet Take 10 mg by mouth 2 (two) times daily.  . polyethylene glycol (MIRALAX / GLYCOLAX) packet Take 17 g by mouth daily as needed for mild constipation.  . polyvinyl alcohol (LIQUIFILM TEARS) 1.4 % ophthalmic solution Place 1 drop into both eyes 4 (four) times  daily as needed.    No facility-administered encounter medications on file as of 09/16/2017.     Review of Systems  Constitutional: Negative for activity change, appetite change, chills, diaphoresis, fatigue and fever.  HENT: Positive for hearing loss.   Cardiovascular: Negative for chest pain, palpitations and leg swelling.  Gastrointestinal: Negative for abdominal distention, abdominal pain, constipation, diarrhea, nausea and vomiting.  Musculoskeletal: Positive for arthralgias and gait problem. Negative for back pain, joint swelling, myalgias, neck pain and neck stiffness.  Neurological: Negative for dizziness, tremors, speech difficulty, weakness, numbness and headaches.  Psychiatric/Behavioral: Negative for agitation and behavioral problems.    Immunization History  Administered Date(s) Administered  . Influenza Split 09/22/2011, 09/13/2012  . Influenza-Unspecified 08/09/2013, 08/27/2014, 08/08/2015, 08/25/2016, 08/31/2017  . Tdap 09/13/2012   Pertinent  Health Maintenance Due  Topic Date Due  . HEMOGLOBIN A1C  01-04-1917  . FOOT EXAM  03/03/1929  . OPHTHALMOLOGY EXAM  03/03/1929  . URINE MICROALBUMIN  03/03/1929  . PNA vac Low Risk Adult (1 of 2 - PCV13) 11/09/2017 (Originally 03/03/1984)  . DEXA SCAN  05/06/2018 (Originally 03/03/1984)  . INFLUENZA VACCINE  Completed   Fall Risk  06/29/2017 03/21/2015 08/23/2014 09/29/2012 09/29/2012  Falls in the past year? - No No No No  Number falls in past yr: 1 - - - -  Injury with Fall? No - - - -   Functional Status Survey:    Vitals:   09/16/17 1318  BP: (!) 150/68  Pulse: 62  Resp: 20  Temp: 98.6 F (37 C)  Weight: 106 lb (48.1 kg)  Height: 5\' 4"  (1.626 m)   Body mass index is 18.19 kg/m. Physical Exam  Constitutional: She appears well-developed and well-nourished.  Neck: Normal range of motion. Neck supple. No JVD present. No thyromegaly present.  Cardiovascular: Normal rate, regular rhythm and normal heart sounds.    No murmur heard. Pulmonary/Chest: She has no wheezes. She has no rales.  Abdominal: Soft. Bowel sounds are normal.  Musculoskeletal: Normal range of motion. She exhibits no edema or tenderness.  kyphosis  Neurological: She is alert.  Skin: Skin is warm and dry.  Psychiatric: She has a normal mood and affect.    Labs reviewed: Recent Labs    01/12/17 09/14/17  NA 141 140  K 4.3 4.5  BUN 27* 29*  CREATININE 1.1 1.1   Recent Labs    01/12/17 09/14/17  AST 13 14  ALT 10 11  ALKPHOS 56 54   Recent Labs    01/12/17 09/14/17  WBC 3.7 3.4  HGB 11.0* 11.8*  HCT 34* 34*  PLT 191 169   Lab Results  Component Value Date   TSH 2.34 09/14/2017   No results found for: HGBA1C Lab Results  Component Value Date   CHOL 250 (A) 09/14/2017   HDL 95 (A) 09/14/2017   LDLCALC 140 09/14/2017   TRIG 63 09/14/2017    Significant Diagnostic Results in last 30 days:  No results found.  Assessment/Plan Hyperlipidemia, mixed 09/15/17 cholesterol 250, triglycerides 63, LDL 140, adding Atorvastatin 20mg  po daily.   Vitamin D deficiency, unspecified 09/15/17 Vit D 25, Ca 8.5 09/16/17 adding Vit D 1000 unit po qd. Increase  Ca/Vit D 315/250 po bid.   Hypocalcemia Mild, Ca 8.5 09/15/17, increase 09/15/17 Vit D 25, Ca 8.5 09/16/17 adding Vit D 1000 unit po qd. Increase Ca/Vit D 315/250 po bid.       Family/ staff Communication: plan of care reviewed with the patient and charge nurse.   Labs/tests ordered:  None  Time spend 25 minutes.

## 2017-10-12 ENCOUNTER — Encounter: Payer: Self-pay | Admitting: Internal Medicine

## 2017-10-12 NOTE — Progress Notes (Signed)
This encounter was created in error - please disregard.

## 2017-10-14 ENCOUNTER — Non-Acute Institutional Stay (SKILLED_NURSING_FACILITY): Payer: Medicare Other | Admitting: Internal Medicine

## 2017-10-14 ENCOUNTER — Encounter: Payer: Self-pay | Admitting: Internal Medicine

## 2017-10-14 DIAGNOSIS — F0150 Vascular dementia without behavioral disturbance: Secondary | ICD-10-CM | POA: Diagnosis not present

## 2017-10-14 DIAGNOSIS — H04123 Dry eye syndrome of bilateral lacrimal glands: Secondary | ICD-10-CM | POA: Diagnosis not present

## 2017-10-14 DIAGNOSIS — F0280 Dementia in other diseases classified elsewhere without behavioral disturbance: Secondary | ICD-10-CM | POA: Diagnosis not present

## 2017-10-14 DIAGNOSIS — I10 Essential (primary) hypertension: Secondary | ICD-10-CM

## 2017-10-14 DIAGNOSIS — E440 Moderate protein-calorie malnutrition: Secondary | ICD-10-CM | POA: Diagnosis not present

## 2017-10-14 DIAGNOSIS — E782 Mixed hyperlipidemia: Secondary | ICD-10-CM | POA: Diagnosis not present

## 2017-10-14 DIAGNOSIS — G309 Alzheimer's disease, unspecified: Secondary | ICD-10-CM | POA: Diagnosis not present

## 2017-10-14 NOTE — Progress Notes (Signed)
Location:  [facility] Room Number: 47 Place of Service:  SNF [phone]) Provider:  Blanchie Serve MD  Blanchie Serve, MD  Patient Care Team: Blanchie Serve, MD as PCP - General (Internal Medicine) [place] Minus Breeding, MD as Consulting Physician (Cardiology) Nathanial Rancher, MD as Consulting Physician (Ophthalmology) Leta Baptist, MD as Consulting Physician (Otolaryngology) Mast, Man X, NP as Nurse Practitioner (Internal Medicine)  Extended Emergency Contact Information Primary Emergency Contact: Vinson Moselle of [place] Phone: [phone] Mobile Phone: [phone] Relation: Friend Secondary Emergency Contact: Daniel,Gloria Address: [place], [place] of [place] Phone: [phone] Mobile Phone: [phone] Relation: Friend  Code Status:  DNR  Goals of care: Advanced Directive information Advanced Directives 08/10/2017  Does Patient Have a Medical Advance Directive? Yes  Type of Paramedic of [place];Living will;Out of facility DNR (pink MOST or yellow form)  Does patient want to make changes to medical advance directive? No - Patient declined  Copy of [place] in Chart? Yes  Pre-existing out of facility DNR order (yellow form or pink MOST form) Yellow form placed in chart (order not valid for inpatient use);Pink MOST form placed in chart (order not valid for inpatient use)     Chief Complaint  Patient presents with  . Medical Management of Chronic Issues    Routine Visit     HPI:  Pt is a 83 y.o. female seen today for medical management of chronic diseases. She is reading her paper this morning. She denies any health concern this visit. No new concern from nursing. She is hard of hearing but does not use a hearing aid. She requires supervision with transfer, dressing and toileting. She needs extensive assistance with  bathing. No fall reported.    Past Medical History:  Diagnosis Date  . Alzheimer's disease 01/10/2015   MMSE 25/30  . Anxiety    mild  . Anxiety 01/11/2014  . Aortic stenosis    a. Echo 03/2010:  EF 70-75%, mild AS  . Cerebral atrophy 01/10/2015  . Cerebrovascular disease 11/01/2005   Small vessel disease  . Chronic neck pain 08/06/2011  . Closed pelvic fracture ([place]) 09/29/12   left nondisplaced inferior pelvic ramus  . Glaucoma    "both eyes; no vision in left eye as a result" (04/06/2013)  . History of recurrent UTIs   . Hypertension   . Osteoarthritis 08/06/2011  . Pneumonia 04/06/2013   "for the first time" (04/06/2013)  . PVC (premature ventricular contraction)   . Senile osteoporosis 04/08/2011  . SVT (supraventricular tachycardia) ([place])   . Thumb pain 08/06/2011  . Vision loss of left eye    "can't see at all out of it" (04/06/2013)   Past Surgical History:  Procedure Laterality Date  . APPENDECTOMY    . CATARACT EXTRACTION, BILATERAL Bilateral   . GLAUCOMA SURGERY Left    "put in stent for glaucoma" (04/06/2013)  . TONSILLECTOMY      Allergies  Allergen Reactions  . Codeine Nausea Only  . Sulfate     Outpatient Encounter Medications as of 10/14/2017  Medication Sig  . acetaminophen (TYLENOL) 325 MG tablet Take 650 mg by mouth every 4 (four) hours as needed.   Marland Kitchen atorvastatin (LIPITOR) 20 MG tablet Take 20 mg by mouth daily.  . Calcium Citrate-Vitamin D 315-250 MG-UNIT TABS Take 1 tablet by mouth 2 (two) times daily  as needed.   . Cholecalciferol 1000 units tablet Take 1,000 Units by mouth daily.  Marland Kitchen donepezil (ARICEPT) 5 MG tablet Take 5 mg by mouth at bedtime.  . feeding supplement (BOOST / RESOURCE BREEZE) LIQD Take 1 Container by mouth 2 (two) times daily.  Marland Kitchen ketorolac (ACULAR) 0.4 % SOLN Place 1 drop into the left eye 4 (four) times daily as needed.   . magnesium hydroxide (MILK OF MAGNESIA) 400 MG/5ML suspension Take 30 mLs by mouth at bedtime.  . memantine  (NAMENDA) 10 MG tablet Take 10 mg by mouth 2 (two) times daily.  . polyethylene glycol (MIRALAX / GLYCOLAX) packet Take 17 g by mouth daily as needed for mild constipation.  . polyvinyl alcohol (LIQUIFILM TEARS) 1.4 % ophthalmic solution Place 1 drop into both eyes 4 (four) times daily as needed.    No facility-administered encounter medications on file as of 10/14/2017.     Review of Systems  Constitutional: Negative for appetite change and fever.  HENT: Positive for hearing loss. Negative for mouth sores, sore throat and trouble swallowing.   Eyes: Positive for visual disturbance.       Blind to left eye.  Respiratory: Negative for cough and shortness of breath.   Cardiovascular: Negative for chest pain and palpitations.  Gastrointestinal: Negative for abdominal pain, constipation, diarrhea, nausea and vomiting.  Genitourinary: Negative for dysuria and flank pain.  Musculoskeletal: Positive for gait problem. Negative for back pain.       Ambulates with walker for short distance and wheelchair for longer distance  Skin: Negative for rash and wound.  Neurological: Negative for dizziness, seizures and headaches.  Psychiatric/Behavioral: Positive for confusion. Negative for behavioral problems.    Immunization History  Administered Date(s) Administered  . Influenza Split 09/22/2011, 09/13/2012  . Influenza-Unspecified 08/09/2013, 08/27/2014, 08/08/2015, 08/25/2016, 08/31/2017  . Tdap 09/13/2012   Pertinent  Health Maintenance Due  Topic Date Due  . HEMOGLOBIN A1C  [birth year]19[birth year]  . FOOT EXAM  03/03/1929  . OPHTHALMOLOGY EXAM  03/03/1929  . URINE MICROALBUMIN  03/03/1929  . PNA vac Low Risk Adult (1 of 2 - PCV13) 11/09/2017 (Originally 03/03/1984)  . DEXA SCAN  05/06/2018 (Originally 03/03/1984)  . INFLUENZA VACCINE  Completed   Fall Risk  06/29/2017 03/21/2015 08/23/2014 09/29/2012 09/29/2012  Falls in the past year? - No No No No  Number falls in past yr: 1 - - - -  Injury with Fall?  No - - - -   Functional Status Survey:    Vitals:   10/14/17 1008  BP: (!) 120/54  Pulse: 80  Resp: 18  Temp: (!) 96.1 F (35.6 C)  TempSrc: Oral  SpO2: 95%  Weight: 112 lb 8 oz (51 kg)  Height: 5\' 4"  (1.626 m)   Body mass index is 19.31 kg/m.   Wt Readings from Last 3 Encounters:  10/14/17 112 lb 8 oz (51 kg)  10/12/17 112 lb 8 oz (51 kg)  09/16/17 106 lb (48.1 kg)   Physical Exam  Constitutional: No distress.  Thin built, frail, elderly female in no acute distress  HENT:  Head: Normocephalic and atraumatic.  Nose: Nose normal.  Mouth/Throat: Oropharynx is clear and moist. No oropharyngeal exudate.  Eyes: Conjunctivae are normal. Right eye exhibits no discharge. Left eye exhibits no discharge.  Neck: Normal range of motion. Neck supple.  Cardiovascular: Normal rate and regular rhythm.  Murmur heard. Pulmonary/Chest: Effort normal and breath sounds normal. No respiratory distress. She has no wheezes. She has no  rales. She exhibits no tenderness.  Abdominal: Soft. Bowel sounds are normal. There is no tenderness. There is no guarding.  Musculoskeletal: She exhibits deformity. She exhibits no edema.  Unsteady gait, uses walker and wheelchair, arthritis changes to her fingers, can move all 4 extremities, strength adequate  Lymphadenopathy:    She has no cervical adenopathy.  Neurological: She is alert.  Oriented to self and place only 06/29/17 MMSE 19/30  Skin: Skin is warm and dry. No rash noted. She is not diaphoretic.  Psychiatric: She has a normal mood and affect.    Labs reviewed: Recent Labs    01/12/17 09/14/17  NA 141 140  K 4.3 4.5  BUN 27* 29*  CREATININE 1.1 1.1   Recent Labs    01/12/17 09/14/17  AST 13 14  ALT 10 11  ALKPHOS 56 54   Recent Labs    01/12/17 09/14/17  WBC 3.7 3.4  HGB 11.0* 11.8*  HCT 34* 34*  PLT 191 169   Lab Results  Component Value Date   TSH 2.34 09/14/2017   No results found for: HGBA1C Lab Results  Component  Value Date   CHOL 250 (A) 09/14/2017   HDL 95 (A) 09/14/2017   LDLCALC 140 09/14/2017   TRIG 63 09/14/2017    Significant Diagnostic Results in last 30 days:  No results found.  Assessment/Plan  Dementia MMSE 8/18 reviewed. Continue donepezil 5 mg qhs and memantine 10 mg bid.  Hyperlipidemia Elevated LDL and total cholesterol. Reviewed lipid panel. C/w atorvastatin 20 mg daily.   Dry eyes Continue liquifilm tears ee drops qid prn and monitor  Essential hypertension Controlled BP overall, monitor clinically, BMP reviewed.   Protein calorie malnutrition Low but stable weight. Monitor po intake. With her dementia, decline anticipated.   Pt will need goals of care meeting to go over MOST form. Social worker to help set up this appointment.    Family/ staff Communication: reviewed care plan with patient and charge nurse.    Labs/tests ordered:  none   Blanchie Serve, MD Internal Medicine [facility] [address] [place], [place] [address] Cell Phone (Monday-Friday 8 am - 5 pm): [phone] On Call: [phone] and follow prompts after 5 pm and on weekends Office Phone: [phone] Office Fax: [phone]

## 2017-11-11 ENCOUNTER — Non-Acute Institutional Stay (SKILLED_NURSING_FACILITY): Payer: Medicare Other | Admitting: Nurse Practitioner

## 2017-11-11 ENCOUNTER — Encounter: Payer: Self-pay | Admitting: Nurse Practitioner

## 2017-11-11 DIAGNOSIS — G301 Alzheimer's disease with late onset: Secondary | ICD-10-CM | POA: Diagnosis not present

## 2017-11-11 DIAGNOSIS — F0280 Dementia in other diseases classified elsewhere without behavioral disturbance: Secondary | ICD-10-CM | POA: Diagnosis not present

## 2017-11-11 DIAGNOSIS — E782 Mixed hyperlipidemia: Secondary | ICD-10-CM | POA: Diagnosis not present

## 2017-11-11 LAB — BASIC METABOLIC PANEL
BUN: 33 — AB (ref 4–21)
CREATININE: 0.7 (ref 0.5–1.1)
GLUCOSE: 114
POTASSIUM: 4.5 (ref 3.4–5.3)
Sodium: 139 (ref 137–147)

## 2017-11-11 LAB — CBC AND DIFFERENTIAL
HCT: 37 (ref 36–46)
Hemoglobin: 12.6 (ref 12.0–16.0)
PLATELETS: 320 (ref 150–399)
WBC: 5.7

## 2017-11-11 LAB — HEPATIC FUNCTION PANEL
ALT: 10 (ref 7–35)
AST: 17 (ref 13–35)
Alkaline Phosphatase: 63 (ref 25–125)
Bilirubin, Total: 0.4

## 2017-11-11 NOTE — Progress Notes (Addendum)
Location:  [facility] Room Number: 60 Place of Service:  SNF (31) Provider: Lennie Odor Dominyk Law NP  Blanchie Serve, MD  Patient Care Team: Blanchie Serve, MD as PCP - General (Internal Medicine) [place] Minus Breeding, MD as Consulting Physician (Cardiology) Nathanial Rancher, MD as Consulting Physician (Ophthalmology) Leta Baptist, MD as Consulting Physician (Otolaryngology) Maximiliano Cromartie X, NP as Nurse Practitioner (Internal Medicine)  Extended Emergency Contact Information Primary Emergency Contact: Vinson Moselle of [place] Phone: [phone] Mobile Phone: [phone] Relation: Friend Secondary Emergency Contact: Daniel,Gloria Address: [place], [place] of [place] Phone: [phone] Mobile Phone: [phone] Relation: Friend Code status: DNR Goals of care: Advanced Directive information Advanced Directives 11/11/2017  Does Patient Have a Medical Advance Directive? Yes  Type of Paramedic of [place];Out of facility DNR (pink MOST or yellow form);Living will  Does patient want to make changes to medical advance directive? No - Patient declined  Copy of [place] in Chart? Yes  Pre-existing out of facility DNR order (yellow form or pink MOST form) Yellow form placed in chart (order not valid for inpatient use);Pink MOST form placed in chart (order not valid for inpatient use)     Chief Complaint  Patient presents with  . Medical Management of Chronic Issues    HPI:  Pt is a 84 y.o. female seen today for medical management of chronic diseases.     The patient has history of dementia, resides in SNF, ambulates with walker, transfers self, on Memantine and Donepezil for memory. She has low vision, supervision needed for safety. Her cholesterol is well controlled on Atorvastatin 20mg  daily.    Past Medical History:  Diagnosis Date  .  Alzheimer's disease 01/10/2015   MMSE 25/30  . Anxiety    mild  . Anxiety 01/11/2014  . Aortic stenosis    a. Echo 03/2010:  EF 70-75%, mild AS  . Cerebral atrophy 01/10/2015  . Cerebrovascular disease 11/01/2005   Small vessel disease  . Chronic neck pain 08/06/2011  . Closed pelvic fracture ([place]) 09/29/12   left nondisplaced inferior pelvic ramus  . Glaucoma    "both eyes; no vision in left eye as a result" (04/06/2013)  . History of recurrent UTIs   . Hypertension   . Osteoarthritis 08/06/2011  . Pneumonia 04/06/2013   "for the first time" (04/06/2013)  . PVC (premature ventricular contraction)   . Senile osteoporosis 04/08/2011  . SVT (supraventricular tachycardia) ([place])   . Thumb pain 08/06/2011  . Vision loss of left eye    "can't see at all out of it" (04/06/2013)   Past Surgical History:  Procedure Laterality Date  . APPENDECTOMY    . CATARACT EXTRACTION, BILATERAL Bilateral   . GLAUCOMA SURGERY Left    "put in stent for glaucoma" (04/06/2013)  . TONSILLECTOMY      Allergies  Allergen Reactions  . Codeine Nausea Only  . Sulfate     Outpatient Encounter Medications as of 11/11/2017  Medication Sig  . acetaminophen (TYLENOL) 325 MG tablet Take 650 mg by mouth every 4 (four) hours as needed.   Marland Kitchen atorvastatin (LIPITOR) 20 MG tablet Take 20 mg by mouth daily.  . Calcium Citrate-Vitamin D 315-250 MG-UNIT TABS Take 1 tablet by mouth 2 (two) times daily as needed.   . donepezil (ARICEPT) 5 MG tablet Take 5 mg by mouth at bedtime.  Marland Kitchen  feeding supplement (BOOST / RESOURCE BREEZE) LIQD Take 1 Container by mouth 2 (two) times daily.  Marland Kitchen ketorolac (ACULAR) 0.4 % SOLN Place 1 drop into the left eye 4 (four) times daily as needed.   . magnesium hydroxide (MILK OF MAGNESIA) 400 MG/5ML suspension Take 30 mLs by mouth at bedtime.  . memantine (NAMENDA) 10 MG tablet Take 10 mg by mouth 2 (two) times daily.  . polyethylene glycol (MIRALAX / GLYCOLAX) packet Take 17 g by mouth daily as needed for  mild constipation.  . polyvinyl alcohol (LIQUIFILM TEARS) 1.4 % ophthalmic solution Place 1 drop into both eyes 4 (four) times daily as needed.   . [DISCONTINUED] Cholecalciferol 1000 units tablet Take 1,000 Units by mouth daily.   No facility-administered encounter medications on file as of 11/11/2017.    ROS was provided with assistance of staff Review of Systems  Constitutional: Negative for activity change, appetite change, chills, diaphoresis and fatigue.  HENT: Positive for hearing loss. Negative for congestion, trouble swallowing and voice change.   Eyes:       Low vision, left eye sees light  Respiratory: Negative for cough, choking, chest tightness, shortness of breath and wheezing.   Gastrointestinal: Negative for abdominal distention, abdominal pain, constipation, diarrhea, nausea and vomiting.  Endocrine: Negative for cold intolerance.  Genitourinary: Negative for difficulty urinating, dysuria and urgency.       Uses commode, but urinary leakage sometimes, uses adult depends  Musculoskeletal: Positive for gait problem.  Skin: Negative for color change, pallor, rash and wound.  Neurological: Negative for tremors, speech difficulty, weakness and headaches.       Dementia  Psychiatric/Behavioral: Positive for confusion. Negative for agitation, behavioral problems, hallucinations and sleep disturbance. The patient is not nervous/anxious.     Immunization History  Administered Date(s) Administered  . Influenza Split 09/22/2011, 09/13/2012  . Influenza-Unspecified 08/09/2013, 08/27/2014, 08/08/2015, 08/25/2016, 08/31/2017  . Tdap 09/13/2012   Pertinent  Health Maintenance Due  Topic Date Due  . HEMOGLOBIN A1C  01/22/19  . FOOT EXAM  03/03/1929  . OPHTHALMOLOGY EXAM  03/03/1929  . URINE MICROALBUMIN  03/03/1929  . PNA vac Low Risk Adult (1 of 2 - PCV13) 03/03/1984  . DEXA SCAN  05/06/2018 (Originally 03/03/1984)  . INFLUENZA VACCINE  Completed   Fall Risk  06/29/2017  03/21/2015 08/23/2014 09/29/2012 09/29/2012  Falls in the past year? - No No No No  Number falls in past yr: 1 - - - -  Injury with Fall? No - - - -   Functional Status Survey:    Vitals:   11/11/17 1221  BP: (!) 146/64  Pulse: 68  Resp: 20  Temp: (!) 97.5 F (36.4 C)  Weight: 112 lb 1.6 oz (50.8 kg)  Height: 5\' 4"  (1.626 m)   Body mass index is 19.24 kg/m. Physical Exam  Constitutional: She appears well-developed and well-nourished. No distress.  HENT:  Head: Normocephalic and atraumatic.  Eyes:  Low vision, see light from the left eye  Neck: Normal range of motion. Neck supple. No JVD present. No thyromegaly present.  Cardiovascular: Normal rate and regular rhythm.  Murmur heard. Pulmonary/Chest: Effort normal and breath sounds normal. She has no wheezes. She has no rales.  Abdominal: Soft. Bowel sounds are normal. She exhibits no distension. There is no tenderness. There is no rebound.  Musculoskeletal: Normal range of motion. She exhibits no edema or tenderness.  Kyphosis, ambulates with walker, transfers self.   Neurological: She is alert. She exhibits normal muscle  tone. Coordination normal.  Oriented to person and place.   Skin: Skin is warm and dry. She is not diaphoretic.  Psychiatric: She has a normal mood and affect. Her behavior is normal.    Labs reviewed: Recent Labs    01/12/17 09/14/17  NA 141 140  K 4.3 4.5  BUN 27* 29*  CREATININE 1.1 1.1   Recent Labs    01/12/17 09/14/17  AST 13 14  ALT 10 11  ALKPHOS 56 54   Recent Labs    01/12/17 09/14/17  WBC 3.7 3.4  HGB 11.0* 11.8*  HCT 34* 34*  PLT 191 169   Lab Results  Component Value Date   TSH 2.34 09/14/2017   No results found for: HGBA1C Lab Results  Component Value Date   CHOL 250 (A) 09/14/2017   HDL 95 (A) 09/14/2017   LDLCALC 140 09/14/2017   TRIG 63 09/14/2017    Significant Diagnostic Results in last 30 days:  No results found.  Assessment/Plan Alzheimer's disease The  patient has history of dementia, resides in SNF, ambulates with walker, transfers self, on Memantine and Donepezil for memory. She has low vision, supervision needed for safety.     Hyperlipidemia, mixed cholesterol is not well controlled on Atorvastatin 20mg  daily. Last LDL was 140 09/15/17.      Family/ staff Communication: plan of care reviewed with the patient and charge nurse  Labs/tests ordered:  none  Time spend 25 minutes.

## 2017-11-12 NOTE — Assessment & Plan Note (Signed)
cholesterol is not well controlled on Atorvastatin 20mg  daily. Last LDL was 140 09/15/17.

## 2017-11-12 NOTE — Assessment & Plan Note (Signed)
The patient has history of dementia, resides in SNF, ambulates with walker, transfers self, on Memantine and Donepezil for memory. She has low vision, supervision needed for safety.

## 2017-12-23 ENCOUNTER — Non-Acute Institutional Stay (SKILLED_NURSING_FACILITY): Payer: Medicare Other | Admitting: Internal Medicine

## 2017-12-23 ENCOUNTER — Encounter: Payer: Self-pay | Admitting: Internal Medicine

## 2017-12-23 DIAGNOSIS — R2681 Unsteadiness on feet: Secondary | ICD-10-CM

## 2017-12-23 DIAGNOSIS — G301 Alzheimer's disease with late onset: Secondary | ICD-10-CM | POA: Diagnosis not present

## 2017-12-23 DIAGNOSIS — F0280 Dementia in other diseases classified elsewhere without behavioral disturbance: Secondary | ICD-10-CM

## 2017-12-23 DIAGNOSIS — K5900 Constipation, unspecified: Secondary | ICD-10-CM | POA: Diagnosis not present

## 2017-12-23 DIAGNOSIS — E782 Mixed hyperlipidemia: Secondary | ICD-10-CM

## 2017-12-23 NOTE — Progress Notes (Signed)
Location:  [facility] Room Number: 15 Place of Service:  SNF [phone]) Provider:  Blanchie Serve MD  Blanchie Serve, MD  Patient Care Team: Blanchie Serve, MD as PCP - General (Internal Medicine) [place] Minus Breeding, MD as Consulting Physician (Cardiology) Nathanial Rancher, MD as Consulting Physician (Ophthalmology) Leta Baptist, MD as Consulting Physician (Otolaryngology) Mast, Man X, NP as Nurse Practitioner (Internal Medicine)  Extended Emergency Contact Information Primary Emergency Contact: Vinson Moselle of [employer] Phone: [phone] Mobile Phone: [phone] Relation: Friend Secondary Emergency Contact: Daniel,Gloria Address: [place], [place] of [place] Phone: [phone] Mobile Phone: [phone] Relation: Friend  Code Status:  DNR  Goals of care: Advanced Directive information Advanced Directives 12/23/2017  Does Patient Have a Medical Advance Directive? Yes  Type of Paramedic of [place];Living will;Out of facility DNR (pink MOST or yellow form)  Does patient want to make changes to medical advance directive? No - Patient declined  Copy of [place] in Chart? Yes  Pre-existing out of facility DNR order (yellow form or pink MOST form) Yellow form placed in chart (order not valid for inpatient use);Pink MOST form placed in chart (order not valid for inpatient use)     Chief Complaint  Patient presents with  . Medical Management of Chronic Issues    Routine Visit     HPI:  Pt is a 84 y.o. female seen today for medical management of chronic diseases. She denies any concerns. She mentions getting old and having too much free time is boring. No acute concern from nursing. She has dementia and is alert and oriented to person only. She is continent with bowel and bladder.    Past Medical History:  Diagnosis Date  .  Alzheimer's disease 01/10/2015   MMSE 25/30  . Anxiety    mild  . Anxiety 01/11/2014  . Aortic stenosis    a. Echo 03/2010:  EF 70-75%, mild AS  . Cerebral atrophy 01/10/2015  . Cerebrovascular disease 11/01/2005   Small vessel disease  . Chronic neck pain 08/06/2011  . Closed pelvic fracture ([place]) 09/29/12   left nondisplaced inferior pelvic ramus  . Glaucoma    "both eyes; no vision in left eye as a result" (04/06/2013)  . History of recurrent UTIs   . Hypertension   . Osteoarthritis 08/06/2011  . Pneumonia 04/06/2013   "for the first time" (04/06/2013)  . PVC (premature ventricular contraction)   . Senile osteoporosis 04/08/2011  . SVT (supraventricular tachycardia) ([place])   . Thumb pain 08/06/2011  . Vision loss of left eye    "can't see at all out of it" (04/06/2013)   Past Surgical History:  Procedure Laterality Date  . APPENDECTOMY    . CATARACT EXTRACTION, BILATERAL Bilateral   . GLAUCOMA SURGERY Left    "put in stent for glaucoma" (04/06/2013)  . TONSILLECTOMY      Allergies  Allergen Reactions  . Codeine Nausea Only  . Sulfate     Outpatient Encounter Medications as of 12/23/2017  Medication Sig  . acetaminophen (TYLENOL) 325 MG tablet Take 650 mg by mouth every 4 (four) hours as needed.   Marland Kitchen atorvastatin (LIPITOR) 20 MG tablet Take 20 mg by mouth daily.  . Calcium Citrate-Vitamin D 315-250 MG-UNIT TABS Take 1 tablet by mouth 2 (two) times daily.   . cholecalciferol (VITAMIN D) 1000 units tablet Take  1,000 Units by mouth daily.  Marland Kitchen donepezil (ARICEPT) 5 MG tablet Take 5 mg by mouth at bedtime.  . feeding supplement (BOOST / RESOURCE BREEZE) LIQD Take 1 Container by mouth 2 (two) times daily.  Marland Kitchen ketorolac (ACULAR) 0.4 % SOLN Place 1 drop into the left eye 4 (four) times daily as needed.   . magnesium hydroxide (MILK OF MAGNESIA) 400 MG/5ML suspension Take 30 mLs by mouth at bedtime.  . memantine (NAMENDA) 10 MG tablet Take 10 mg by mouth 2 (two) times daily.  . polyethylene  glycol (MIRALAX / GLYCOLAX) packet Take 17 g by mouth daily as needed for mild constipation.  . polyvinyl alcohol (LIQUIFILM TEARS) 1.4 % ophthalmic solution Place 1 drop into both eyes 4 (four) times daily as needed.    No facility-administered encounter medications on file as of 12/23/2017.     Review of Systems  Constitutional: Negative for appetite change, fatigue and fever.  HENT: Negative for congestion, mouth sores, sinus pressure and trouble swallowing.   Respiratory: Negative for cough and shortness of breath.   Cardiovascular: Negative for chest pain and palpitations.  Gastrointestinal: Negative for abdominal pain, constipation, diarrhea, nausea and vomiting.  Genitourinary: Negative for dysuria.  Musculoskeletal: Positive for gait problem. Negative for back pain.  Skin: Negative for rash.  Neurological: Negative for dizziness and headaches.  Psychiatric/Behavioral: Positive for confusion.    Immunization History  Administered Date(s) Administered  . Influenza Split 09/22/2011, 09/13/2012  . Influenza-Unspecified 08/09/2013, 08/27/2014, 08/08/2015, 08/25/2016, 08/31/2017  . Tdap 09/13/2012   Pertinent  Health Maintenance Due  Topic Date Due  . HEMOGLOBIN A1C  1919[birth year]  . FOOT EXAM  [birth year]03[birth year]1929  . OPHTHALMOLOGY EXAM  03/03/1929  . URINE MICROALBUMIN  03/03/1929  . PNA vac Low Risk Adult (1 of 2 - PCV13) 03/03/1984  . DEXA SCAN  05/06/2018 (Originally 03/03/1984)  . INFLUENZA VACCINE  Completed   Fall Risk  06/29/2017 03/21/2015 08/23/2014 09/29/2012 09/29/2012  Falls in the past year? - No No No No  Number falls in past yr: 1 - - - -  Injury with Fall? No - - - -   Functional Status Survey:    Vitals:   12/23/17 1209  BP: (!) 160/70  Pulse: 76  Resp: 17  Temp: 97.8 F (36.6 C)  TempSrc: Oral  SpO2: 95%  Weight: 112 lb 11.2 oz (51.1 kg)  Height: 5\' 4"  (1.626 m)   Body mass index is 19.34 kg/m.   Wt Readings from Last 3 Encounters:  12/23/17 112 lb 11.2  oz (51.1 kg)  11/11/17 112 lb 1.6 oz (50.8 kg)  10/14/17 112 lb 8 oz (51 kg)   Physical Exam  Constitutional: No distress.  Thin built and frail  HENT:  Head: Normocephalic and atraumatic.  Mouth/Throat: Oropharynx is clear and moist. No oropharyngeal exudate.  Eyes: Conjunctivae and EOM are normal. Pupils are equal, round, and reactive to light.  Neck: Neck supple.  Cardiovascular: Normal rate and regular rhythm.  Pulmonary/Chest: Effort normal and breath sounds normal.  Abdominal: Soft. Bowel sounds are normal. There is no tenderness.  Musculoskeletal: Normal range of motion. She exhibits no edema.  Able to move all 4 extremities, uses walker for ambulation  Lymphadenopathy:    She has no cervical adenopathy.  Neurological: She is alert.  Oriented to person only  Skin: Skin is warm and dry. She is not diaphoretic.    Labs reviewed: Recent Labs    01/12/17 09/14/17 11/11/17  NA 141 140 139  K 4.3 4.5 4.5  BUN 27* 29* 33*  CREATININE 1.1 1.1 0.7   Recent Labs    01/12/17 09/14/17 11/11/17  AST 13 14 17   ALT 10 11 10   ALKPHOS 56 54 63   Recent Labs    01/12/17 09/14/17 11/11/17  WBC 3.7 3.4 5.7  HGB 11.0* 11.8* 12.6  HCT 34* 34* 37  PLT 191 169 320   Lab Results  Component Value Date   TSH 2.34 09/14/2017   No results found for: HGBA1C Lab Results  Component Value Date   CHOL 250 (A) 09/14/2017   HDL 95 (A) 09/14/2017   LDLCALC 140 09/14/2017   TRIG 63 09/14/2017    Significant Diagnostic Results in last 30 days:  No results found.  Assessment/Plan  Dementia  Decline anticipated. Continue donepezil 5 mg daily with memantine 10 mg bid.   Hyperlipidemia Elevated LDL and t.chol continue atorvastatin 20 mg daily. Check LFT and lipid with recent start of statin  Unsteady gait To ambulate with walker, fall precautions, continue vitamin d along with calcium supplement.  COnstipation stable, currently on miralax daily as needed, has not required it on  chart review, d/c this. monitor  Family/ staff Communication: reviewed care plan with patient and charge nurse.    Labs/tests ordered:  Lipid panel, LFT   Blanchie Serve, MD Internal Medicine [facility] [address] [place], [place] [address] Cell Phone (Monday-Friday 8 am - 5 pm): [phone] On Call: [phone] and follow prompts after 5 pm and on weekends Office Phone: [phone] Office Fax: [phone]

## 2017-12-30 LAB — LIPID PANEL
CHOLESTEROL: 174 (ref 0–200)
HDL: 89 — AB (ref 35–70)
LDL CALC: 75
LDL/HDL RATIO: 2
TRIGLYCERIDES: 34 — AB (ref 40–160)

## 2017-12-30 LAB — HEPATIC FUNCTION PANEL
ALK PHOS: 63 (ref 25–125)
ALT: 16 (ref 7–35)
AST: 19 (ref 13–35)
BILIRUBIN, TOTAL: 0.6

## 2018-01-19 ENCOUNTER — Encounter: Payer: Self-pay | Admitting: Nurse Practitioner

## 2018-01-19 ENCOUNTER — Non-Acute Institutional Stay (SKILLED_NURSING_FACILITY): Payer: Medicare Other | Admitting: Nurse Practitioner

## 2018-01-19 DIAGNOSIS — H04123 Dry eye syndrome of bilateral lacrimal glands: Secondary | ICD-10-CM | POA: Diagnosis not present

## 2018-01-19 DIAGNOSIS — F0280 Dementia in other diseases classified elsewhere without behavioral disturbance: Secondary | ICD-10-CM

## 2018-01-19 DIAGNOSIS — G309 Alzheimer's disease, unspecified: Secondary | ICD-10-CM | POA: Diagnosis not present

## 2018-01-19 DIAGNOSIS — F0150 Vascular dementia without behavioral disturbance: Secondary | ICD-10-CM | POA: Diagnosis not present

## 2018-01-19 DIAGNOSIS — K5909 Other constipation: Secondary | ICD-10-CM | POA: Diagnosis not present

## 2018-01-19 NOTE — Assessment & Plan Note (Signed)
memory difficulties, resides in [facility], transfers and toileting self, ambulates with walker. Continue Namenda, Donepezil.

## 2018-01-19 NOTE — Assessment & Plan Note (Signed)
Her left eye is blind, c/o dry eyes, prn artificial tears are not sufficient since the patient doesn't always remember to request due to her dementia. Will schedule artificial tears I gtt OU tid.

## 2018-01-19 NOTE — Assessment & Plan Note (Signed)
No constipation, continue MOM 30mg  qhs.

## 2018-01-19 NOTE — Progress Notes (Signed)
Location:  [place] Room Number: 8 Place of Service:  SNF (31) Provider:  Renesmay Nesbitt, Manxie  NP  Blanchie Serve, MD  Patient Care Team: Blanchie Serve, MD as PCP - General (Internal Medicine) [place] Minus Breeding, MD as Consulting Physician (Cardiology) Nathanial Rancher, MD as Consulting Physician (Ophthalmology) Leta Baptist, MD as Consulting Physician (Otolaryngology) Llewyn Heap X, NP as Nurse Practitioner (Internal Medicine)  Extended Emergency Contact Information Primary Emergency Contact: Vinson Moselle of [place] Phone: [phone] Mobile Phone: [phone] Relation: Friend Secondary Emergency Contact: Daniel,Gloria Address: [place], [place] of [place] Phone: [phone] Mobile Phone: [phone] Relation: Friend  Code Status:  DNR Goals of care: Advanced Directive information Advanced Directives 01/19/2018  Does Patient Have a Medical Advance Directive? Yes  Type of Paramedic of [place];Living will;Out of facility DNR (pink MOST or yellow form)  Does patient want to make changes to medical advance directive? No - Patient declined  Copy of [place] in Chart? Yes  Pre-existing out of facility DNR order (yellow form or pink MOST form) Yellow form placed in chart (order not valid for inpatient use);Pink MOST form placed in chart (order not valid for inpatient use)     Chief Complaint  Patient presents with  . Medical Management of Chronic Issues    F/U - Alzheimer's disease    HPI:  Pt is a 84 y.o. female seen today for medical management of chronic diseases.     The patient has history of memory difficulties, resides in [facility], transfers and toileting self, ambulates with walker. On Namenda, Donepezil. Her left eye is blind, c/o dry eyes, prn artificial tears are not sufficient. No constipation while on MOM 30mg   qhs.  Past Medical History:  Diagnosis Date  . Alzheimer's disease 01/10/2015   MMSE 25/30  . Anxiety    mild  . Anxiety 01/11/2014  . Aortic stenosis    a. Echo 03/2010:  EF 70-75%, mild AS  . Cerebral atrophy 01/10/2015  . Cerebrovascular disease 11/01/2005   Small vessel disease  . Chronic neck pain 08/06/2011  . Closed pelvic fracture ([place]) 09/29/12   left nondisplaced inferior pelvic ramus  . Glaucoma    "both eyes; no vision in left eye as a result" (04/06/2013)  . History of recurrent UTIs   . Hypertension   . Osteoarthritis 08/06/2011  . Pneumonia 04/06/2013   "for the first time" (04/06/2013)  . PVC (premature ventricular contraction)   . Senile osteoporosis 04/08/2011  . SVT (supraventricular tachycardia) ([place])   . Thumb pain 08/06/2011  . Vision loss of left eye    "can't see at all out of it" (04/06/2013)   Past Surgical History:  Procedure Laterality Date  . APPENDECTOMY    . CATARACT EXTRACTION, BILATERAL Bilateral   . GLAUCOMA SURGERY Left    "put in stent for glaucoma" (04/06/2013)  . TONSILLECTOMY      Allergies  Allergen Reactions  . Codeine Nausea Only  . Sulfate     Outpatient Encounter Medications as of 01/19/2018  Medication Sig  . acetaminophen (TYLENOL) 325 MG tablet Take 650 mg by mouth every 4 (four) hours as needed.   Marland Kitchen atorvastatin (LIPITOR) 20 MG tablet Take 20 mg by mouth daily.  . Calcium Citrate-Vitamin D 315-250 MG-UNIT TABS Take 1 tablet by mouth 2 (two) times daily.   . cholecalciferol (  VITAMIN D) 1000 units tablet Take 1,000 Units by mouth daily.  Marland Kitchen donepezil (ARICEPT) 5 MG tablet Take 5 mg by mouth at bedtime.  . feeding supplement (BOOST / RESOURCE BREEZE) LIQD Take 1 Container by mouth 2 (two) times daily.  Marland Kitchen ketorolac (ACULAR) 0.4 % SOLN Place 1 drop into the left eye 4 (four) times daily as needed.   . magnesium hydroxide (MILK OF MAGNESIA) 400 MG/5ML suspension Take 30 mLs by mouth at bedtime.  . memantine (NAMENDA) 10 MG tablet Take 10  mg by mouth 2 (two) times daily.  . polyvinyl alcohol (LIQUIFILM TEARS) 1.4 % ophthalmic solution Place 1 drop into both eyes 4 (four) times daily as needed.   . [DISCONTINUED] polyethylene glycol (MIRALAX / GLYCOLAX) packet Take 17 g by mouth daily as needed for mild constipation.   No facility-administered encounter medications on file as of 01/19/2018.    ROS was provided with assistance of staff Review of Systems  Constitutional: Negative for activity change, appetite change, chills, diaphoresis, fatigue and fever.  HENT: Positive for hearing loss. Negative for congestion, trouble swallowing and voice change.   Eyes: Positive for visual disturbance.       Left eye blind. C/o dry eye  Respiratory: Negative for cough, choking, chest tightness, shortness of breath and wheezing.   Cardiovascular: Negative for chest pain, palpitations and leg swelling.  Gastrointestinal: Negative for abdominal distention, abdominal pain, constipation, diarrhea, nausea and vomiting.  Genitourinary: Negative for difficulty urinating, dysuria and urgency.       Incontinent of urine.   Musculoskeletal: Positive for gait problem. Negative for joint swelling.  Skin: Negative for color change and rash.  Neurological: Negative for speech difficulty, weakness and headaches.       Dementia  Psychiatric/Behavioral: Positive for confusion. Negative for agitation, behavioral problems, hallucinations and sleep disturbance. The patient is not nervous/anxious.     Immunization History  Administered Date(s) Administered  . Influenza Split 09/22/2011, 09/13/2012  . Influenza-Unspecified 08/09/2013, 08/27/2014, 08/08/2015, 08/25/2016, 08/31/2017  . Tdap 09/13/2012   Pertinent  Health Maintenance Due  Topic Date Due  . HEMOGLOBIN A1C  October 23, 1917  . FOOT EXAM  [birth year]1929  . OPHTHALMOLOGY EXAM  03/[birth year]1929  . URINE MICROALBUMIN  03/03/1929  . PNA vac Low Risk Adult (1 of 2 - PCV13) 03/03/1984  . DEXA SCAN  05/06/2018  (Originally 03/03/1984)  . INFLUENZA VACCINE  Completed   Fall Risk  06/29/2017 03/21/2015 08/23/2014 09/29/2012 09/29/2012  Falls in the past year? - No No No No  Number falls in past yr: 1 - - - -  Injury with Fall? No - - - -   Functional Status Survey:    Vitals:   01/19/18 1130  BP: (!) 132/58  Pulse: 72  Resp: 20  Temp: 98.1 F (36.7 C)  Weight: 113 lb 12.8 oz (51.6 kg)  Height: 5\' 4"  (1.626 m)   Body mass index is 19.53 kg/m. Physical Exam  Constitutional: She appears well-developed and well-nourished.  HENT:  Head: Normocephalic and atraumatic.  Eyes: Conjunctivae and EOM are normal. Pupils are equal, round, and reactive to light.  Left eye blind  Neck: Normal range of motion. Neck supple. No JVD present. No thyromegaly present.  Cardiovascular: Normal rate and regular rhythm.  Murmur heard. Pulmonary/Chest: Effort normal and breath sounds normal. She has no wheezes. She has no rales.  Abdominal: Soft. Bowel sounds are normal. She exhibits no distension. There is no tenderness.  Musculoskeletal: She exhibits no edema or tenderness.  Self transfer and toileting, ambulates with walker, kyphoscoliosis. R shoulder has reduced ROM over head.   Neurological: She is alert. She exhibits normal muscle tone. Coordination normal.  Oriented to person and her room on unit.   Skin: Skin is warm and dry.  Psychiatric: She has a normal mood and affect. Her behavior is normal.    Labs reviewed: Recent Labs    09/14/17 11/11/17  NA 140 139  K 4.5 4.5  BUN 29* 33*  CREATININE 1.1 0.7   Recent Labs    09/14/17 11/11/17  AST 14 17  ALT 11 10  ALKPHOS 54 63   Recent Labs    09/14/17 11/11/17  WBC 3.4 5.7  HGB 11.8* 12.6  HCT 34* 37  PLT 169 320   Lab Results  Component Value Date   TSH 2.34 09/14/2017   No results found for: HGBA1C Lab Results  Component Value Date   CHOL 250 (A) 09/14/2017   HDL 95 (A) 09/14/2017   LDLCALC 140 09/14/2017   TRIG 63 09/14/2017      Significant Diagnostic Results in last 30 days:  No results found.  Assessment/Plan Mixed Alzheimer's and vascular dementia memory difficulties, resides in SNF [facility], transfers and toileting self, ambulates with walker. Continue Namenda, Donepezil.   Chronic constipation No constipation, continue MOM 30mg  qhs.   Dry eyes Her left eye is blind, c/o dry eyes, prn artificial tears are not sufficient since the patient doesn't always remember to request due to her dementia. Will schedule artificial tears I gtt OU tid.      Family/ staff Communication: plan of care reviewed with the patient and charge nurse.   Labs/tests ordered:  none  Time spend 25 minutes.

## 2018-02-15 ENCOUNTER — Other Ambulatory Visit: Payer: Self-pay | Admitting: *Deleted

## 2018-02-15 ENCOUNTER — Encounter: Payer: Self-pay | Admitting: Nurse Practitioner

## 2018-02-15 ENCOUNTER — Non-Acute Institutional Stay (SKILLED_NURSING_FACILITY): Payer: Medicare Other | Admitting: Nurse Practitioner

## 2018-02-15 DIAGNOSIS — I10 Essential (primary) hypertension: Secondary | ICD-10-CM

## 2018-02-15 DIAGNOSIS — I129 Hypertensive chronic kidney disease with stage 1 through stage 4 chronic kidney disease, or unspecified chronic kidney disease: Secondary | ICD-10-CM

## 2018-02-15 DIAGNOSIS — K5909 Other constipation: Secondary | ICD-10-CM | POA: Diagnosis not present

## 2018-02-15 DIAGNOSIS — G309 Alzheimer's disease, unspecified: Secondary | ICD-10-CM | POA: Diagnosis not present

## 2018-02-15 DIAGNOSIS — F0150 Vascular dementia without behavioral disturbance: Secondary | ICD-10-CM | POA: Diagnosis not present

## 2018-02-15 DIAGNOSIS — F0280 Dementia in other diseases classified elsewhere without behavioral disturbance: Secondary | ICD-10-CM

## 2018-02-15 NOTE — Progress Notes (Addendum)
Location:  [place] Room Number: 32 Place of Service:  SNF (31) Provider:  Mast, Manxie  NP  Blanchie Serve, MD  Patient Care Team: Blanchie Serve, MD as PCP - General (Internal Medicine) [place] Minus Breeding, MD as Consulting Physician (Cardiology) Nathanial Rancher, MD as Consulting Physician (Ophthalmology) Leta Baptist, MD as Consulting Physician (Otolaryngology) Mast, Man X, NP as Nurse Practitioner (Internal Medicine)  Extended Emergency Contact Information Primary Emergency Contact: Vinson Moselle of [place] Phone: [phone] Mobile Phone: [phone] Relation: Friend Secondary Emergency Contact: Daniel,Gloria Address: [place], [place] of [place] Phone: [phone] Mobile Phone: [phone] Relation: Friend  Code Status:  DNR Goals of care: Advanced Directive information Advanced Directives 04/13/2018  Does Patient Have a Medical Advance Directive? Yes  Type of Paramedic of [place];Living will;Out of facility DNR (pink MOST or yellow form)  Does patient want to make changes to medical advance directive? No - Patient declined  Copy of [place] in Chart? Yes  Pre-existing out of facility DNR order (yellow form or pink MOST form) Yellow form placed in chart (order not valid for inpatient use)     Chief Complaint  Patient presents with  . Medical Management of Chronic Issues    F/U - dry eyes, Mixed alzheimers,    HPI:  Pt is a 84 y.o. female seen today for medical management of chronic diseases.    The patient has history of HTN, blood pressure is controlled, not on antihypertensive agent, on Atorvastatin 20mg  qd for cardiovascular risk reduction. Hypertensive kidney disease, stable, last creat 1.13 01/12/18. She has dementia, resides in SNF [facility] for care needs, ambulates with walker with SBA, on Memantine and  Donepezil for memory. No constipation while on MOM 1ml daily.   Past Medical History:  Diagnosis Date  . Alzheimer's disease 01/10/2015   MMSE 25/30  . Anxiety    mild  . Anxiety 01/11/2014  . Aortic stenosis    a. Echo 03/2010:  EF 70-75%, mild AS  . Cerebral atrophy 01/10/2015  . Cerebrovascular disease 11/01/2005   Small vessel disease  . Chronic neck pain 08/06/2011  . Closed pelvic fracture ([place]) 09/29/12   left nondisplaced inferior pelvic ramus  . Glaucoma    "both eyes; no vision in left eye as a result" (04/06/2013)  . History of recurrent UTIs   . Hypertension   . Osteoarthritis 08/06/2011  . Pneumonia 04/06/2013   "for the first time" (04/06/2013)  . PVC (premature ventricular contraction)   . Senile osteoporosis 04/08/2011  . SVT (supraventricular tachycardia) ([place])   . Thumb pain 08/06/2011  . Vision loss of left eye    "can't see at all out of it" (04/06/2013)   Past Surgical History:  Procedure Laterality Date  . APPENDECTOMY    . CATARACT EXTRACTION, BILATERAL Bilateral   . GLAUCOMA SURGERY Left    "put in stent for glaucoma" (04/06/2013)  . TONSILLECTOMY      Allergies  Allergen Reactions  . Codeine Nausea Only  . Sulfate     Outpatient Encounter Medications as of 02/15/2018  Medication Sig  . acetaminophen (TYLENOL) 325 MG tablet Take 650 mg by mouth every 4 (four) hours as needed.   Marland Kitchen atorvastatin (LIPITOR) 20 MG tablet Take 20 mg by mouth daily.  . Calcium Citrate-Vitamin D 315-250 MG-UNIT TABS Take 1 tablet by mouth 2 (  two) times daily.   . cholecalciferol (VITAMIN D) 1000 units tablet Take 1,000 Units by mouth daily.  Marland Kitchen donepezil (ARICEPT) 5 MG tablet Take 5 mg by mouth at bedtime.  . feeding supplement (BOOST / RESOURCE BREEZE) LIQD Take 1 Container by mouth 2 (two) times daily.  . magnesium hydroxide (MILK OF MAGNESIA) 400 MG/5ML suspension Take 30 mLs by mouth at bedtime.  . memantine (NAMENDA) 10 MG tablet Take 10 mg by mouth 2 (two) times daily.  Vladimir Faster Glycol-Propyl Glycol (SYSTANE) 0.4-0.3 % GEL ophthalmic gel Place 1 application into both eyes 3 (three) times daily. For dryness  . [DISCONTINUED] polyvinyl alcohol (LIQUIFILM TEARS) 1.4 % ophthalmic solution Place 1 drop into both eyes 4 (four) times daily as needed.   . [DISCONTINUED] ketorolac (ACULAR) 0.4 % SOLN Place 1 drop into the left eye 4 (four) times daily as needed.    No facility-administered encounter medications on file as of 02/15/2018.    ROS was provided with assistance of staff Review of Systems  Constitutional: Negative for activity change, appetite change, chills, diaphoresis, fatigue and fever.  HENT: Positive for hearing loss. Negative for congestion, trouble swallowing and voice change.   Eyes: Positive for visual disturbance.  Respiratory: Negative for cough and shortness of breath.   Cardiovascular: Negative for chest pain, palpitations and leg swelling.  Gastrointestinal: Negative for abdominal distention, abdominal pain, constipation and diarrhea.  Genitourinary: Negative for difficulty urinating, dysuria and urgency.  Musculoskeletal: Positive for arthralgias and gait problem. Negative for joint swelling.  Skin: Negative for color change, pallor and rash.  Neurological: Negative for dizziness, tremors and headaches.       Memory lapses.   Psychiatric/Behavioral: Positive for confusion. Negative for agitation, behavioral problems and sleep disturbance.    Immunization History  Administered Date(s) Administered  . Influenza Split 09/22/2011, 09/13/2012  . Influenza-Unspecified 08/09/2013, 08/27/2014, 08/08/2015, 08/25/2016, 08/31/2017  . Tdap 09/13/2012   Pertinent  Health Maintenance Due  Topic Date Due  . HEMOGLOBIN A1C  01/29/19  . FOOT EXAM  03/03/1929  . OPHTHALMOLOGY EXAM  03/03/1929  . URINE MICROALBUMIN  03/03/1929  . PNA vac Low Risk Adult (1 of 2 - PCV13) 03/03/1984  . DEXA SCAN  05/06/2018 (Originally 03/03/1984)  . INFLUENZA VACCINE   06/09/2018   Fall Risk  06/29/2017 03/21/2015 08/23/2014 09/29/2012 09/29/2012  Falls in the past year? - No No No No  Number falls in past yr: 1 - - - -  Injury with Fall? No - - - -   Functional Status Survey:    Vitals:   02/15/18 1157  BP: 140/62  Pulse: 65  Resp: 20  Temp: 98.2 F (36.8 C)  Weight: 115 lb (52.2 kg)  Height: 5\' 4"  (1.626 m)   Body mass index is 19.74 kg/m. Physical Exam  Constitutional: She appears well-developed and well-nourished.  HENT:  Head: Normocephalic and atraumatic.  Eyes: Conjunctivae are normal. Right eye exhibits no discharge. Left eye exhibits no discharge.  Neck: Normal range of motion. Neck supple. No JVD present. No thyromegaly present.  Cardiovascular: Normal rate and regular rhythm.  Murmur heard. Pulmonary/Chest: She has no wheezes. She has no rales.  Abdominal: Soft. She exhibits no distension. There is no tenderness.  Musculoskeletal: She exhibits no edema.  Right shoulder limited overhead ROM. Kyphoscoliosis   Neurological: She is alert. No cranial nerve deficit. She exhibits normal muscle tone. Coordination normal.  Oriented to person and place  Skin: Skin is warm and dry.  Psychiatric: She has a normal mood and affect. Her behavior is normal.    Labs reviewed: Recent Labs    09/14/17 11/11/17  NA 140 139  K 4.5 4.5  BUN 29* 33*  CREATININE 1.1 0.7   Recent Labs    09/14/17 11/11/17 12/30/17  AST 14 17 19   ALT 11 10 16   ALKPHOS 54 63 63   Recent Labs    09/14/17 11/11/17  WBC 3.4 5.7  HGB 11.8* 12.6  HCT 34* 37  PLT 169 320   Lab Results  Component Value Date   TSH 2.34 09/14/2017   No results found for: HGBA1C Lab Results  Component Value Date   CHOL 174 12/30/2017   HDL 89 (A) 12/30/2017   LDLCALC 75 12/30/2017   TRIG 34 (A) 12/30/2017    Significant Diagnostic Results in last 30 days:  No results found.  Assessment/Plan HTN (hypertension) HTN, blood pressure is controlled, not on  antihypertensive agent, continue  Atorvastatin 20mg  qd for cardiovascular risk reduction.   Hypertensive kidney disease Hypertensive kidney disease, stable, last creat 1.13 01/12/18.   Chronic constipation No constipation, continue  MOM 11ml daily.    Mixed Alzheimer's and vascular dementia She has dementia, resides in SNF [facility] for care needs, ambulates with walker with SBA, continue Memantine and Donepezil for memory.      Family/ staff Communication: plan of care reviewed with the patient and charge nurse. Prevnar 13 prescription written   Labs/tests ordered:  None  Time spend 25 minutes.

## 2018-02-15 NOTE — Assessment & Plan Note (Signed)
No constipation, continue  MOM 72ml daily.

## 2018-02-15 NOTE — Assessment & Plan Note (Signed)
Hypertensive kidney disease, stable, last creat 1.13 01/12/18.

## 2018-02-15 NOTE — Assessment & Plan Note (Signed)
She has dementia, resides in SNF [facility] for care needs, ambulates with walker with SBA, continue Memantine and Donepezil for memory.

## 2018-02-15 NOTE — Assessment & Plan Note (Signed)
HTN, blood pressure is controlled, not on antihypertensive agent, continue  Atorvastatin 20mg  qd for cardiovascular risk reduction.

## 2018-03-15 ENCOUNTER — Non-Acute Institutional Stay (SKILLED_NURSING_FACILITY): Payer: Medicare Other | Admitting: Internal Medicine

## 2018-03-15 ENCOUNTER — Encounter: Payer: Self-pay | Admitting: Internal Medicine

## 2018-03-15 DIAGNOSIS — E782 Mixed hyperlipidemia: Secondary | ICD-10-CM

## 2018-03-15 DIAGNOSIS — E440 Moderate protein-calorie malnutrition: Secondary | ICD-10-CM

## 2018-03-15 DIAGNOSIS — G309 Alzheimer's disease, unspecified: Secondary | ICD-10-CM

## 2018-03-15 DIAGNOSIS — F0280 Dementia in other diseases classified elsewhere without behavioral disturbance: Secondary | ICD-10-CM | POA: Diagnosis not present

## 2018-03-15 DIAGNOSIS — F0150 Vascular dementia without behavioral disturbance: Secondary | ICD-10-CM | POA: Diagnosis not present

## 2018-03-15 DIAGNOSIS — M150 Primary generalized (osteo)arthritis: Secondary | ICD-10-CM

## 2018-03-15 DIAGNOSIS — M159 Polyosteoarthritis, unspecified: Secondary | ICD-10-CM

## 2018-03-15 NOTE — Progress Notes (Signed)
Location:  [place] Room Number: 53 Place of Service:  SNF [phone]) Provider:  Blanchie Serve MD  Blanchie Serve, MD  Patient Care Team: Blanchie Serve, MD as PCP - General (Internal Medicine) [place] Minus Breeding, MD as Consulting Physician (Cardiology) Nathanial Rancher, MD as Consulting Physician (Ophthalmology) Leta Baptist, MD as Consulting Physician (Otolaryngology) Mast, Man X, NP as Nurse Practitioner (Internal Medicine)  Extended Emergency Contact Information Primary Emergency Contact: Vinson Moselle of [place] Phone: [phone] Mobile Phone: [phone] Relation: Friend Secondary Emergency Contact: Daniel,Gloria Address: [place], [place] of [place] Phone: [phone] Mobile Phone: [phone] Relation: Friend  Code Status:  DNR  Goals of care: Advanced Directive information Advanced Directives 03/15/2018  Does Patient Have a Medical Advance Directive? Yes  Type of Paramedic of [place];Living will;Out of facility DNR (pink MOST or yellow form)  Does patient want to make changes to medical advance directive? No - Patient declined  Copy of [place] in Chart? Yes  Pre-existing out of facility DNR order (yellow form or pink MOST form) Yellow form placed in chart (order not valid for inpatient use)     Chief Complaint  Patient presents with  . Medical Management of Chronic Issues    F/u-HTN, unstaedy gait, alzheimers disease    HPI:  Pt is a 84 y.o. female seen today for medical management of chronic diseases. She is reading her paper sitting on her recliner. She had a good lunch. Denies any acute health issue today. She is pleasantly confused.   Dementia- takes donepezil 5 mg daily and memantine 10 mg bid  OA- takes acetaminophen 650 mg q4h prn, takes vitamin d with calcium  hyperlipidemia- takes atorvastatin  20 mg daily  Protein calorie malnutrition- takes feeding supplement, feeds herself, takes medications crushed.     Past Medical History:  Diagnosis Date  . Alzheimer's disease 01/10/2015   MMSE 25/30  . Anxiety    mild  . Anxiety 01/11/2014  . Aortic stenosis    a. Echo 03/2010:  EF 70-75%, mild AS  . Cerebral atrophy 01/10/2015  . Cerebrovascular disease 11/01/2005   Small vessel disease  . Chronic neck pain 08/06/2011  . Closed pelvic fracture ([place]) 09/29/12   left nondisplaced inferior pelvic ramus  . Glaucoma    "both eyes; no vision in left eye as a result" (04/06/2013)  . History of recurrent UTIs   . Hypertension   . Osteoarthritis 08/06/2011  . Pneumonia 04/06/2013   "for the first time" (04/06/2013)  . PVC (premature ventricular contraction)   . Senile osteoporosis 04/08/2011  . SVT (supraventricular tachycardia) ([place])   . Thumb pain 08/06/2011  . Vision loss of left eye    "can't see at all out of it" (04/06/2013)   Past Surgical History:  Procedure Laterality Date  . APPENDECTOMY    . CATARACT EXTRACTION, BILATERAL Bilateral   . GLAUCOMA SURGERY Left    "put in stent for glaucoma" (04/06/2013)  . TONSILLECTOMY      Allergies  Allergen Reactions  . Codeine Nausea Only  . Sulfate     Outpatient Encounter Medications as of 03/15/2018  Medication Sig  . acetaminophen (TYLENOL) 325 MG tablet Take 650 mg by mouth every 4 (four) hours as needed.   Marland Kitchen atorvastatin (LIPITOR) 20 MG tablet Take 20 mg by mouth daily.  . Calcium Citrate-Vitamin D  315-250 MG-UNIT TABS Take 1 tablet by mouth 2 (two) times daily.   . cholecalciferol (VITAMIN D) 1000 units tablet Take 1,000 Units by mouth daily.  Marland Kitchen donepezil (ARICEPT) 5 MG tablet Take 5 mg by mouth at bedtime.  . feeding supplement (BOOST / RESOURCE BREEZE) LIQD Take 1 Container by mouth 2 (two) times daily.  . magnesium hydroxide (MILK OF MAGNESIA) 400 MG/5ML suspension Take 30 mLs by mouth at bedtime.  . memantine (NAMENDA) 10 MG  tablet Take 10 mg by mouth 2 (two) times daily.  Vladimir Faster Glycol-Propyl Glycol (SYSTANE) 0.4-0.3 % GEL ophthalmic gel Place 1 application into both eyes 3 (three) times daily. For dryness  . polyvinyl alcohol (LIQUIFILM TEARS) 1.4 % ophthalmic solution Place 1 drop into both eyes 4 (four) times daily as needed.    No facility-administered encounter medications on file as of 03/15/2018.     Review of Systems  Constitutional: Negative for appetite change, chills, diaphoresis and fever.  HENT: Negative for congestion, mouth sores, rhinorrhea, sore throat and trouble swallowing.   Eyes: Positive for visual disturbance.  Respiratory: Negative for cough, shortness of breath and wheezing.   Cardiovascular: Negative for chest pain and palpitations.  Gastrointestinal: Negative for abdominal pain, constipation, diarrhea, nausea and vomiting.  Genitourinary: Negative for dysuria and hematuria.  Musculoskeletal: Positive for gait problem. Negative for back pain.       Unsteady gait, uses walker, no fall reported  Skin: Negative for rash and wound.  Neurological: Negative for dizziness, tremors, weakness and headaches.  Psychiatric/Behavioral: Positive for confusion. Negative for behavioral problems.    Immunization History  Administered Date(s) Administered  . Influenza Split 09/22/2011, 09/13/2012  . Influenza-Unspecified 08/09/2013, 08/27/2014, 08/08/2015, 08/25/2016, 08/31/2017  . Tdap 09/13/2012   Pertinent  Health Maintenance Due  Topic Date Due  . HEMOGLOBIN A1C  1919[birth year]  . FOOT EXAM  03/03[birth year]1929  . OPHTHALMOLOGY EXAM  03/03/1929  . URINE MICROALBUMIN  03/03/1929  . PNA vac Low Risk Adult (1 of 2 - PCV13) 03/03/1984  . DEXA SCAN  05/06/2018 (Originally 03/03/1984)  . INFLUENZA VACCINE  06/09/2018   Fall Risk  06/29/2017 03/21/2015 08/23/2014 09/29/2012 09/29/2012  Falls in the past year? - No No No No  Number falls in past yr: 1 - - - -  Injury with Fall? No - - - -   Functional  Status Survey:    Vitals:   03/15/18 1520  BP: 140/76  Pulse: 70  Resp: 16  Temp: (!) 96.6 F (35.9 C)  Weight: 112 lb 1.6 oz (50.8 kg)  Height: 5\' 4"  (1.626 m)   Body mass index is 19.24 kg/m.   Wt Readings from Last 3 Encounters:  03/15/18 112 lb 1.6 oz (50.8 kg)  02/15/18 115 lb (52.2 kg)  01/19/18 113 lb 12.8 oz (51.6 kg)   Physical Exam  Constitutional: No distress.  Frail, elderly female  HENT:  Head: Normocephalic and atraumatic.  Right Ear: External ear normal.  Left Ear: External ear normal.  Nose: Nose normal.  Mouth/Throat: Oropharynx is clear and moist. No oropharyngeal exudate.  Eyes: Pupils are equal, round, and reactive to light. Conjunctivae and EOM are normal. Right eye exhibits no discharge. Left eye exhibits no discharge.  Legally blind in left eye  Neck: Normal range of motion. Neck supple.  Cardiovascular: Normal rate, regular rhythm and intact distal pulses.  Pulmonary/Chest: Effort normal and breath sounds normal. No respiratory distress. She has no wheezes. She has no rales.  Abdominal: Soft.  Bowel sounds are normal. There is no tenderness.  Musculoskeletal: She exhibits no edema.  Able to move all 4 extremities, unsteady gait, uses walker  Lymphadenopathy:    She has no cervical adenopathy.  Neurological: She is alert.  Oriented to place and self only  Skin: Skin is warm and dry. She is not diaphoretic.  Psychiatric: She has a normal mood and affect. Her behavior is normal.    Labs reviewed: Recent Labs    09/14/17 11/11/17  NA 140 139  K 4.5 4.5  BUN 29* 33*  CREATININE 1.1 0.7   Recent Labs    09/14/17 11/11/17 12/30/17  AST 14 17 19   ALT 11 10 16   ALKPHOS 54 63 63   Recent Labs    09/14/17 11/11/17  WBC 3.4 5.7  HGB 11.8* 12.6  HCT 34* 37  PLT 169 320   Lab Results  Component Value Date   TSH 2.34 09/14/2017   No results found for: HGBA1C Lab Results  Component Value Date   CHOL 174 12/30/2017   HDL 89 (A)  12/30/2017   LDLCALC 75 12/30/2017   TRIG 34 (A) 12/30/2017    Significant Diagnostic Results in last 30 days:  No results found.  Assessment/Plan  1. Mixed Alzheimer's and vascular dementia Continue namenda and donepezil for now. Supportive care.   2. Primary osteoarthritis involving multiple joints Continue tylenol as needed for pain and calcium and vitamin d supplement, monitor.   3. Protein-calorie malnutrition, moderate ([place]) Supportive care. Continue nutritional supplement  4. Hyperlipidemia, mixed Reviewed lipid panel, LDL and TG at goal. Decrease atorvastatin to 10 mg daily and monitor.   Family/ staff Communication: reviewed care plan with patient and charge nurse.    Labs/tests ordered:  none   Blanchie Serve, MD Internal Medicine [facility] [address] [place], [place] [address] Cell Phone (Monday-Friday 8 am - 5 pm): [phone] On Call: [phone] and follow prompts after 5 pm and on weekends Office Phone: [phone] Office Fax: [phone]

## 2018-04-13 ENCOUNTER — Encounter: Payer: Self-pay | Admitting: Nurse Practitioner

## 2018-04-13 ENCOUNTER — Non-Acute Institutional Stay (SKILLED_NURSING_FACILITY): Payer: Medicare Other | Admitting: Nurse Practitioner

## 2018-04-13 DIAGNOSIS — F0150 Vascular dementia without behavioral disturbance: Secondary | ICD-10-CM | POA: Diagnosis not present

## 2018-04-13 DIAGNOSIS — F0280 Dementia in other diseases classified elsewhere without behavioral disturbance: Secondary | ICD-10-CM | POA: Diagnosis not present

## 2018-04-13 DIAGNOSIS — K5909 Other constipation: Secondary | ICD-10-CM | POA: Diagnosis not present

## 2018-04-13 DIAGNOSIS — I10 Essential (primary) hypertension: Secondary | ICD-10-CM | POA: Diagnosis not present

## 2018-04-13 DIAGNOSIS — D638 Anemia in other chronic diseases classified elsewhere: Secondary | ICD-10-CM | POA: Diagnosis not present

## 2018-04-13 DIAGNOSIS — G309 Alzheimer's disease, unspecified: Secondary | ICD-10-CM

## 2018-04-13 NOTE — Assessment & Plan Note (Signed)
No constipation, continue  MOM daily.

## 2018-04-13 NOTE — Assessment & Plan Note (Signed)
Last Hgb 12.6 11/11/17, update CBC

## 2018-04-13 NOTE — Assessment & Plan Note (Signed)
HTN, blood pressure is controlled, not on antihypertensive meds. Update CMP

## 2018-04-13 NOTE — Assessment & Plan Note (Signed)
She resides in SNF [facility], self transfer, ambulates with walker, continue Memantine 10mg  bid and Donepezil 5mg  daily for memory. Update CBC

## 2018-04-13 NOTE — Progress Notes (Signed)
Location:  [facility] Room Number: 21 Place of Service:  SNF (31) Provider:  Claude Waldman, ManXie  NP  Blanchie Serve, MD  Patient Care Team: Blanchie Serve, MD as PCP - General (Internal Medicine) [place] Minus Breeding, MD as Consulting Physician (Cardiology) Nathanial Rancher, MD as Consulting Physician (Ophthalmology) Leta Baptist, MD as Consulting Physician (Otolaryngology) Miguel Medal X, NP as Nurse Practitioner (Internal Medicine)  Extended Emergency Contact Information Primary Emergency Contact: Vinson Moselle of [place] Phone: [phone] Mobile Phone: [phone] Relation: Friend Secondary Emergency Contact: Daniel,Gloria Address: [place], [place] of [place] Phone: [phone] Mobile Phone: [phone] Relation: Friend  Code Status:  DNR Goals of care: Advanced Directive information Advanced Directives 04/13/2018  Does Patient Have a Medical Advance Directive? Yes  Type of Paramedic of [place];Living will;Out of facility DNR (pink MOST or yellow form)  Does patient want to make changes to medical advance directive? No - Patient declined  Copy of [place] in Chart? Yes  Pre-existing out of facility DNR order (yellow form or pink MOST form) Yellow form placed in chart (order not valid for inpatient use)     Chief Complaint  Patient presents with  . Medical Management of Chronic Issues    F/u- Alzheimer, HTN, Hyperlipdemia, osteoarthritis    HPI:  Pt is a 84 y.o. female seen today for medical management of chronic diseases.     The patient has history of HTN, blood pressure is controlled, not on antihypertensive meds. She resides in SNF [facility], self transfer, ambulates with walker, on Memantine 10mg  bid and Donepezil 5mg  daily for memory. No constipation while on MOM daily.    Past Medical History:  Diagnosis Date  .  Alzheimer's disease 01/10/2015   MMSE 25/30  . Anxiety    mild  . Anxiety 01/11/2014  . Aortic stenosis    a. Echo 03/2010:  EF 70-75%, mild AS  . Cerebral atrophy 01/10/2015  . Cerebrovascular disease 11/01/2005   Small vessel disease  . Chronic neck pain 08/06/2011  . Closed pelvic fracture ([place]) 09/29/12   left nondisplaced inferior pelvic ramus  . Glaucoma    "both eyes; no vision in left eye as a result" (04/06/2013)  . History of recurrent UTIs   . Hypertension   . Osteoarthritis 08/06/2011  . Pneumonia 04/06/2013   "for the first time" (04/06/2013)  . PVC (premature ventricular contraction)   . Senile osteoporosis 04/08/2011  . SVT (supraventricular tachycardia) ([place])   . Thumb pain 08/06/2011  . Vision loss of left eye    "can't see at all out of it" (04/06/2013)   Past Surgical History:  Procedure Laterality Date  . APPENDECTOMY    . CATARACT EXTRACTION, BILATERAL Bilateral   . GLAUCOMA SURGERY Left    "put in stent for glaucoma" (04/06/2013)  . TONSILLECTOMY      Allergies  Allergen Reactions  . Codeine Nausea Only  . Sulfate     Outpatient Encounter Medications as of 04/13/2018  Medication Sig  . acetaminophen (TYLENOL) 325 MG tablet Take 650 mg by mouth every 4 (four) hours as needed.   Marland Kitchen atorvastatin (LIPITOR) 10 MG tablet Take 10 mg by mouth daily.  . Calcium Citrate-Vitamin D 315-250 MG-UNIT TABS Take 1 tablet by mouth 2 (two) times daily.   . cholecalciferol (VITAMIN D) 1000 units tablet Take 1,000 Units by mouth  daily.  . donepezil (ARICEPT) 5 MG tablet Take 5 mg by mouth at bedtime.  . feeding supplement (BOOST / RESOURCE BREEZE) LIQD Take 1 Container by mouth 2 (two) times daily.  . magnesium hydroxide (MILK OF MAGNESIA) 400 MG/5ML suspension Take 30 mLs by mouth at bedtime.  . memantine (NAMENDA) 10 MG tablet Take 10 mg by mouth 2 (two) times daily.  Vladimir Faster Glycol-Propyl Glycol (SYSTANE) 0.4-0.3 % GEL ophthalmic gel Place 1 application into both eyes 3  (three) times daily. For dryness  . [DISCONTINUED] atorvastatin (LIPITOR) 20 MG tablet Take 10 mg by mouth daily. Take 1/2  Tablet =10 mg  . [DISCONTINUED] polyvinyl alcohol (LIQUIFILM TEARS) 1.4 % ophthalmic solution Place 1 drop into both eyes 4 (four) times daily as needed.    No facility-administered encounter medications on file as of 04/13/2018.    ROS was provided with assistance of staff Review of Systems  Constitutional: Negative for activity change, appetite change, chills, diaphoresis, fatigue and fever.  HENT: Positive for hearing loss. Negative for congestion, trouble swallowing and voice change.   Eyes: Positive for visual disturbance.       Low vision left eye  Respiratory: Negative for cough, shortness of breath and wheezing.   Cardiovascular: Negative for chest pain, palpitations and leg swelling.  Gastrointestinal: Negative for abdominal distention, abdominal pain, constipation, diarrhea, nausea and vomiting.  Genitourinary: Negative for difficulty urinating, dysuria and urgency.  Musculoskeletal: Positive for arthralgias and gait problem.  Skin: Negative for color change and pallor.  Neurological: Negative for dizziness, speech difficulty, weakness and headaches.       Dementia  Psychiatric/Behavioral: Positive for confusion. Negative for agitation, behavioral problems, hallucinations and sleep disturbance. The patient is not nervous/anxious.     Immunization History  Administered Date(s) Administered  . Influenza Split 09/22/2011, 09/13/2012  . Influenza-Unspecified 08/09/2013, 08/27/2014, 08/08/2015, 08/25/2016, 08/31/2017  . Tdap 09/13/2012   Pertinent  Health Maintenance Due  Topic Date Due  . HEMOGLOBIN A1C  13-Jun-1917  . FOOT EXAM  [birth year]/03[birth year]1929  . OPHTHALMOLOGY EXAM  03/03/1929  . URINE MICROALBUMIN  03/03/1929  . PNA vac Low Risk Adult (1 of 2 - PCV13) 03/03/1984  . DEXA SCAN  05/06/2018 (Originally 03/03/1984)  . INFLUENZA VACCINE  06/09/2018   Fall Risk   06/29/2017 03/21/2015 08/23/2014 09/29/2012 09/29/2012  Falls in the past year? - No No No No  Number falls in past yr: 1 - - - -  Injury with Fall? No - - - -   Functional Status Survey:    Vitals:   04/13/18 1053  BP: 126/70  Pulse: 81  Resp: 18  Temp: 98.1 F (36.7 C)  Weight: 112 lb 1.6 oz (50.8 kg)  Height: 5\' 4"  (1.626 m)   Body mass index is 19.24 kg/m. Physical Exam  Constitutional: She appears well-developed and well-nourished.  HENT:  Head: Normocephalic and atraumatic.  Eyes: Pupils are equal, round, and reactive to light. Conjunctivae and EOM are normal.  Legally blind left eye.   Neck: Normal range of motion. Neck supple. No JVD present. No thyromegaly present.  Cardiovascular: Normal rate and regular rhythm.  Murmur heard. Pulmonary/Chest: Effort normal and breath sounds normal. She has no wheezes. She has no rales.  Abdominal: Soft. Bowel sounds are normal. She exhibits no distension. There is no tenderness.  Musculoskeletal: She exhibits no edema.  Self transfer, ambulates with walker.   Neurological: She is alert. No cranial nerve deficit. She exhibits normal muscle tone. Coordination normal.  Oriented to person and place.   Skin: Skin is warm and dry.  Psychiatric: She has a normal mood and affect. Her behavior is normal.    Labs reviewed: Recent Labs    09/14/17 11/11/17  NA 140 139  K 4.5 4.5  BUN 29* 33*  CREATININE 1.1 0.7   Recent Labs    09/14/17 11/11/17 12/30/17  AST 14 17 19   ALT 11 10 16   ALKPHOS 54 63 63   Recent Labs    09/14/17 11/11/17  WBC 3.4 5.7  HGB 11.8* 12.6  HCT 34* 37  PLT 169 320   Lab Results  Component Value Date   TSH 2.34 09/14/2017   No results found for: HGBA1C Lab Results  Component Value Date   CHOL 174 12/30/2017   HDL 89 (A) 12/30/2017   LDLCALC 75 12/30/2017   TRIG 34 (A) 12/30/2017    Significant Diagnostic Results in last 30 days:  No results found.  Assessment/Plan HTN  (hypertension) HTN, blood pressure is controlled, not on antihypertensive meds. Update CMP  Mixed Alzheimer's and vascular dementia She resides in SNF [facility], self transfer, ambulates with walker, continue Memantine 10mg  bid and Donepezil 5mg  daily for memory. Update CBC   Chronic constipation No constipation, continue  MOM daily.    Anemia of chronic disease Last Hgb 12.6 11/11/17, update CBC     Family/ staff Communication: plan of care reviewed with the patient and charge nurse.   Labs/tests ordered: CBC, CMP  Time spend 25 minutes.

## 2018-04-14 ENCOUNTER — Other Ambulatory Visit: Payer: Self-pay | Admitting: *Deleted

## 2018-04-14 LAB — CBC AND DIFFERENTIAL
HCT: 34 — AB (ref 36–46)
Hemoglobin: 11.4 — AB (ref 12.0–16.0)
Platelets: 179 (ref 150–399)
WBC: 3.8

## 2018-04-14 LAB — COMPLETE METABOLIC PANEL WITH GFR
ALBUMIN: 3.5
Calcium: 8.6
Carbon Dioxide, Total: 29
Chloride: 109
GLOBULIN: 1.9
Protein: 5.4

## 2018-04-14 LAB — BASIC METABOLIC PANEL
BUN: 33 — AB (ref 4–21)
CREATININE: 1.3 — AB (ref <=1.1)
Glucose: 84
POTASSIUM: 4.9 (ref 3.4–5.3)
SODIUM: 142 (ref 137–147)

## 2018-04-14 LAB — HEPATIC FUNCTION PANEL
ALK PHOS: 53 (ref 25–125)
ALT: 13 (ref 7–35)
AST: 16 (ref 13–35)
Bilirubin, Total: 0.6

## 2018-05-10 ENCOUNTER — Encounter: Payer: Self-pay | Admitting: Internal Medicine

## 2018-05-10 ENCOUNTER — Non-Acute Institutional Stay (SKILLED_NURSING_FACILITY): Payer: Medicare Other | Admitting: Internal Medicine

## 2018-05-10 DIAGNOSIS — F0150 Vascular dementia without behavioral disturbance: Secondary | ICD-10-CM

## 2018-05-10 DIAGNOSIS — E782 Mixed hyperlipidemia: Secondary | ICD-10-CM

## 2018-05-10 DIAGNOSIS — F0280 Dementia in other diseases classified elsewhere without behavioral disturbance: Secondary | ICD-10-CM | POA: Diagnosis not present

## 2018-05-10 DIAGNOSIS — G309 Alzheimer's disease, unspecified: Secondary | ICD-10-CM

## 2018-05-10 DIAGNOSIS — E440 Moderate protein-calorie malnutrition: Secondary | ICD-10-CM | POA: Diagnosis not present

## 2018-05-10 DIAGNOSIS — N183 Chronic kidney disease, stage 3 unspecified: Secondary | ICD-10-CM

## 2018-05-10 DIAGNOSIS — K5909 Other constipation: Secondary | ICD-10-CM

## 2018-05-10 NOTE — Progress Notes (Signed)
Location:  [facility] Room Number: 50 Place of Service:  SNF [phone]) Provider:  Blanchie Serve MD  Blanchie Serve, MD  Patient Care Team: Blanchie Serve, MD as PCP - General (Internal Medicine) [place] Minus Breeding, MD as Consulting Physician (Cardiology) Nathanial Rancher, MD as Consulting Physician (Ophthalmology) Leta Baptist, MD as Consulting Physician (Otolaryngology) Mast, Man X, NP as Nurse Practitioner (Internal Medicine)  Extended Emergency Contact Information Primary Emergency Contact: Vinson Moselle of [place] Phone: [phone] Mobile Phone: [phone] Relation: Friend Secondary Emergency Contact: Daniel,Gloria Address: [place], [place] of [place] Phone: [phone] Mobile Phone: [phone] Relation: Friend  Code Status:  DNR  Goals of care: Advanced Directive information Advanced Directives 04/13/2018  Does Patient Have a Medical Advance Directive? Yes  Type of Paramedic of [place];Living will;Out of facility DNR (pink MOST or yellow form)  Does patient want to make changes to medical advance directive? No - Patient declined  Copy of [place] in Chart? Yes  Pre-existing out of facility DNR order (yellow form or pink MOST form) Yellow form placed in chart (order not valid for inpatient use)     Chief Complaint  Patient presents with  . Medical Management of Chronic Issues    Routine Visit     HPI:  Pt is a 84 y.o. female seen today for medical management of chronic diseases.  She denies any acute health concern. No acute concern from nursing.   Dementia- able to express her needs. Needs assistance with her ADLs. Currently on donepezil 5 mg daily and memantine 10 mg bid  Constipation- milk of magnesia has been helpful  Hyperlipidemia- takes atorvastatin 10 mg daily, has history of CVA  Protein calorie  malnutrition- takes feeding supplement, fair po intake per nursing.    Past Medical History:  Diagnosis Date  . Alzheimer's disease 01/10/2015   MMSE 25/30  . Anxiety    mild  . Anxiety 01/11/2014  . Aortic stenosis    a. Echo 03/2010:  EF 70-75%, mild AS  . Cerebral atrophy 01/10/2015  . Cerebrovascular disease 11/01/2005   Small vessel disease  . Chronic neck pain 08/06/2011  . Closed pelvic fracture ([place]) 09/29/12   left nondisplaced inferior pelvic ramus  . Glaucoma    "both eyes; no vision in left eye as a result" (04/06/2013)  . History of recurrent UTIs   . Hypertension   . Osteoarthritis 08/06/2011  . Pneumonia 04/06/2013   "for the first time" (04/06/2013)  . PVC (premature ventricular contraction)   . Senile osteoporosis 04/08/2011  . SVT (supraventricular tachycardia) ([place])   . Thumb pain 08/06/2011  . Vision loss of left eye    "can't see at all out of it" (04/06/2013)   Past Surgical History:  Procedure Laterality Date  . APPENDECTOMY    . CATARACT EXTRACTION, BILATERAL Bilateral   . GLAUCOMA SURGERY Left    "put in stent for glaucoma" (04/06/2013)  . TONSILLECTOMY      Allergies  Allergen Reactions  . Codeine Nausea Only  . Sulfate     Outpatient Encounter Medications as of 05/10/2018  Medication Sig  . acetaminophen (TYLENOL) 325 MG tablet Take 650 mg by mouth every 4 (four) hours as needed.   Marland Kitchen atorvastatin (LIPITOR) 10 MG tablet Take 10 mg by mouth daily.  . Calcium Citrate-Vitamin D 315-250 MG-UNIT TABS Take 1  tablet by mouth 2 (two) times daily.   . cholecalciferol (VITAMIN D) 1000 units tablet Take 1,000 Units by mouth daily.  Marland Kitchen donepezil (ARICEPT) 5 MG tablet Take 5 mg by mouth at bedtime.  . feeding supplement (BOOST / RESOURCE BREEZE) LIQD Take 1 Container by mouth 2 (two) times daily.  . magnesium hydroxide (MILK OF MAGNESIA) 400 MG/5ML suspension Take 30 mLs by mouth at bedtime.  . memantine (NAMENDA) 10 MG tablet Take 10 mg by mouth 2 (two) times daily.   Vladimir Faster Glycol-Propyl Glycol (SYSTANE) 0.4-0.3 % GEL ophthalmic gel Place 1 application into both eyes 4 (four) times daily. For dryness    No facility-administered encounter medications on file as of 05/10/2018.     Review of Systems  Unable to perform ROS: Dementia  Constitutional: Negative for chills and fever.  HENT: Positive for hearing loss. Negative for congestion, mouth sores, rhinorrhea and trouble swallowing.   Eyes: Positive for visual disturbance.       Poor vision to left eye  Respiratory: Negative for cough and shortness of breath.   Cardiovascular: Negative for chest pain.  Gastrointestinal: Negative for abdominal pain, diarrhea and vomiting.  Genitourinary: Negative for dysuria.       Has urinary incontinence  Musculoskeletal: Positive for gait problem. Negative for arthralgias.       Ambulates with 4 wheel walker with seat and brake and a cane at times. No fall reported.  Neurological: Negative for dizziness and headaches.  Psychiatric/Behavioral: Positive for confusion and decreased concentration.    Immunization History  Administered Date(s) Administered  . Influenza Split 09/22/2011, 09/13/2012  . Influenza-Unspecified 08/09/2013, 08/27/2014, 08/08/2015, 08/25/2016, 08/31/2017  . Tdap 09/13/2012   Pertinent  Health Maintenance Due  Topic Date Due  . HEMOGLOBIN A1C  05-17-19  . FOOT EXAM  03/03[birth year]1929  . OPHTHALMOLOGY EXAM  03/03/1929  . URINE MICROALBUMIN  03/03/1929  . DEXA SCAN  03/03/1984  . PNA vac Low Risk Adult (1 of 2 - PCV13) 03/03/1984  . INFLUENZA VACCINE  06/09/2018   Fall Risk  06/29/2017 03/21/2015 08/23/2014 09/29/2012 09/29/2012  Falls in the past year? - No No No No  Number falls in past yr: 1 - - - -  Injury with Fall? No - - - -   Functional Status Survey:    Vitals:   05/10/18 1538  BP: (!) 148/77  Pulse: 81  Resp: 20  Temp: 99.1 F (37.3 C)  TempSrc: Oral  SpO2: 95%  Weight: 112 lb 1.6 oz (50.8 kg)  Height: 5\' 4"  (1.626  m)   Body mass index is 19.24 kg/m.   Wt Readings from Last 3 Encounters:  05/10/18 112 lb 1.6 oz (50.8 kg)  04/13/18 112 lb 1.6 oz (50.8 kg)  03/15/18 112 lb 1.6 oz (50.8 kg)   Physical Exam  Constitutional:  Thin built, frail elderly female in no acute distress  HENT:  Head: Normocephalic and atraumatic.  Right Ear: External ear normal.  Left Ear: External ear normal.  Nose: Nose normal.  Mouth/Throat: Oropharynx is clear and moist.  Eyes: Conjunctivae and EOM are normal. Right eye exhibits no discharge. Left eye exhibits no discharge.  Poor vision to left eye  Neck: Normal range of motion. Neck supple.  Cardiovascular: Normal rate and regular rhythm.  Murmur heard. Pulmonary/Chest: Effort normal and breath sounds normal. No respiratory distress. She has no wheezes.  Abdominal: Soft. Bowel sounds are normal. There is no tenderness. There is no guarding.  Musculoskeletal: Normal range  of motion. She exhibits no edema.  Lymphadenopathy:    She has no cervical adenopathy.  Neurological: She is alert.  Oriented to place and self  Skin: Skin is warm. Capillary refill takes less than 2 seconds. She is not diaphoretic. No erythema.  Psychiatric: She has a normal mood and affect.    Labs reviewed: Recent Labs    09/14/17 11/11/17 04/14/18  NA 140 139 142  K 4.5 4.5 4.9  CL  --   --  109  CO2  --   --  29  BUN 29* 33* 33*  CREATININE 1.1 0.7 1.3*  CALCIUM  --   --  8.6   Recent Labs    11/11/17 12/30/17 04/14/18  AST 17 19 16   ALT 10 16 13   ALKPHOS 63 63 53  ALBUMIN  --   --  3.5   Recent Labs    09/14/17 11/11/17 04/14/18  WBC 3.4 5.7 3.8  HGB 11.8* 12.6 11.4*  HCT 34* 37 34*  PLT 169 320 179   Lab Results  Component Value Date   TSH 2.34 09/14/2017   No results found for: HGBA1C Lab Results  Component Value Date   CHOL 174 12/30/2017   HDL 89 (A) 12/30/2017   LDLCALC 75 12/30/2017   TRIG 34 (A) 12/30/2017    Significant Diagnostic Results in last 30  days:  No results found.  Assessment/Plan  1. Mixed Alzheimer's and vascular dementia C/w aricept and namenda, assistance with ADLs, fall precautions, pressure ulcer prophylaxis and nutritional supplement  2. Chronic constipation Continue current bowel regimen and monitor  3. Protein-calorie malnutrition, moderate (HCC) Continue feeding supplement, supportive care  4. Hyperlipidemia, mixed Continue statin.   5. CKD (chronic kidney disease) stage 3, GFR 30-59 ml/min (HCC) Monitor renal function periodically. Has anemia of chronic disease. stable  Family/ staff Communication: reviewed care plan with patient and charge nurse.    Labs/tests ordered:  none   Blanchie Serve, MD Internal Medicine [facility] [address] [place],  [address] Cell Phone (Monday-Friday 8 am - 5 pm): [phone] On Call: [phone] and follow prompts after 5 pm and on weekends Office Phone: [phone] Office Fax: [phone]

## 2018-06-10 ENCOUNTER — Encounter: Payer: Self-pay | Admitting: Nurse Practitioner

## 2018-06-10 ENCOUNTER — Non-Acute Institutional Stay (SKILLED_NURSING_FACILITY): Payer: Medicare Other | Admitting: Nurse Practitioner

## 2018-06-10 DIAGNOSIS — F0150 Vascular dementia without behavioral disturbance: Secondary | ICD-10-CM | POA: Diagnosis not present

## 2018-06-10 DIAGNOSIS — K5909 Other constipation: Secondary | ICD-10-CM | POA: Diagnosis not present

## 2018-06-10 DIAGNOSIS — M810 Age-related osteoporosis without current pathological fracture: Secondary | ICD-10-CM

## 2018-06-10 DIAGNOSIS — G309 Alzheimer's disease, unspecified: Secondary | ICD-10-CM | POA: Diagnosis not present

## 2018-06-10 DIAGNOSIS — F0280 Dementia in other diseases classified elsewhere without behavioral disturbance: Secondary | ICD-10-CM

## 2018-06-10 NOTE — Assessment & Plan Note (Signed)
She resides in SNF [facility] for care assistance due to her dementia and physical frailty. Continue Memantine 10mg  bid, Donepezil 5mg  daily to preserve memory.

## 2018-06-10 NOTE — Progress Notes (Signed)
Location:  [facility] Room Number: 53 Place of Service:  SNF (31) Provider:  Koy Lamp, ManXie  NP  Blanchie Serve, MD  Patient Care Team: Blanchie Serve, MD as PCP - General (Internal Medicine) [place] Minus Breeding, MD as Consulting Physician (Cardiology) Nathanial Rancher, MD as Consulting Physician (Ophthalmology) Leta Baptist, MD as Consulting Physician (Otolaryngology) Fanchon Papania X, NP as Nurse Practitioner (Internal Medicine)  Extended Emergency Contact Information Primary Emergency Contact: Vinson Moselle of [place] Phone: [phone] Mobile Phone: [phone] Relation: Friend Secondary Emergency Contact: Daniel,Gloria Address: [place], [place] Phone: [phone] Mobile Phone: [phone] Relation: Friend  Code Status:  DNR Goals of care: Advanced Directive information Advanced Directives 06/10/2018  Does Patient Have a Medical Advance Directive? Yes  Type of Paramedic of [place];Living will;Out of facility DNR (pink MOST or yellow form)  Does patient want to make changes to medical advance directive? No - Patient declined  Copy of [place] in Chart? Yes  Pre-existing out of facility DNR order (yellow form or pink MOST form) Yellow form placed in chart (order not valid for inpatient use)     Chief Complaint  Patient presents with  . Medical Management of Chronic Issues    chronic constipation, HTN, mixed alzheimers disease,    HPI:  Pt is a 84 y.o. female seen today for medical management of chronic diseases.     The patient has history of constipation, stable on daily MOM. She resides in SNF [facility] for care assistance due to her dementia and physical frailty. On Memantine 10mg  bid, Donepezil 5mg  daily to preserve memory. Taking daily Vit D and Ca for osteoporosis.  Past Medical History:  Diagnosis Date  .  Alzheimer's disease 01/10/2015   MMSE 25/30  . Anxiety    mild  . Anxiety 01/11/2014  . Aortic stenosis    a. Echo 03/2010:  EF 70-75%, mild AS  . Cerebral atrophy 01/10/2015  . Cerebrovascular disease 11/01/2005   Small vessel disease  . Chronic neck pain 08/06/2011  . Closed pelvic fracture ([place]) 09/29/12   left nondisplaced inferior pelvic ramus  . Glaucoma    "both eyes; no vision in left eye as a result" (04/06/2013)  . History of recurrent UTIs   . Hypertension   . Osteoarthritis 08/06/2011  . Pneumonia 04/06/2013   "for the first time" (04/06/2013)  . PVC (premature ventricular contraction)   . Senile osteoporosis 04/08/2011  . SVT (supraventricular tachycardia) ([place])   . Thumb pain 08/06/2011  . Vision loss of left eye    "can't see at all out of it" (04/06/2013)   Past Surgical History:  Procedure Laterality Date  . APPENDECTOMY    . CATARACT EXTRACTION, BILATERAL Bilateral   . GLAUCOMA SURGERY Left    "put in stent for glaucoma" (04/06/2013)  . TONSILLECTOMY      Allergies  Allergen Reactions  . Codeine Nausea Only  . Sulfate     Outpatient Encounter Medications as of 06/10/2018  Medication Sig  . acetaminophen (TYLENOL) 325 MG tablet Take 650 mg by mouth every 4 (four) hours as needed.   Marland Kitchen atorvastatin (LIPITOR) 10 MG tablet Take 10 mg by mouth daily.  . Calcium Citrate-Vitamin D 315-250 MG-UNIT TABS Take 1 tablet by mouth 2 (two) times daily.   . cholecalciferol (VITAMIN D) 1000 units tablet Take 1,000 Units  by mouth daily.  Marland Kitchen donepezil (ARICEPT) 5 MG tablet Take 5 mg by mouth at bedtime.  . feeding supplement (BOOST / RESOURCE BREEZE) LIQD Take 1 Container by mouth 2 (two) times daily.  . magnesium hydroxide (MILK OF MAGNESIA) 400 MG/5ML suspension Take 30 mLs by mouth at bedtime.  . memantine (NAMENDA) 10 MG tablet Take 10 mg by mouth 2 (two) times daily.  Vladimir Faster Glycol-Propyl Glycol (SYSTANE) 0.4-0.3 % GEL ophthalmic gel Place 1 application into both eyes 4  (four) times daily. For dryness    No facility-administered encounter medications on file as of 06/10/2018.    ROS was provided with assistance of staff Review of Systems  Constitutional: Negative for activity change, appetite change, fatigue and fever.  HENT: Positive for hearing loss. Negative for congestion and voice change.   Eyes: Positive for visual disturbance.       Low vision.   Respiratory: Negative for cough, shortness of breath and wheezing.   Cardiovascular: Negative for chest pain, palpitations and leg swelling.  Gastrointestinal: Negative for abdominal distention, abdominal pain, constipation, diarrhea and vomiting.  Genitourinary: Negative for difficulty urinating, dysuria and urgency.  Musculoskeletal: Positive for arthralgias and gait problem.  Skin: Negative for color change and pallor.  Neurological: Negative for dizziness, speech difficulty, weakness and headaches.       Dementia.   Psychiatric/Behavioral: Positive for confusion. Negative for agitation, behavioral problems, hallucinations and sleep disturbance. The patient is not nervous/anxious.     Immunization History  Administered Date(s) Administered  . Influenza Split 09/22/2011, 09/13/2012  . Influenza-Unspecified 08/09/2013, 08/27/2014, 08/08/2015, 08/25/2016, 08/31/2017  . Tdap 09/13/2012   Pertinent  Health Maintenance Due  Topic Date Due  . HEMOGLOBIN A1C  Jul 31, 1917  . FOOT EXAM  03/03/1929  . OPHTHALMOLOGY EXAM  03/03/1929  . URINE MICROALBUMIN  03/03/1929  . DEXA SCAN  03/03/1984  . PNA vac Low Risk Adult (1 of 2 - PCV13) 03/03/1984  . INFLUENZA VACCINE  06/09/2018   Fall Risk  06/29/2017 03/21/2015 08/23/2014 09/29/2012 09/29/2012  Falls in the past year? - No No No No  Number falls in past yr: 1 - - - -  Injury with Fall? No - - - -   Functional Status Survey:    Vitals:   06/10/18 1447  BP: 140/78  Pulse: 80  Resp: 18  Temp: (!) 97.4 F (36.3 C)  Weight: 112 lb 8 oz (51 kg)    Height: 5\' 4"  (1.626 m)   Body mass index is 19.31 kg/m. Physical Exam  Constitutional: She appears well-developed and well-nourished.  Healthy weight  HENT:  Head: Normocephalic and atraumatic.  Eyes: Pupils are equal, round, and reactive to light. EOM are normal.  Left eye low vision.   Neck: Normal range of motion. Neck supple. No thyromegaly present.  Cardiovascular: Normal rate and regular rhythm.  Murmur heard. Pulmonary/Chest: Effort normal and breath sounds normal. She has no wheezes. She has no rales.  Abdominal: Soft. Bowel sounds are normal. She exhibits no distension. There is no tenderness. There is no rebound and no guarding.  Musculoskeletal: She exhibits no edema.  Ambulates with walker for a short distance, w/c to go further.   Neurological: She is alert.  Oriented to person and place.   Skin: Skin is warm and dry.  Psychiatric: She has a normal mood and affect. Her behavior is normal.    Labs reviewed: Recent Labs    09/14/17 11/11/17 04/14/18  NA 140 139 142  K  4.5 4.5 4.9  CL  --   --  109  CO2  --   --  29  BUN 29* 33* 33*  CREATININE 1.1 0.7 1.3*  CALCIUM  --   --  8.6   Recent Labs    11/11/17 12/30/17 04/14/18  AST 17 19 16   ALT 10 16 13   ALKPHOS 63 63 53  ALBUMIN  --   --  3.5   Recent Labs    09/14/17 11/11/17 04/14/18  WBC 3.4 5.7 3.8  HGB 11.8* 12.6 11.4*  HCT 34* 37 34*  PLT 169 320 179   Lab Results  Component Value Date   TSH 2.34 09/14/2017   No results found for: HGBA1C Lab Results  Component Value Date   CHOL 174 12/30/2017   HDL 89 (A) 12/30/2017   LDLCALC 75 12/30/2017   TRIG 34 (A) 12/30/2017    Significant Diagnostic Results in last 30 days:  No results found.  Assessment/Plan Chronic constipation  constipation, stable, continue daily MOM.   Mixed Alzheimer's and vascular dementia She resides in SNF [facility] for care assistance due to her dementia and physical frailty. Continue Memantine 10mg  bid, Donepezil 5mg   daily to preserve memory.   Osteoporosis Stable, continue  daily Vit D and Ca for osteoporosis.  T     Family/ staff Communication: plan of care reviewed with the patient and charge nurse.   Labs/tests ordered:  none  Time spend 25 minutes.

## 2018-06-10 NOTE — Assessment & Plan Note (Signed)
constipation, stable, continue daily MOM.

## 2018-06-10 NOTE — Assessment & Plan Note (Signed)
Stable, continue  daily Vit D and Ca for osteoporosis.  T

## 2018-06-22 ENCOUNTER — Encounter: Payer: Self-pay | Admitting: Internal Medicine

## 2018-07-05 ENCOUNTER — Non-Acute Institutional Stay (SKILLED_NURSING_FACILITY): Payer: Medicare Other

## 2018-07-05 DIAGNOSIS — Z0000 Encounter for general adult medical examination without abnormal findings: Secondary | ICD-10-CM

## 2018-07-05 NOTE — Patient Instructions (Signed)
Ms. Brittany Warren , Thank you for taking time to come for your Medicare Wellness Visit. I appreciate your ongoing commitment to your health goals. Please review the following plan we discussed and let me know if I can assist you in the future.   Screening recommendations/referrals: Colonoscopy excluded, over age 82 Mammogram excluded, over age 42 Bone Density due, ordered Recommended yearly ophthalmology/optometry visit for glaucoma screening and checkup Recommended yearly dental visit for hygiene and checkup  Vaccinations: Influenza vaccine due, will receive at [facility] Pneumococcal vaccine up to date, 23 due 08/19/2018 Tdap vaccine up to date, due 09/13/2022 Shingles vaccine not in past records    Advanced directives: in chart  Conditions/risks identified: none  Next appointment: Dr. Bubba Camp makes rounds   Preventive Care 34 Years and Older, Female Preventive care refers to lifestyle choices and visits with your health care provider that can promote health and wellness. What does preventive care include?  A yearly physical exam. This is also called an annual well check.  Dental exams once or twice a year.  Routine eye exams. Ask your health care provider how often you should have your eyes checked.  Personal lifestyle choices, including:  Daily care of your teeth and gums.  Regular physical activity.  Eating a healthy diet.  Avoiding tobacco and drug use.  Limiting alcohol use.  Practicing safe sex.  Taking low-dose aspirin every day.  Taking vitamin and mineral supplements as recommended by your health care provider. What happens during an annual well check? The services and screenings done by your health care provider during your annual well check will depend on your age, overall health, lifestyle risk factors, and family history of disease. Counseling  Your health care provider may ask you questions about your:  Alcohol use.  Tobacco use.  Drug use.  Emotional  well-being.  Home and relationship well-being.  Sexual activity.  Eating habits.  History of falls.  Memory and ability to understand (cognition).  Work and work Statistician.  Reproductive health. Screening  You may have the following tests or measurements:  Height, weight, and BMI.  Blood pressure.  Lipid and cholesterol levels. These may be checked every 5 years, or more frequently if you are over 22 years old.  Skin check.  Lung cancer screening. You may have this screening every year starting at age 4 if you have a 30-pack-year history of smoking and currently smoke or have quit within the past 15 years.  Fecal occult blood test (FOBT) of the stool. You may have this test every year starting at age 42.  Flexible sigmoidoscopy or colonoscopy. You may have a sigmoidoscopy every 5 years or a colonoscopy every 10 years starting at age 57.  Hepatitis C blood test.  Hepatitis B blood test.  Sexually transmitted disease (STD) testing.  Diabetes screening. This is done by checking your blood sugar (glucose) after you have not eaten for a while (fasting). You may have this done every 1-3 years.  Bone density scan. This is done to screen for osteoporosis. You may have this done starting at age 50.  Mammogram. This may be done every 1-2 years. Talk to your health care provider about how often you should have regular mammograms. Talk with your health care provider about your test results, treatment options, and if necessary, the need for more tests. Vaccines  Your health care provider may recommend certain vaccines, such as:  Influenza vaccine. This is recommended every year.  Tetanus, diphtheria, and acellular pertussis (Tdap, Td)  vaccine. You may need a Td booster every 10 years.  Zoster vaccine. You may need this after age 42.  Pneumococcal 13-valent conjugate (PCV13) vaccine. One dose is recommended after age 4.  Pneumococcal polysaccharide (PPSV23) vaccine. One  dose is recommended after age 2. Talk to your health care provider about which screenings and vaccines you need and how often you need them. This information is not intended to replace advice given to you by your health care provider. Make sure you discuss any questions you have with your health care provider. Document Released: 11/22/2015 Document Revised: 07/15/2016 Document Reviewed: 08/27/2015 Elsevier Interactive Patient Education  2017 [employer] Prevention in the Home Falls can cause injuries. They can happen to people of all ages. There are many things you can do to make your home safe and to help prevent falls. What can I do on the outside of my home?  Regularly fix the edges of walkways and driveways and fix any cracks.  Remove anything that might make you trip as you walk through a door, such as a raised step or threshold.  Trim any bushes or trees on the path to your home.  Use bright outdoor lighting.  Clear any walking paths of anything that might make someone trip, such as rocks or tools.  Regularly check to see if handrails are loose or broken. Make sure that both sides of any steps have handrails.  Any raised decks and porches should have guardrails on the edges.  Have any leaves, snow, or ice cleared regularly.  Use sand or salt on walking paths during winter.  Clean up any spills in your garage right away. This includes oil or grease spills. What can I do in the bathroom?  Use night lights.  Install grab bars by the toilet and in the tub and shower. Do not use towel bars as grab bars.  Use non-skid mats or decals in the tub or shower.  If you need to sit down in the shower, use a plastic, non-slip stool.  Keep the floor dry. Clean up any water that spills on the floor as soon as it happens.  Remove soap buildup in the tub or shower regularly.  Attach bath mats securely with double-sided non-slip rug tape.  Do not have throw rugs and other  things on the floor that can make you trip. What can I do in the bedroom?  Use night lights.  Make sure that you have a light by your bed that is easy to reach.  Do not use any sheets or blankets that are too big for your bed. They should not hang down onto the floor.  Have a firm chair that has side arms. You can use this for support while you get dressed.  Do not have throw rugs and other things on the floor that can make you trip. What can I do in the kitchen?  Clean up any spills right away.  Avoid walking on wet floors.  Keep items that you use a lot in easy-to-reach places.  If you need to reach something above you, use a strong step stool that has a grab bar.  Keep electrical cords out of the way.  Do not use floor polish or wax that makes floors slippery. If you must use wax, use non-skid floor wax.  Do not have throw rugs and other things on the floor that can make you trip. What can I do with my stairs?  Do not leave  any items on the stairs.  Make sure that there are handrails on both sides of the stairs and use them. Fix handrails that are broken or loose. Make sure that handrails are as long as the stairways.  Check any carpeting to make sure that it is firmly attached to the stairs. Fix any carpet that is loose or worn.  Avoid having throw rugs at the top or bottom of the stairs. If you do have throw rugs, attach them to the floor with carpet tape.  Make sure that you have a light switch at the top of the stairs and the bottom of the stairs. If you do not have them, ask someone to add them for you. What else can I do to help prevent falls?  Wear shoes that:  Do not have high heels.  Have rubber bottoms.  Are comfortable and fit you well.  Are closed at the toe. Do not wear sandals.  If you use a stepladder:  Make sure that it is fully opened. Do not climb a closed stepladder.  Make sure that both sides of the stepladder are locked into place.  Ask  someone to hold it for you, if possible.  Clearly mark and make sure that you can see:  Any grab bars or handrails.  First and last steps.  Where the edge of each step is.  Use tools that help you move around (mobility aids) if they are needed. These include:  Canes.  Walkers.  Scooters.  Crutches.  Turn on the lights when you go into a dark area. Replace any light bulbs as soon as they burn out.  Set up your furniture so you have a clear path. Avoid moving your furniture around.  If any of your floors are uneven, fix them.  If there are any pets around you, be aware of where they are.  Review your medicines with your doctor. Some medicines can make you feel dizzy. This can increase your chance of falling. Ask your doctor what other things that you can do to help prevent falls. This information is not intended to replace advice given to you by your health care provider. Make sure you discuss any questions you have with your health care provider. Document Released: 08/22/2009 Document Revised: 04/02/2016 Document Reviewed: 11/30/2014 Elsevier Interactive Patient Education  2017 [employer].

## 2018-07-05 NOTE — Progress Notes (Signed)
Subjective:   Brittany Warren is a 84 y.o. female who presents for Medicare Annual (Subsequent) preventive examination at [facility]  Last AWV-06/29/2017     Objective:     Vitals: BP 128/73 (BP Location: Left Arm, Patient Position: Supine)   Pulse 75   Temp 97.8 F (36.6 C) (Oral)   Ht 5\' 4"  (1.626 m)   Wt 112 lb (50.8 kg)   BMI 19.22 kg/m   Body mass index is 19.22 kg/m.  Advanced Directives 07/05/2018 06/10/2018 04/13/2018 03/15/2018 02/15/2018 01/19/2018 12/23/2017  Does Patient Have a Medical Advance Directive? Yes Yes Yes Yes Yes Yes Yes  Type of Paramedic of [place];Living will;Out of facility DNR (pink MOST or yellow form) [place];Living will;Out of facility DNR (pink MOST or yellow form) [place];Living will;Out of facility DNR (pink MOST or yellow form) [place];Living will;Out of facility DNR (pink MOST or yellow form) [place];Living will;Out of facility DNR (pink MOST or yellow form) [place];Living will;Out of facility DNR (pink MOST or yellow form) [place];Living will;Out of facility DNR (pink MOST or yellow form)  Does patient want to make changes to medical advance directive? No - Patient declined No - Patient declined No - Patient declined No - Patient declined No - Patient declined No - Patient declined No - Patient declined  Copy of [place] in Chart? Yes Yes Yes Yes Yes Yes Yes  Pre-existing out of facility DNR order (yellow form or pink MOST form) Yellow form placed in chart (order not valid for inpatient use) Yellow form placed in chart (order not valid for inpatient use) Yellow form placed in chart (order not valid for inpatient use) Yellow form placed in chart (order not valid for inpatient use) Yellow form placed in chart (order not valid for inpatient use) Yellow form placed in chart  (order not valid for inpatient use);Pink MOST form placed in chart (order not valid for inpatient use) Yellow form placed in chart (order not valid for inpatient use);Pink MOST form placed in chart (order not valid for inpatient use)    Tobacco Social History   Tobacco Use  Smoking Status Former Smoker  . Packs/day: 0.50  . Years: 30.00  . Pack years: 15.00  . Types: Cigarettes  . Last attempt to quit: 01/14/1984  . Years since quitting: 34.4  Smokeless Tobacco Never Used     Counseling given: Not Answered   Clinical Intake:  Pre-visit preparation completed: No  Pain : No/denies pain     Diabetes: No  How often do you need to have someone help you when you read instructions, pamphlets, or other written materials from your doctor or pharmacy?: 3 - Sometimes  Interpreter Needed?: No  Information entered by :: Tyson Dense, RN  Past Medical History:  Diagnosis Date  . Alzheimer's disease 01/10/2015   MMSE 25/30  . Anxiety    mild  . Anxiety 01/11/2014  . Aortic stenosis    a. Echo 03/2010:  EF 70-75%, mild AS  . Cerebral atrophy 01/10/2015  . Cerebrovascular disease 11/01/2005   Small vessel disease  . Chronic neck pain 08/06/2011  . Closed pelvic fracture ([place]) 09/29/12   left nondisplaced inferior pelvic ramus  . Glaucoma    "both eyes; no vision in left eye as a result" (04/06/2013)  . History of recurrent UTIs   . Hypertension   .  Osteoarthritis 08/06/2011  . Pneumonia 04/06/2013   "for the first time" (04/06/2013)  . PVC (premature ventricular contraction)   . Senile osteoporosis 04/08/2011  . SVT (supraventricular tachycardia) ([place])   . Thumb pain 08/06/2011  . Vision loss of left eye    "can't see at all out of it" (04/06/2013)   Past Surgical History:  Procedure Laterality Date  . APPENDECTOMY    . CATARACT EXTRACTION, BILATERAL Bilateral   . GLAUCOMA SURGERY Left    "put in stent for glaucoma" (04/06/2013)  . TONSILLECTOMY     History reviewed. No  pertinent family history. Social History   Socioeconomic History  . Marital status: Widowed    Spouse name: Not on file  . Number of children: Not on file  . Years of education: Not on file  . Highest education level: Not on file  Occupational History  . Occupation: retired Tour manager  . Financial resource strain: Not hard at all  . Food insecurity:    Worry: Never true    Inability: Never true  . Transportation needs:    Medical: No    Non-medical: No  Tobacco Use  . Smoking status: Former Smoker    Packs/day: 0.50    Years: 30.00    Pack years: 15.00    Types: Cigarettes    Last attempt to quit: 01/14/1984    Years since quitting: 34.4  . Smokeless tobacco: Never Used  Substance and Sexual Activity  . Alcohol use: Yes    Alcohol/week: 7.0 standard drinks    Types: 7 Glasses of wine per week    Comment: 4 oz every evening as request  . Drug use: No  . Sexual activity: Never  Lifestyle  . Physical activity:    Days per week: 0 days    Minutes per session: 0 min  . Stress: Only a little  Relationships  . Social connections:    Talks on phone: Three times a week    Gets together: Three times a week    Attends religious service: Never    Active member of club or organization: No    Attends meetings of clubs or organizations: Never    Relationship status: Widowed  Other Topics Concern  . Not on file  Social History Narrative   Lives at [facility],  [place] 08/08/15   Widowed   Previously smoked 1/2 PPD, smoked 30-40 years stopped [place] with walker   Exercise with machines, and walk   Alcohol occasionally    DNR, POA   Body has been bequeathed to [facility].              Outpatient Encounter Medications as of 07/05/2018  Medication Sig  . acetaminophen (TYLENOL) 325 MG tablet Take 650 mg by mouth every 4 (four) hours as needed.   Marland Kitchen atorvastatin (LIPITOR) 10 MG tablet Take 10 mg by mouth daily.  .  Calcium Citrate-Vitamin D 315-250 MG-UNIT TABS Take 1 tablet by mouth 2 (two) times daily.   . cholecalciferol (VITAMIN D) 1000 units tablet Take 1,000 Units by mouth daily.  Marland Kitchen donepezil (ARICEPT) 5 MG tablet Take 5 mg by mouth at bedtime.  . feeding supplement (BOOST / RESOURCE BREEZE) LIQD Take 1 Container by mouth 2 (two) times daily.  . magnesium hydroxide (MILK OF MAGNESIA) 400 MG/5ML suspension Take 30 mLs by mouth at bedtime.  . memantine (NAMENDA) 10 MG tablet Take 10 mg  by mouth 2 (two) times daily.  Vladimir Faster Glycol-Propyl Glycol (SYSTANE) 0.4-0.3 % GEL ophthalmic gel Place 1 application into both eyes 4 (four) times daily. For dryness    No facility-administered encounter medications on file as of 07/05/2018.     Activities of Daily Living In your present state of health, do you have any difficulty performing the following activities: 07/05/2018  Hearing? Y  Vision? Y  Difficulty concentrating or making decisions? Y  Walking or climbing stairs? Y  Dressing or bathing? N  Doing errands, shopping? Y  Preparing Food and eating ? Y  Using the Toilet? N  In the past six months, have you accidently leaked urine? N  Do you have problems with loss of bowel control? N  Managing your Medications? Y  Managing your Finances? Y  Housekeeping or managing your Housekeeping? Y  Some recent data might be hidden    Patient Care Team: Blanchie Serve, MD as PCP - General (Internal Medicine) [place] Minus Breeding, MD as Consulting Physician (Cardiology) Nathanial Rancher, MD as Consulting Physician (Ophthalmology) Leta Baptist, MD as Consulting Physician (Otolaryngology) Mast, Man X, NP as Nurse Practitioner (Internal Medicine)    Assessment:   This is a routine wellness examination for [place].  Exercise Activities and Dietary recommendations Current Exercise Habits: The patient does not participate in regular exercise at present, Exercise limited by: orthopedic  condition(s);neurologic condition(s)  Goals   None     Fall Risk Fall Risk  07/05/2018 06/29/2017 03/21/2015 08/23/2014 09/29/2012  Falls in the past year? No - No No No  Number falls in past yr: - 1 - - -  Injury with Fall? - No - - -   Is the patient's home free of loose throw rugs in walkways, pet beds, electrical cords, etc?   yes      Grab bars in the bathroom? yes      Handrails on the stairs?   yes      Adequate lighting?   yes  Depression Screen PHQ 2/9 Scores 07/05/2018 06/29/2017 03/21/2015 08/23/2014  PHQ - 2 Score 1 4 0 0  PHQ- 9 Score - 8 - -     Cognitive Function MMSE - Mini Mental State Exam 07/05/2018 06/29/2017 01/02/2016 01/10/2015  Orientation to time 0 0 2 3  Orientation to Place 4 5 4 5   Registration 3 3 3 3   Attention/ Calculation 4 4 5 4   Recall 0 0 3 2  Language- name 2 objects 2 2 2 2   Language- repeat 1 1 1 1   Language- follow 3 step command 3 3 3 3   Language- read & follow direction 1 0 1 1  Write a sentence 1 1 1 1   Copy design 0 0 0 0  Total score 19 19 25 25         Immunization History  Administered Date(s) Administered  . Influenza Split 09/22/2011, 09/13/2012  . Influenza-Unspecified 08/09/2013, 08/27/2014, 08/08/2015, 08/25/2016, 08/31/2017  . Pneumococcal Conjugate-13 08/19/2017  . Tdap 09/13/2012    Qualifies for Shingles Vaccine? Not in past records  Screening Tests Health Maintenance  Topic Date Due  . HEMOGLOBIN A1C  16-Nov-1916  . FOOT EXAM  03[birth year]03[birth year]1929  . OPHTHALMOLOGY EXAM  03/03/1929  . URINE MICROALBUMIN  03/03/1929  . DEXA SCAN  03/03/1984  . INFLUENZA VACCINE  06/09/2018  . PNA vac Low Risk Adult (2 of 2 - PPSV23) 08/19/2018  . TETANUS/TDAP  09/13/2022    Cancer Screenings: Lung: Low Dose  CT Chest recommended if Age 4-80 years, 30 pack-year currently smoking OR have quit w/in 15years. Patient does not qualify. Breast:  Up to date on Mammogram? Yes   Up to date of Bone Density/Dexa? No, ordered Colorectal: up to  date  Additional Screenings:  Hepatitis C Screening: declined Flu vaccine due: will receive at [place]:    I have personally reviewed and addressed the Medicare Annual Wellness questionnaire and have noted the following in the patient's chart:  A. Medical and social history B. Use of alcohol, tobacco or illicit drugs  C. Current medications and supplements D. Functional ability and status E.  Nutritional status F.  Physical activity G. Advance directives H. List of other physicians I.  Hospitalizations, surgeries, and ER visits in previous 12 months J.  [place] to include hearing, vision, cognitive, depression L. Referrals and appointments - none  In addition, I have reviewed and discussed with patient certain preventive protocols, quality metrics, and best practice recommendations. A written personalized care plan for preventive services as well as general preventive health recommendations were provided to patient.  See attached scanned questionnaire for additional information.   Signed,   Tyson Dense, RN Nurse Health Advisor  Patient Concerns: None

## 2018-07-14 ENCOUNTER — Encounter: Payer: Self-pay | Admitting: Nurse Practitioner

## 2018-07-14 ENCOUNTER — Non-Acute Institutional Stay (SKILLED_NURSING_FACILITY): Payer: Medicare Other | Admitting: Nurse Practitioner

## 2018-07-14 DIAGNOSIS — G301 Alzheimer's disease with late onset: Secondary | ICD-10-CM | POA: Diagnosis not present

## 2018-07-14 DIAGNOSIS — M810 Age-related osteoporosis without current pathological fracture: Secondary | ICD-10-CM

## 2018-07-14 DIAGNOSIS — E782 Mixed hyperlipidemia: Secondary | ICD-10-CM

## 2018-07-14 DIAGNOSIS — K5909 Other constipation: Secondary | ICD-10-CM

## 2018-07-14 DIAGNOSIS — F0280 Dementia in other diseases classified elsewhere without behavioral disturbance: Secondary | ICD-10-CM | POA: Diagnosis not present

## 2018-07-14 NOTE — Assessment & Plan Note (Signed)
Stable, continue MOM 37ml qhs.

## 2018-07-14 NOTE — Assessment & Plan Note (Signed)
Continue SNF [facility] for safety and care assistance, continue Memantine 10mg bid, Donepezil 5mg qd for memory.  

## 2018-07-14 NOTE — Assessment & Plan Note (Signed)
Controlled, last LDL 75 12/30/17, continue Atorvastatin 10mg  qd.

## 2018-07-14 NOTE — Progress Notes (Addendum)
Location:   SNF [facility] Room Number: 70 Place of Service:  SNF (31) Provider: Lennie Odor Cristyn Crossno NP  Blanchie Serve, MD  Patient Care Team: Blanchie Serve, MD as PCP - General (Internal Medicine) [place] Minus Breeding, MD as Consulting Physician (Cardiology) Nathanial Rancher, MD as Consulting Physician (Ophthalmology) Leta Baptist, MD as Consulting Physician (Otolaryngology) Timica Marcom X, NP as Nurse Practitioner (Internal Medicine)  Extended Emergency Contact Information Primary Emergency Contact: Vinson [place] of [place] Phone: [phone] Mobile Phone: [phone] Relation: Friend Secondary Emergency Contact: Daniel,Gloria Address: [place], [place] Phone: [phone] Mobile Phone: [phone] Relation: Friend  Code Status:  DNR Goals of care: Advanced Directive information Advanced Directives 07/14/2018  Does Patient Have a Medical Advance Directive? Yes  Type of Paramedic of [place];Living will;Out of facility DNR (pink MOST or yellow form)  Does patient want to make changes to medical advance directive? No - Patient declined  Copy of [place] in Chart? Yes  Pre-existing out of facility DNR order (yellow form or pink MOST form) Yellow form placed in chart (order not valid for inpatient use)     Chief Complaint  Patient presents with  . Medical Management of Chronic Issues    F/u-constipation, Alzheimers, CKD, HTN,     HPI:  Pt is a 84 y.o. female seen today for medical management of chronic diseases.     The patient has history of Hyperlipidemia, on Atorvastatin 10mg  qd, last LDL 75 12/30/17. [facility] in [facility] for safety and care assistance, self transfer, ambulates with walker, on Memantine 10mg  bid, Donepezil 5mg  qd for memory. Her constipation is well managed with MOM 55ml qd.   Past Medical History:  Diagnosis Date   . Alzheimer's disease 01/10/2015   MMSE 25/30  . Anxiety    mild  . Anxiety 01/11/2014  . Aortic stenosis    a. Echo 03/2010:  EF 70-75%, mild AS  . Cerebral atrophy 01/10/2015  . Cerebrovascular disease 11/01/2005   Small vessel disease  . Chronic neck pain 08/06/2011  . Closed pelvic fracture ([place]) 09/29/12   left nondisplaced inferior pelvic ramus  . Glaucoma    "both eyes; no vision in left eye as a result" (04/06/2013)  . History of recurrent UTIs   . Hypertension   . Osteoarthritis 08/06/2011  . Pneumonia 04/06/2013   "for the first time" (04/06/2013)  . PVC (premature ventricular contraction)   . Senile osteoporosis 04/08/2011  . SVT (supraventricular tachycardia) ([place])   . Thumb pain 08/06/2011  . Vision loss of left eye    "can't see at all out of it" (04/06/2013)   Past Surgical History:  Procedure Laterality Date  . APPENDECTOMY    . CATARACT EXTRACTION, BILATERAL Bilateral   . GLAUCOMA SURGERY Left    "put in stent for glaucoma" (04/06/2013)  . TONSILLECTOMY      Allergies  Allergen Reactions  . Codeine Nausea Only  . Sulfate     Allergies as of 07/14/2018      Reactions   Codeine Nausea Only   Sulfate       Medication List        Accurate as of 07/14/18 11:59 PM. Always use your most recent med list.          acetaminophen 325 MG tablet Commonly known as:  TYLENOL Take 650 mg by  mouth every 4 (four) hours as needed.   atorvastatin 10 MG tablet Commonly known as:  LIPITOR Take 10 mg by mouth daily.   Calcium Citrate-Vitamin D 315-250 MG-UNIT Tabs Take 1 tablet by mouth 2 (two) times daily.   cholecalciferol 1000 units tablet Commonly known as:  VITAMIN D Take 1,000 Units by mouth daily.   donepezil 5 MG tablet Commonly known as:  ARICEPT Take 5 mg by mouth at bedtime.   feeding supplement Liqd Take 1 Container by mouth 2 (two) times daily.   magnesium hydroxide 400 MG/5ML suspension Commonly known as:  MILK OF MAGNESIA Take 30 mLs by mouth at  bedtime.   memantine 10 MG tablet Commonly known as:  NAMENDA Take 10 mg by mouth 2 (two) times daily.   sodium chloride 5 % ophthalmic ointment Commonly known as:  MURO 132 Place 1 application into the left eye at bedtime.   SYSTANE 0.4-0.3 % Gel ophthalmic gel Generic drug:  Polyethyl Glycol-Propyl Glycol Place 1 application into both eyes 4 (four) times daily. For dryness      ROS was provided with assistance of staff Review of Systems  Constitutional: Negative for activity change, appetite change, chills, diaphoresis, fatigue, fever and unexpected weight change.  HENT: Positive for hearing loss. Negative for congestion and voice change.   Eyes:       Lower vision left eye  Respiratory: Negative for cough, shortness of breath and wheezing.   Cardiovascular: Negative for chest pain, palpitations and leg swelling.  Gastrointestinal: Negative for abdominal distention, abdominal pain, constipation, diarrhea, nausea and vomiting.  Genitourinary: Negative for difficulty urinating, dysuria and urgency.  Musculoskeletal: Positive for arthralgias and gait problem.  Skin: Negative for color change and pallor.  Neurological: Negative for dizziness, speech difficulty, weakness and headaches.       Memory lapses.   Psychiatric/Behavioral: Positive for confusion. Negative for agitation, behavioral problems and sleep disturbance. The patient is not nervous/anxious.     Immunization History  Administered Date(s) Administered  . Influenza Split 09/22/2011, 09/13/2012  . Influenza-Unspecified 08/09/2013, 08/27/2014, 08/08/2015, 08/25/2016, 08/31/2017  . Pneumococcal Conjugate-13 08/19/2017  . Tdap 09/13/2012   Pertinent  Health Maintenance Due  Topic Date Due  . HEMOGLOBIN A1C  02-Jan-1917  . FOOT EXAM  03/03/1929  . OPHTHALMOLOGY EXAM  03/03/1929  . URINE MICROALBUMIN  03/03/1929  . INFLUENZA VACCINE  06/09/2018  . PNA vac Low Risk Adult (2 of 2 - PPSV23) 08/19/2018  . DEXA SCAN   Completed   Fall Risk  07/05/2018 06/29/2017 03/21/2015 08/23/2014 09/29/2012  Falls in the past year? No - No No No  Number falls in past yr: - 1 - - -  Injury with Fall? - No - - -   Functional Status Survey:    Vitals:   07/14/18 1139  BP: 114/60  Pulse: 72  Resp: 20  Temp: 98.5 F (36.9 C)  Weight: 114 lb 6.4 oz (51.9 kg)  Height: 5\' 4"  (1.626 m)   Body mass index is 19.64 kg/m. Physical Exam  Constitutional: She appears well-developed and well-nourished.  HENT:  Head: Normocephalic and atraumatic.  Eyes: Pupils are equal, round, and reactive to light. EOM are normal.  Legally blind left eye  Neck: Normal range of motion. No JVD present. No thyromegaly present.  Cardiovascular: Normal rate and regular rhythm.  Murmur heard. Pulmonary/Chest: Effort normal. She has no wheezes. She has no rales.  Abdominal: Soft. Bowel sounds are normal. She exhibits no distension. There is no  tenderness. There is no rebound and no guarding.  Musculoskeletal: She exhibits no edema.  Kyphosis. Self transfer. Ambulates with walker.   Neurological: She is alert. No cranial nerve deficit. She exhibits normal muscle tone. Coordination normal.  Oriented to person and place.   Skin: Skin is warm and dry.  Psychiatric: She has a normal mood and affect. Her behavior is normal.    Labs reviewed: Recent Labs    09/14/17 11/11/17 04/14/18  NA 140 139 142  K 4.5 4.5 4.9  CL  --   --  109  CO2  --   --  29  BUN 29* 33* 33*  CREATININE 1.1 0.7 1.3*  CALCIUM  --   --  8.6   Recent Labs    11/11/17 12/30/17 04/14/18  AST 17 19 16   ALT 10 16 13   ALKPHOS 63 63 53  ALBUMIN  --   --  3.5   Recent Labs    09/14/17 11/11/17 04/14/18  WBC 3.4 5.7 3.8  HGB 11.8* 12.6 11.4*  HCT 34* 37 34*  PLT 169 320 179   Lab Results  Component Value Date   TSH 2.34 09/14/2017   No results found for: HGBA1C Lab Results  Component Value Date   CHOL 174 12/30/2017   HDL 89 (A) 12/30/2017   LDLCALC 75  12/30/2017   TRIG 34 (A) 12/30/2017    Significant Diagnostic Results in last 30 days:  No results found.  Assessment/Plan  Hyperlipidemia, mixed Controlled, last LDL 75 12/30/17, continue Atorvastatin 10mg  qd.   Alzheimer's disease Continue SNF [facility] for safety and care assistance, continue Memantine 10mg  bid, Donepezil 5mg  qd for memory.   Chronic constipation Stable, continue MOM 21ml qhs.   Osteoporosis 07/19/18 DEXA t-score -3.2 07/20/18 recommend Fosamax weekly if POA consents.    Family/ staff Communication: plan of care reviewed with the patient and charge nurse.   Labs/tests ordered:  none  Time spend 25 minutes

## 2018-07-20 NOTE — Assessment & Plan Note (Signed)
07/19/18 DEXA t-score -3.2 07/20/18 recommend Fosamax weekly if POA consents.

## 2018-08-09 ENCOUNTER — Non-Acute Institutional Stay (SKILLED_NURSING_FACILITY): Payer: Medicare Other | Admitting: Nurse Practitioner

## 2018-08-09 ENCOUNTER — Encounter: Payer: Self-pay | Admitting: Nurse Practitioner

## 2018-08-09 DIAGNOSIS — K5909 Other constipation: Secondary | ICD-10-CM | POA: Diagnosis not present

## 2018-08-09 DIAGNOSIS — G309 Alzheimer's disease, unspecified: Secondary | ICD-10-CM

## 2018-08-09 DIAGNOSIS — R2681 Unsteadiness on feet: Secondary | ICD-10-CM | POA: Diagnosis not present

## 2018-08-09 DIAGNOSIS — F0280 Dementia in other diseases classified elsewhere without behavioral disturbance: Secondary | ICD-10-CM

## 2018-08-09 DIAGNOSIS — F0150 Vascular dementia without behavioral disturbance: Secondary | ICD-10-CM | POA: Diagnosis not present

## 2018-08-09 NOTE — Assessment & Plan Note (Signed)
Stable, continue SNF [facility] for safety and care assistance. Continue Memantine 10mg  bid, Donepezil 5mg  qd for memory.

## 2018-08-09 NOTE — Assessment & Plan Note (Signed)
Stable, continue MOM 39ml qhs po.

## 2018-08-09 NOTE — Progress Notes (Signed)
Location:  [facility] Room Number: 41 Place of Service:  SNF (31) Provider:  Marlana Latus  NP  Mailani Degroote X, NP  Patient Care Team: Tamre Cass X, NP as PCP - General (Internal Medicine) [place] Minus Breeding, MD as Consulting Physician (Cardiology) Nathanial Rancher, MD as Consulting Physician (Ophthalmology) Leta Baptist, MD as Consulting Physician (Otolaryngology) Kamaiya Antilla X, NP as Nurse Practitioner (Internal Medicine)  Extended Emergency Contact Information Primary Emergency Contact: McGreggor,Wynn  Johnnette Litter of [place] Phone: [phone] Mobile Phone: [phone] Relation: Friend Secondary Emergency Contact: Daniel,Gloria Address: [place], [place] of [place] Phone: [phone] Mobile Phone: [phone] Relation: Friend  Code Status:  DNR Goals of care: Advanced Directive information Advanced Directives 08/09/2018  Does Patient Have a Medical Advance Directive? Yes  Type of Paramedic of [place];Living will;Out of facility DNR (pink MOST or yellow form)  Does patient want to make changes to medical advance directive? No - Patient declined  Copy of [place] in Chart? Yes  Pre-existing out of facility DNR order (yellow form or pink MOST form) Yellow form placed in chart (order not valid for inpatient use)     Chief Complaint  Patient presents with  . Medical Management of Chronic Issues    F/u- Alzheimers, HTN,hyperlipedemia, constipation,     HPI:  Pt is a 84 y.o. female seen today for medical management of chronic diseases.     The patient has history of constipation, on MOM 30mg  qhs, stable. She resides in [facility] for safety and care assistance, self transfers, ambulates with walker, on Memantine 10mg  bid, Donepezil 5mg  qd for memory. She listens to audio books due to her low vision.  Past Medical History:  Diagnosis Date    . Alzheimer's disease ([place]) 01/10/2015   MMSE 25/30  . Anxiety    mild  . Anxiety 01/11/2014  . Aortic stenosis    a. Echo 03/2010:  EF 70-75%, mild AS  . Cerebral atrophy ([place]) 01/10/2015  . Cerebrovascular disease 11/01/2005   Small vessel disease  . Chronic neck pain 08/06/2011  . Closed pelvic fracture ([place]) 09/29/12   left nondisplaced inferior pelvic ramus  . Glaucoma    "both eyes; no vision in left eye as a result" (04/06/2013)  . History of recurrent UTIs   . Hypertension   . Osteoarthritis 08/06/2011  . Pneumonia 04/06/2013   "for the first time" (04/06/2013)  . PVC (premature ventricular contraction)   . Senile osteoporosis 04/08/2011  . SVT (supraventricular tachycardia) ([place])   . Thumb pain 08/06/2011  . Vision loss of left eye    "can't see at all out of it" (04/06/2013)   Past Surgical History:  Procedure Laterality Date  . APPENDECTOMY    . CATARACT EXTRACTION, BILATERAL Bilateral   . GLAUCOMA SURGERY Left    "put in stent for glaucoma" (04/06/2013)  . TONSILLECTOMY      Allergies  Allergen Reactions  . Codeine Nausea Only  . Sulfate     Outpatient Encounter Medications as of 08/09/2018  Medication Sig  . acetaminophen (TYLENOL) 325 MG tablet Take 650 mg by mouth every 4 (four) hours as needed.   Marland Kitchen atorvastatin (LIPITOR) 10 MG tablet Take 10 mg by mouth daily.  . Calcium Citrate-Vitamin D 315-250 MG-UNIT TABS Take 1 tablet by mouth 2 (two) times daily.   . cholecalciferol (VITAMIN D)  1000 units tablet Take 1,000 Units by mouth daily.  Marland Kitchen donepezil (ARICEPT) 5 MG tablet Take 5 mg by mouth at bedtime.  . feeding supplement (BOOST / RESOURCE BREEZE) LIQD Take 1 Container by mouth 2 (two) times daily.  . magnesium hydroxide (MILK OF MAGNESIA) 400 MG/5ML suspension Take 30 mLs by mouth at bedtime.  . memantine (NAMENDA) 10 MG tablet Take 10 mg by mouth 2 (two) times daily.  Vladimir Faster Glycol-Propyl Glycol (SYSTANE) 0.4-0.3 % GEL ophthalmic gel Place 1 application into  both eyes 4 (four) times daily. For dryness   . sodium chloride (MURO 128) 5 % ophthalmic ointment Place 1 application into the left eye at bedtime.   No facility-administered encounter medications on file as of 08/09/2018.    ROS was provided with assistance of staff Review of Systems  Constitutional: Negative for activity change, appetite change, chills, diaphoresis, fatigue, fever and unexpected weight change.  HENT: Positive for hearing loss. Negative for congestion and voice change.   Eyes: Positive for visual disturbance.       Low vision left eye.   Respiratory: Negative for cough, shortness of breath and wheezing.   Cardiovascular: Negative for chest pain, palpitations and leg swelling.  Gastrointestinal: Negative for abdominal distention, abdominal pain, constipation, diarrhea and nausea.  Genitourinary: Negative for difficulty urinating, dysuria and urgency.  Musculoskeletal: Positive for arthralgias and gait problem.  Skin: Negative for color change and pallor.  Neurological: Negative for dizziness, speech difficulty, weakness and headaches.       Dementia  Psychiatric/Behavioral: Negative for agitation, behavioral problems, hallucinations and sleep disturbance. The patient is not nervous/anxious.     Immunization History  Administered Date(s) Administered  . Influenza Split 09/22/2011, 09/13/2012  . Influenza-Unspecified 08/09/2013, 08/27/2014, 08/08/2015, 08/25/2016, 08/31/2017  . Pneumococcal Conjugate-13 08/19/2017  . Tdap 09/13/2012   Pertinent  Health Maintenance Due  Topic Date Due  . HEMOGLOBIN A1C  01-18-19  . FOOT EXAM  03/03/1927  . OPHTHALMOLOGY EXAM  [birth year]03[birth year]29  . URINE MICROALBUMIN  03/03/1929  . INFLUENZA VACCINE  06/09/2018  . PNA vac Low Risk Adult (2 of 2 - PPSV23) 08/19/2018  . DEXA SCAN  Completed   Fall Risk  07/05/2018 06/29/2017 03/21/2015 08/23/2014 09/29/2012  Falls in the past year? No - No No No  Number falls in past yr: - 1 - - -  Injury  with Fall? - No - - -   Functional Status Survey:    Vitals:   08/09/18 1216  BP: 116/62  Pulse: 73  Resp: 20  Temp: 98.5 F (36.9 C)  Weight: 112 lb 12.8 oz (51.2 kg)  Height: 5\' 4"  (1.626 m)   Body mass index is 19.36 kg/m. Physical Exam  Constitutional: She appears well-developed and well-nourished.  HENT:  Head: Normocephalic and atraumatic.  Eyes: Pupils are equal, round, and reactive to light. EOM are normal.  Neck: Normal range of motion. Neck supple. No JVD present. No thyromegaly present.  Cardiovascular: Normal rate and regular rhythm.  Murmur heard. Pulmonary/Chest: Effort normal. She has no wheezes. She has no rales.  Abdominal: Soft. She exhibits no distension. There is no tenderness. There is no rebound and no guarding.  Musculoskeletal: She exhibits no edema.  Self transfers. Ambulates with walker. Kyphoscoliosis.   Neurological: She is alert. No cranial nerve deficit. She exhibits normal muscle tone. Coordination normal.  Oriented to person and place.   Skin: Skin is warm and dry.  Psychiatric: She has a normal mood and affect. Her  behavior is normal.    Labs reviewed: Recent Labs    09/14/17 11/11/17 04/14/18  NA 140 139 142  K 4.5 4.5 4.9  CL  --   --  109  CO2  --   --  29  BUN 29* 33* 33*  CREATININE 1.1 0.7 1.3*  CALCIUM  --   --  8.6   Recent Labs    11/11/17 12/30/17 04/14/18  AST 17 19 16   ALT 10 16 13   ALKPHOS 63 63 53  ALBUMIN  --   --  3.5   Recent Labs    09/14/17 11/11/17 04/14/18  WBC 3.4 5.7 3.8  HGB 11.8* 12.6 11.4*  HCT 34* 37 34*  PLT 169 320 179   Lab Results  Component Value Date   TSH 2.34 09/14/2017   No results found for: HGBA1C Lab Results  Component Value Date   CHOL 174 12/30/2017   HDL 89 (A) 12/30/2017   LDLCALC 75 12/30/2017   TRIG 34 (A) 12/30/2017    Significant Diagnostic Results in last 30 days:  No results found.  Assessment/Plan Mixed Alzheimer's and vascular dementia Stable, continue SNF  [facility] for safety and care assistance. Continue Memantine 10mg  bid, Donepezil 5mg  qd for memory.   Chronic constipation Stable, continue MOM 25ml qhs po.   Unsteady gait Continue to ambulates with walker, supervision needed.     Family/ staff Communication: plan of care reviewed with the patient and charge nurse.   Labs/tests ordered:  none  Time spend 25 minutes.

## 2018-08-09 NOTE — Assessment & Plan Note (Signed)
Continue to ambulates with walker, supervision needed.

## 2018-09-15 ENCOUNTER — Non-Acute Institutional Stay (SKILLED_NURSING_FACILITY): Payer: Medicare Other | Admitting: Family Medicine

## 2018-09-15 ENCOUNTER — Encounter: Payer: Self-pay | Admitting: Family Medicine

## 2018-09-15 DIAGNOSIS — I350 Nonrheumatic aortic (valve) stenosis: Secondary | ICD-10-CM

## 2018-09-15 DIAGNOSIS — I10 Essential (primary) hypertension: Secondary | ICD-10-CM | POA: Diagnosis not present

## 2018-09-15 DIAGNOSIS — F0280 Dementia in other diseases classified elsewhere without behavioral disturbance: Secondary | ICD-10-CM

## 2018-09-15 DIAGNOSIS — G309 Alzheimer's disease, unspecified: Secondary | ICD-10-CM

## 2018-09-15 DIAGNOSIS — N183 Chronic kidney disease, stage 3 unspecified: Secondary | ICD-10-CM

## 2018-09-15 DIAGNOSIS — F0150 Vascular dementia without behavioral disturbance: Secondary | ICD-10-CM

## 2018-09-15 NOTE — Progress Notes (Signed)
Provider:  Alain Honey, MD Location:  [facility] Room Number: 66 Place of Service:  SNF (31)  PCP: Mast, Man X, NP Patient Care Team: Mast, Man X, NP as PCP - General (Internal Medicine) [place] Minus Breeding, MD as Consulting Physician (Cardiology) Nathanial Rancher, MD as Consulting Physician (Ophthalmology) Leta Baptist, MD as Consulting Physician (Otolaryngology) Mast, Man X, NP as Nurse Practitioner (Internal Medicine)  Extended Emergency Contact Information Primary Emergency Contact: McGreggor,Wynn  Johnnette Litter of [place] Phone: [phone] Mobile Phone: [phone] Relation: Friend Secondary Emergency Contact: Daniel,Gloria Address: [place], [place] Phone: [phone] Mobile Phone: [phone] Relation: Friend  Code Status:  DNR Goals of Care: Advanced Directive information Advanced Directives 09/15/2018  Does Patient Have a Medical Advance Directive? Yes  Type of Paramedic of [place];Living will;Out of facility DNR (pink MOST or yellow form)  Does patient want to make changes to medical advance directive? No - Patient declined  Copy of [place] in Chart? Yes - validated most recent copy scanned in chart (See row information)  Pre-existing out of facility DNR order (yellow form or pink MOST form) Yellow form placed in chart (order not valid for inpatient use)      Chief Complaint  Patient presents with  . Medical Management of Chronic Issues    F/u-Alzheimers, dementia, constipation, HTN, GERD,    HPI: Patient is a 84 y.o. female seen today for medical management of chronic problems including: Alzheimer's dementia, mixed hyperlipidemia, hypertension, and chronic kidney disease.  Patient had MMSE 3 years ago and scored 25 out of 30.  In we spent some time today discussing her memory.  She seems genuinely concerned  that she is losing some of her memory I think that is probably true.  I just saw her after she is come back from her appointment and she did not recall that but is otherwise able to carry on normal conversation.  She recalls family members who live nearby.  She does not have any specific complaints today.  Nursing likewise has no complaints.  Past Medical History:  Diagnosis Date  . Alzheimer's disease ([place]) 01/10/2015   MMSE 25/30  . Anxiety    mild  . Anxiety 01/11/2014  . Aortic stenosis    a. Echo 03/2010:  EF 70-75%, mild AS  . Cerebral atrophy ([place]) 01/10/2015  . Cerebrovascular disease 11/01/2005   Small vessel disease  . Chronic neck pain 08/06/2011  . Closed pelvic fracture ([place]) 09/29/12   left nondisplaced inferior pelvic ramus  . Glaucoma    "both eyes; no vision in left eye as a result" (04/06/2013)  . History of recurrent UTIs   . Hypertension   . Osteoarthritis 08/06/2011  . Pneumonia 04/06/2013   "for the first time" (04/06/2013)  . PVC (premature ventricular contraction)   . Senile osteoporosis 04/08/2011  . SVT (supraventricular tachycardia) ([place])   . Thumb pain 08/06/2011  . Vision loss of left eye    "can't see at all out of it" (04/06/2013)   Past Surgical History:  Procedure Laterality Date  . APPENDECTOMY    . CATARACT EXTRACTION, BILATERAL Bilateral   . GLAUCOMA SURGERY Left    "put in stent for glaucoma" (04/06/2013)  . TONSILLECTOMY      reports that she quit smoking about 34 years ago. Her smoking use included cigarettes. She has a  15.00 pack-year smoking history. She has never used smokeless tobacco. She reports that she drinks about 7.0 standard drinks of alcohol per week. She reports that she does not use drugs. Social History   Socioeconomic History  . Marital status: Widowed    Spouse name: Not on file  . Number of children: Not on file  . Years of education: Not on file  . Highest education level: Not on file  Occupational History  . Occupation:  retired Tour manager  . Financial resource strain: Not hard at all  . Food insecurity:    Worry: Never true    Inability: Never true  . Transportation needs:    Medical: No    Non-medical: No  Tobacco Use  . Smoking status: Former Smoker    Packs/day: 0.50    Years: 30.00    Pack years: 15.00    Types: Cigarettes    Last attempt to quit: 01/14/1984    Years since quitting: 34.6  . Smokeless tobacco: Never Used  Substance and Sexual Activity  . Alcohol use: Yes    Alcohol/week: 7.0 standard drinks    Types: 7 Glasses of wine per week    Comment: 4 oz every evening as request  . Drug use: No  . Sexual activity: Never  Lifestyle  . Physical activity:    Days per week: 0 days    Minutes per session: 0 min  . Stress: Only a little  Relationships  . Social connections:    Talks on phone: Three times a week    Gets together: Three times a week    Attends religious service: Never    Active member of club or organization: No    Attends meetings of clubs or organizations: Never    Relationship status: Widowed  . Intimate partner violence:    Fear of current or ex partner: No    Emotionally abused: No    Physically abused: No    Forced sexual activity: No  Other Topics Concern  . Not on file  Social History Narrative   [facility] at [facility],  [place] 08/08/[place]   Widowed   Previously smoked 1/2 PPD, smoked 30-40 years stopped [place] with walker   Exercise with machines, and walk   Alcohol occasionally    DNR, POA   Body has been bequeathed to [facility].              Functional Status Survey:    No family history on file.  Health Maintenance  Topic Date Due  . HEMOGLOBIN A1C  22-Jan-1917  . FOOT EXAM  03/03/1927  . OPHTHALMOLOGY EXAM  03/03/1927  . URINE MICROALBUMIN  03/03/1927  . PNA vac Low Risk Adult (2 of 2 - PPSV23) 08/19/2018  . TETANUS/TDAP  09/13/2022  . INFLUENZA VACCINE  Completed  . DEXA SCAN   Completed    Allergies  Allergen Reactions  . Codeine Nausea Only  . Sulfate     Outpatient Encounter Medications as of 09/15/2018  Medication Sig  . acetaminophen (TYLENOL) 325 MG tablet Take 650 mg by mouth every 4 (four) hours as needed.   Marland Kitchen atorvastatin (LIPITOR) 10 MG tablet Take 10 mg by mouth daily.  . Calcium Citrate-Vitamin D 315-250 MG-UNIT TABS Take 1 tablet by mouth 2 (two) times daily.   . cholecalciferol (VITAMIN D) 1000 units tablet Take 1,000 Units by mouth daily.  Marland Kitchen donepezil (ARICEPT) 5 MG  tablet Take 5 mg by mouth at bedtime.  . feeding supplement (BOOST / RESOURCE BREEZE) LIQD Take 1 Container by mouth 2 (two) times daily.  . magnesium hydroxide (MILK OF MAGNESIA) 400 MG/5ML suspension Take 30 mLs by mouth at bedtime.  . memantine (NAMENDA) 10 MG tablet Take 10 mg by mouth 2 (two) times daily.  Vladimir Faster Glycol-Propyl Glycol (SYSTANE) 0.4-0.3 % GEL ophthalmic gel Place 1 application into both eyes 4 (four) times daily. For dryness   . sodium chloride (MURO 128) 5 % ophthalmic ointment Place 1 application into the left eye at bedtime.   No facility-administered encounter medications on file as of 09/15/2018.     Review of Systems  Constitutional: Negative.   HENT: Negative.   Eyes: Positive for visual disturbance.  Respiratory: Negative.   Cardiovascular: Negative.   Gastrointestinal: Negative.   Genitourinary: Negative.   Musculoskeletal: Positive for gait problem.  Skin: Negative.   Psychiatric/Behavioral: Positive for confusion.    Vitals:   09/15/18 1106  BP: (!) 111/55  Pulse: 70  Resp: 20  Temp: 97.8 F (36.6 C)  SpO2: 95%  Weight: 112 lb (50.8 kg)  Height: 5\' 4"  (1.626 m)   Body mass index is 19.22 kg/m. Physical Exam  Constitutional: She appears well-developed and well-nourished.  HENT:  Head: Normocephalic.  Mouth/Throat: Oropharynx is clear and moist.  Eyes:  Pupillary change on the left.  Blind in that eye  Neck: Normal range of  motion.  Cardiovascular: Normal rate and regular rhythm.  Murmur heard. Pulmonary/Chest: Effort normal and breath sounds normal.  Abdominal: Soft. Bowel sounds are normal.  Neurological: She is alert.  Oriented to person and place  Skin: Skin is warm and dry.  Psychiatric: She has a normal mood and affect. Her behavior is normal.  Nursing note and vitals reviewed.   Labs reviewed: Basic Metabolic Panel: Recent Labs    11/11/17 04/14/18  NA 139 142  K 4.5 4.9  CL  --  109  CO2  --  29  BUN 33* 33*  CREATININE 0.7 1.3*  CALCIUM  --  8.6   Liver Function Tests: Recent Labs    11/11/17 12/30/17 04/14/18  AST 17 19 16   ALT 10 16 13   ALKPHOS 63 63 53  ALBUMIN  --   --  3.5   No results for input(s): LIPASE, AMYLASE in the last 8760 hours. No results for input(s): AMMONIA in the last 8760 hours. CBC: Recent Labs    11/11/17 04/14/18  WBC 5.7 3.8  HGB 12.6 11.4*  HCT 37 34*  PLT 320 179   Cardiac Enzymes: No results for input(s): CKTOTAL, CKMB, CKMBINDEX, TROPONINI in the last 8760 hours. BNP: Invalid input(s): POCBNP No results found for: HGBA1C Lab Results  Component Value Date   TSH 2.34 09/14/2017   No results found for: VITAMINB12 No results found for: FOLATE No results found for: IRON, TIBC, FERRITIN  Imaging and Procedures obtained prior to SNF admission: Dg Shoulder Right  Addendum Date: 12/20/2015   ADDENDUM REPORT: 12/19/2015 23:59 ADDENDUM: After further clinical information concern for clavicular tenderness, there is a midshaft clavicle deformity consistent with acute fracture. I discussed these findings with the EDP. Electronically Signed   By: Staci Righter M.D.   On: 12/19/2015 23:59  Result Date: 12/19/2015 CLINICAL DATA:  [place], pain. EXAM: RIGHT SHOULDER - 2+ VIEW COMPARISON:  None. FINDINGS: There is no evidence of fracture or dislocation. There is no evidence of arthropathy or other focal  bone abnormality. Soft tissues are unremarkable.  IMPRESSION: Negative. Electronically Signed: By: Staci Righter M.D. On: 12/19/2015 23:02    Assessment/Plan 1. Essential hypertension Patient is on no medicine and blood pressure today is 111/55  2. Aortic valve stenosis, etiology of cardiac valve disease unspecified No recent chest pain or syncope.  Basically asymptomatic   3. Mixed Alzheimer's and vascular dementia [facility]) Patient is conversant and oriented x2.  Medications include Aricept and Namenda  4. CKD (chronic kidney disease) stage 3, GFR 30-59 ml/min (HCC) 's recent creatinine 1.3.  Appears stable   Family/ staff Communication: And is communicated to nursing staff  Labs/tests ordered: none  Lillette Boxer. Sabra Heck, [place] [address] [place], [place] Office [phone]

## 2018-09-28 ENCOUNTER — Encounter: Payer: Self-pay | Admitting: Nurse Practitioner

## 2018-09-28 ENCOUNTER — Non-Acute Institutional Stay (SKILLED_NURSING_FACILITY): Payer: Medicare Other | Admitting: Nurse Practitioner

## 2018-09-28 DIAGNOSIS — N318 Other neuromuscular dysfunction of bladder: Secondary | ICD-10-CM

## 2018-09-28 DIAGNOSIS — G309 Alzheimer's disease, unspecified: Secondary | ICD-10-CM

## 2018-09-28 DIAGNOSIS — N3942 Incontinence without sensory awareness: Secondary | ICD-10-CM

## 2018-09-28 DIAGNOSIS — R410 Disorientation, unspecified: Secondary | ICD-10-CM | POA: Diagnosis not present

## 2018-09-28 DIAGNOSIS — F0280 Dementia in other diseases classified elsewhere without behavioral disturbance: Secondary | ICD-10-CM

## 2018-09-28 DIAGNOSIS — R4182 Altered mental status, unspecified: Secondary | ICD-10-CM

## 2018-09-28 DIAGNOSIS — F0150 Vascular dementia without behavioral disturbance: Secondary | ICD-10-CM

## 2018-09-28 HISTORY — DX: Altered mental status, unspecified: R41.82

## 2018-09-28 NOTE — Assessment & Plan Note (Signed)
Worsened, usually she uses adult depends for urinary leakage, now is fully incontinent of urine. She admitted she urinate more often than usual. She denied abd or CVA tenderness, dysuria. She is afebrile. Will obtain UA C/S to r/o UTI.

## 2018-09-28 NOTE — Assessment & Plan Note (Signed)
Increased confusion in setting of baseline dementia, will update CBC/diff, CMP to evaluate further.

## 2018-09-28 NOTE — Progress Notes (Signed)
Location:  [place] Room Number: 75 Place of Service:  SNF (31) Provider:  Marlana Latus  NP  Ayaka Andes X, NP  Patient Care Team: Skyra Crichlow X, NP as PCP - General (Internal Medicine) [place] Minus Breeding, MD as Consulting Physician (Cardiology) Nathanial Rancher, MD as Consulting Physician (Ophthalmology) Leta Baptist, MD as Consulting Physician (Otolaryngology) Jah Alarid X, NP as Nurse Practitioner (Internal Medicine)  Extended Emergency Contact Information Primary Emergency Contact: McGreggor,Wynn  Johnnette Litter of [place] Phone: [phone] Mobile Phone: [phone] Relation: Friend Secondary Emergency Contact: Daniel,Gloria Address: [place], [place] of [place] Phone: [phone] Mobile Phone: [phone] Relation: Friend  Code Status:  DNR Goals of care: Advanced Directive information Advanced Directives 09/15/2018  Does Patient Have a Medical Advance Directive? Yes  Type of Paramedic of [place];Living will;Out of facility DNR (pink MOST or yellow form)  Does patient want to make changes to medical advance directive? No - Patient declined  Copy of [place] in Chart? Yes - validated most recent copy scanned in chart (See row information)  Pre-existing out of facility DNR order (yellow form or pink MOST form) Yellow form placed in chart (order not valid for inpatient use)     Chief Complaint  Patient presents with  . Acute Visit    C/o- ? UTI    HPI:  Pt is a 84 y.o. female seen today for an acute visit for worsened urinary incontinence and urinary frequency, she is reported having mental status change, restless last night, more confused than usual today. She believes that she was speaking to someone on the phone and they are coming to get her, up early waiting for "my mom and dad are looking for me, they muse be worried sick" "I  was in [place] yesterday". HPI was provided with assistance of staff, the patient resides in SNF [facility] for safety and care assistance, on Memantine 10mg  bid, Donepezil 5mg  qd for memory.    Past Medical History:  Diagnosis Date  . Alzheimer's disease ([place]) 01/10/2015   MMSE 25/30  . Anxiety    mild  . Anxiety 01/11/2014  . Aortic stenosis    a. Echo 03/2010:  EF 70-75%, mild AS  . Cerebral atrophy ([place]) 01/10/2015  . Cerebrovascular disease 11/01/2005   Small vessel disease  . Chronic neck pain 08/06/2011  . Closed pelvic fracture ([place]) 09/29/12   left nondisplaced inferior pelvic ramus  . Glaucoma    "both eyes; no vision in left eye as a result" (04/06/2013)  . History of recurrent UTIs   . Hypertension   . Osteoarthritis 08/06/2011  . Pneumonia 04/06/2013   "for the first time" (04/06/2013)  . PVC (premature ventricular contraction)   . Senile osteoporosis 04/08/2011  . SVT (supraventricular tachycardia) ([place])   . Thumb pain 08/06/2011  . Vision loss of left eye    "can't see at all out of it" (04/06/2013)   Past Surgical History:  Procedure Laterality Date  . APPENDECTOMY    . CATARACT EXTRACTION, BILATERAL Bilateral   . GLAUCOMA SURGERY Left    "put in stent for glaucoma" (04/06/2013)  . TONSILLECTOMY      Allergies  Allergen Reactions  . Codeine Nausea Only  . Sulfate     Outpatient Encounter Medications as of 09/28/2018  Medication Sig  . acetaminophen (TYLENOL) 325 MG tablet Take 650 mg  by mouth every 4 (four) hours as needed.   Marland Kitchen atorvastatin (LIPITOR) 10 MG tablet Take 10 mg by mouth daily.  . Calcium Citrate-Vitamin D 315-250 MG-UNIT TABS Take 1 tablet by mouth 2 (two) times daily.   . cholecalciferol (VITAMIN D) 1000 units tablet Take 1,000 Units by mouth daily.  Marland Kitchen donepezil (ARICEPT) 5 MG tablet Take 5 mg by mouth at bedtime.  . magnesium hydroxide (MILK OF MAGNESIA) 400 MG/5ML suspension Take 30 mLs by mouth at bedtime.  . memantine (NAMENDA) 10 MG tablet Take 10 mg  by mouth 2 (two) times daily.  Vladimir Faster Glycol-Propyl Glycol (SYSTANE) 0.4-0.3 % GEL ophthalmic gel Place 1 application into both eyes 4 (four) times daily. For dryness   . sodium chloride (MURO 128) 5 % ophthalmic ointment Place 1 application into the left eye at bedtime.  . [DISCONTINUED] feeding supplement (BOOST / RESOURCE BREEZE) LIQD Take 1 Container by mouth 2 (two) times daily.   No facility-administered encounter medications on file as of 09/28/2018.    ROS was provided with assistance of staff Review of Systems  Constitutional: Negative for activity change, appetite change, chills, diaphoresis, fatigue and fever.  HENT: Positive for hearing loss. Negative for congestion and voice change.   Eyes: Positive for visual disturbance.       Left eye low vision  Respiratory: Negative for cough and shortness of breath.   Cardiovascular: Negative for chest pain, palpitations and leg swelling.  Gastrointestinal: Negative for abdominal distention, abdominal pain, constipation, diarrhea, nausea and vomiting.  Genitourinary: Positive for frequency. Negative for difficulty urinating, dysuria and urgency.       Worsened incontinent of urine  Musculoskeletal: Positive for arthralgias and gait problem.  Skin: Negative for color change and pallor.  Neurological: Negative for dizziness, speech difficulty, weakness and headaches.       Dementia  Psychiatric/Behavioral: Positive for confusion and sleep disturbance. Negative for agitation, behavioral problems and hallucinations. The patient is not nervous/anxious.     Immunization History  Administered Date(s) Administered  . Influenza Split 09/22/2011, 09/13/2012  . Influenza Whole 08/11/2018  . Influenza-Unspecified 08/09/2013, 08/27/2014, 08/08/2015, 08/25/2016, 08/31/2017  . Pneumococcal Conjugate-13 08/19/2017  . Tdap 09/13/2012   Pertinent  Health Maintenance Due  Topic Date Due  . HEMOGLOBIN A1C  Jun 06, 1917  . FOOT EXAM  [birth year]1929    . OPHTHALMOLOGY EXAM  [birth year]/03/1929  . URINE MICROALBUMIN  03/03/1929  . PNA vac Low Risk Adult (2 of 2 - PPSV23) 08/19/2018  . INFLUENZA VACCINE  Completed  . DEXA SCAN  Completed   Fall Risk  07/05/2018 06/29/2017 03/21/2015 08/23/2014 09/29/2012  Falls in the past year? No - No No No  Number falls in past yr: - 1 - - -  Injury with Fall? - No - - -   Functional Status Survey:    Vitals:   09/28/18 1503  BP: 130/70  Pulse: 72  Resp: 20  Temp: 97.8 F (36.6 C)  SpO2: 95%  Weight: 112 lb (50.8 kg)  Height: 5\' 4"  (1.626 m)   Body mass index is 19.22 kg/m. Physical Exam  Constitutional: She appears well-developed and well-nourished.  HENT:  Head: Normocephalic and atraumatic.  Eyes: Conjunctivae are normal.  Low vision left eye  Neck: Normal range of motion. Neck supple. No JVD present. No thyromegaly present.  Cardiovascular: Normal rate and regular rhythm.  Murmur heard. Pulmonary/Chest: Effort normal. She has no wheezes. She has no rales.  Abdominal: Soft. She exhibits no distension. There is  no tenderness. There is no rebound and no guarding.  Musculoskeletal: She exhibits no edema.  Kyphoscoliosis. Ambulates with walker.   Neurological: She is alert. No cranial nerve deficit. She exhibits normal muscle tone. Coordination normal.  Oriented to self.   Skin: Skin is warm and dry.  Psychiatric: She has a normal mood and affect.  More confused than usual.     Labs reviewed: Recent Labs    11/11/17 04/14/18  NA 139 142  K 4.5 4.9  CL  --  109  CO2  --  29  BUN 33* 33*  CREATININE 0.7 1.3*  CALCIUM  --  8.6   Recent Labs    11/11/17 12/30/17 04/14/18  AST 17 19 16   ALT 10 16 13   ALKPHOS 63 63 53  ALBUMIN  --   --  3.5   Recent Labs    11/11/17 04/14/18  WBC 5.7 3.8  HGB 12.6 11.4*  HCT 37 34*  PLT 320 179   Lab Results  Component Value Date   TSH 2.34 09/14/2017   No results found for: HGBA1C Lab Results  Component Value Date   CHOL 174  12/30/2017   HDL 89 (A) 12/30/2017   LDLCALC 75 12/30/2017   TRIG 34 (A) 12/30/2017    Significant Diagnostic Results in last 30 days:  No results found.  Assessment/Plan Urine incontinence Worsened, usually she uses adult depends for urinary leakage, now is fully incontinent of urine. She admitted she urinate more often than usual. She denied abd or CVA tenderness, dysuria. She is afebrile. Will obtain UA C/S to r/o UTI.   Hypertonicity of bladder Increased urinary frequency, UA C/S to r/o UTI.   Altered mental status Increased confusion in setting of baseline dementia, will update CBC/diff, CMP to evaluate further.   Mixed Alzheimer's and vascular dementia Continue SNF [facility] for safety and care assistance, continue Memantine 10mg  bid, Donepezil 5mg  qd for memory.      Family/ staff Communication: plan of care reviewed with the patient and charge nurse.   Labs/tests ordered:  CBC/diff, CMP, UA C/S  Time spend 25 minutes.

## 2018-09-28 NOTE — Assessment & Plan Note (Signed)
Continue SNF [facility] for safety and care assistance, continue Memantine 10mg  bid, Donepezil 5mg  qd for memory.

## 2018-09-28 NOTE — Assessment & Plan Note (Signed)
Increased urinary frequency, UA C/S to r/o UTI.

## 2018-10-04 ENCOUNTER — Encounter: Payer: Self-pay | Admitting: Nurse Practitioner

## 2018-10-04 DIAGNOSIS — F03918 Unspecified dementia, unspecified severity, with other behavioral disturbance: Secondary | ICD-10-CM | POA: Insufficient documentation

## 2018-10-04 DIAGNOSIS — F0391 Unspecified dementia with behavioral disturbance: Secondary | ICD-10-CM | POA: Insufficient documentation

## 2018-10-05 LAB — BASIC METABOLIC PANEL
BUN: 30 — AB (ref 4–21)
CREATININE: 1.4 — AB (ref <=1.1)
GLUCOSE: 89
POTASSIUM: 4.4 (ref 3.4–5.3)
Sodium: 143 (ref 137–147)

## 2018-10-10 ENCOUNTER — Other Ambulatory Visit: Payer: Self-pay | Admitting: *Deleted

## 2018-10-10 LAB — BASIC METABOLIC PANEL
CHLORIDE: 106
CO2: 29
Calcium: 8.7
EGFR (Non-African Amer.): 31

## 2018-10-12 ENCOUNTER — Non-Acute Institutional Stay (SKILLED_NURSING_FACILITY): Payer: Medicare Other | Admitting: Nurse Practitioner

## 2018-10-12 ENCOUNTER — Encounter: Payer: Self-pay | Admitting: Nurse Practitioner

## 2018-10-12 DIAGNOSIS — G309 Alzheimer's disease, unspecified: Secondary | ICD-10-CM

## 2018-10-12 DIAGNOSIS — R634 Abnormal weight loss: Secondary | ICD-10-CM

## 2018-10-12 DIAGNOSIS — K5909 Other constipation: Secondary | ICD-10-CM | POA: Diagnosis not present

## 2018-10-12 DIAGNOSIS — F329 Major depressive disorder, single episode, unspecified: Secondary | ICD-10-CM

## 2018-10-12 DIAGNOSIS — F0393 Unspecified dementia, unspecified severity, with mood disturbance: Secondary | ICD-10-CM

## 2018-10-12 DIAGNOSIS — F0150 Vascular dementia without behavioral disturbance: Secondary | ICD-10-CM

## 2018-10-12 DIAGNOSIS — F0280 Dementia in other diseases classified elsewhere without behavioral disturbance: Secondary | ICD-10-CM

## 2018-10-12 NOTE — Assessment & Plan Note (Signed)
Will increase Sertraline 25mg  po qd. Observe.

## 2018-10-12 NOTE — Progress Notes (Signed)
Location:  [place] Room Number: 45 Place of Service:  SNF (31) Provider:  Marlana Latus  NP  Jaquita Bessire X, NP  Patient Care Team: Leinaala Catanese X, NP as PCP - General (Internal Medicine) [place] Minus Breeding, MD as Consulting Physician (Cardiology) Nathanial Rancher, MD as Consulting Physician (Ophthalmology) Leta Baptist, MD as Consulting Physician (Otolaryngology) Draya Felker X, NP as Nurse Practitioner (Internal Medicine)  Extended Emergency Contact Information Primary Emergency Contact: McGreggor,Wynn  Johnnette Litter of [place] Phone: [phone] Mobile Phone: [phone] Relation: Friend Secondary Emergency Contact: Daniel,Gloria Address: [place], [place] of [place] Phone: [phone] Mobile Phone: [phone] Relation: Friend  Code Status:  DNR Goals of care: Advanced Directive information Advanced Directives 10/12/2018  Does Patient Have a Medical Advance Directive? Yes  Type of Paramedic of [place];Living will;Out of facility DNR (pink MOST or yellow form)  Does patient want to make changes to medical advance directive? No - Patient declined  Copy of [place] in Chart? Yes - validated most recent copy scanned in chart (See row information)  Pre-existing out of facility DNR order (yellow form or pink MOST form) Yellow form placed in chart (order not valid for inpatient use)     Chief Complaint  Patient presents with  . Medical Management of Chronic Issues    F/u- HTN, CKD, alzheimers, constipation, unsteady gait,     HPI:  Pt is a 84 y.o. female seen today for medical management of chronic diseases.     The patient has history of dementia, resides in SNF [facility] for safety and care assistance, on Memantine 10mg  bid, Doenepzil 5mg  qd for memory. Her mood is not well controlled on Sertraline 12.5mg  qd, exhibited pacing, walking down the  fall+looking for family, packing, angry, fearful sometimes. No constipation while on MOM qd   Past Medical History:  Diagnosis Date  . Alzheimer's disease ([place]) 01/10/2015   MMSE 25/30  . Anxiety    mild  . Anxiety 01/11/2014  . Aortic stenosis    a. Echo 03/2010:  EF 70-75%, mild AS  . Cerebral atrophy ([place]) 01/10/2015  . Cerebrovascular disease 11/01/2005   Small vessel disease  . Chronic neck pain 08/06/2011  . Closed pelvic fracture ([place]) 09/29/12   left nondisplaced inferior pelvic ramus  . Glaucoma    "both eyes; no vision in left eye as a result" (04/06/2013)  . History of recurrent UTIs   . Hypertension   . Osteoarthritis 08/06/2011  . Pneumonia 04/06/2013   "for the first time" (04/06/2013)  . PVC (premature ventricular contraction)   . Senile osteoporosis 04/08/2011  . SVT (supraventricular tachycardia) ([place])   . Thumb pain 08/06/2011  . Vision loss of left eye    "can't see at all out of it" (04/06/2013)   Past Surgical History:  Procedure Laterality Date  . APPENDECTOMY    . CATARACT EXTRACTION, BILATERAL Bilateral   . GLAUCOMA SURGERY Left    "put in stent for glaucoma" (04/06/2013)  . TONSILLECTOMY      Allergies  Allergen Reactions  . Codeine Nausea Only  . Sulfate     Outpatient Encounter Medications as of 10/12/2018  Medication Sig  . acetaminophen (TYLENOL) 325 MG tablet Take 650 mg by mouth every 4 (four) hours as needed.   Marland Kitchen atorvastatin (LIPITOR) 10 MG tablet Take 10 mg by mouth daily.  Marland Kitchen  Calcium Citrate-Vitamin D 315-250 MG-UNIT TABS Take 1 tablet by mouth 2 (two) times daily.   . cholecalciferol (VITAMIN D) 1000 units tablet Take 1,000 Units by mouth daily.  Marland Kitchen donepezil (ARICEPT) 5 MG tablet Take 5 mg by mouth at bedtime.  . feeding supplement (BOOST / RESOURCE BREEZE) LIQD Take 125 mLs by mouth 2 (two) times daily between meals.  . magnesium hydroxide (MILK OF MAGNESIA) 400 MG/5ML suspension Take 30 mLs by mouth at bedtime.  . memantine (NAMENDA) 10 MG  tablet Take 10 mg by mouth 2 (two) times daily.  Vladimir Faster Glycol-Propyl Glycol (SYSTANE) 0.4-0.3 % GEL ophthalmic gel Place 1 application into both eyes 4 (four) times daily. For dryness   . sertraline (ZOLOFT) 25 MG tablet Take 12.5 mg by mouth daily.  . sodium chloride (MURO 128) 5 % ophthalmic ointment Place 1 application into the left eye at bedtime.   No facility-administered encounter medications on file as of 10/12/2018.    ROS was provided with assistance of staff Review of Systems  Constitutional: Positive for unexpected weight change. Negative for activity change, appetite change, chills, diaphoresis, fatigue and fever.       #4Ind weight loss in the past month.   HENT: Positive for hearing loss. Negative for congestion and voice change.   Eyes: Positive for visual disturbance.       Left eye low vision.   Respiratory: Negative for cough, shortness of breath and wheezing.   Cardiovascular: Negative for chest pain, palpitations and leg swelling.  Gastrointestinal: Negative for abdominal distention, abdominal pain, constipation, diarrhea, nausea and vomiting.  Genitourinary: Negative for difficulty urinating, dysuria and urgency.  Musculoskeletal: Positive for arthralgias and gait problem.  Skin: Negative for color change and pallor.  Neurological: Negative for dizziness, speech difficulty and headaches.       Dementia  Psychiatric/Behavioral: Positive for behavioral problems, confusion and sleep disturbance. Negative for agitation. The patient is nervous/anxious.     Immunization History  Administered Date(s) Administered  . Influenza Split 09/22/2011, 09/13/2012  . Influenza Whole 08/11/2018  . Influenza-Unspecified 08/09/2013, 08/27/2014, 08/08/2015, 08/25/2016, 08/31/2017  . Pneumococcal Conjugate-13 08/19/2017  . Tdap 09/13/2012   Pertinent  Health Maintenance Due  Topic Date Due  . HEMOGLOBIN A1C  [birth year]17[birth year]19  . FOOT EXAM  [birth year]29  . OPHTHALMOLOGY EXAM   [birth year]19[birth year]  . URINE MICROALBUMIN  [birth year]1929  . PNA vac Low Risk Adult (2 of 2 - PPSV23) 08/19/2018  . INFLUENZA VACCINE  Completed  . DEXA SCAN  Completed   Fall Risk  07/05/2018 06/29/2017 03/21/2015 08/23/2014 09/29/2012  Falls in the past year? No - No No No  Number falls in past yr: - 1 - - -  Injury with Fall? - No - - -   Functional Status Survey:    Vitals:   10/12/18 1316  BP: (!) 120/58  Pulse: 87  Resp: 18  Temp: 98.2 F (36.8 C)  SpO2: 95%  Weight: 108 lb 6.4 oz (49.2 kg)  Height: 5\' 4"  (1.626 m)   Body mass index is 18.61 kg/m. Physical Exam  Constitutional: She appears well-developed and well-nourished.  HENT:  Head: Normocephalic and atraumatic.  Eyes: Pupils are equal, round, and reactive to light. EOM are normal.  Left eye low vision  Neck: Normal range of motion. Neck supple. No JVD present.  Cardiovascular: Normal rate and regular rhythm.  Murmur heard. Pulmonary/Chest: Effort normal. She has no wheezes. She has no rales.  Abdominal: Soft. She exhibits no distension. There  is no tenderness. There is no rebound and no guarding.  Musculoskeletal: She exhibits no edema.  Ambulates with walker.   Neurological: She is alert. No cranial nerve deficit. She exhibits normal muscle tone. Coordination normal.  Oriented to self.   Skin: Skin is warm and dry.  Psychiatric: She has a normal mood and affect.    Labs reviewed: Recent Labs    11/11/17 04/14/18 10/05/18  NA 139 142 143  K 4.5 4.9 4.4  CL  --  109 106  CO2  --  29 29  BUN 33* 33* 30*  CREATININE 0.7 1.3* 1.4*  CALCIUM  --  8.6 8.7   Recent Labs    11/11/17 12/30/17 04/14/18  AST 17 19 16   ALT 10 16 13   ALKPHOS 63 63 53  ALBUMIN  --   --  3.5   Recent Labs    11/11/17 04/14/18  WBC 5.7 3.8  HGB 12.6 11.4*  HCT 37 34*  PLT 320 179   Lab Results  Component Value Date   TSH 2.34 09/14/2017   No results found for: HGBA1C Lab Results  Component Value Date   CHOL 174 12/30/2017    HDL 89 (A) 12/30/2017   LDLCALC 75 12/30/2017   TRIG 34 (A) 12/30/2017    Significant Diagnostic Results in last 30 days:  No results found.  Assessment/Plan Mixed Alzheimer's and vascular dementia Continue SNF [facility] for safety and care assistance, continue Memantine 10mg  bid.   Chronic constipation Stable, continue MOM qd.   Depression due to dementia ([place]) Will increase Sertraline 25mg  po qd. Observe.   Weight loss #4Ind weight loss in the past month. Weekly weight, encourage and assist the patient with oral intake.       Family/ staff Communication: plan of care reviewed with the patient and charge nurse.   Labs/tests ordered:  none  Time spend 25 minutes.

## 2018-10-12 NOTE — Assessment & Plan Note (Signed)
Continue SNF [facility] for safety and care assistance, continue Memantine 10mg bid.  

## 2018-10-12 NOTE — Assessment & Plan Note (Signed)
#  4Ind weight loss in the past month. Weekly weight, encourage and assist the patient with oral intake.

## 2018-10-12 NOTE — Assessment & Plan Note (Signed)
Stable, continue MOM qd.

## 2018-10-14 ENCOUNTER — Encounter: Payer: Self-pay | Admitting: Nurse Practitioner

## 2018-11-11 ENCOUNTER — Encounter: Payer: Self-pay | Admitting: Nurse Practitioner

## 2018-11-11 ENCOUNTER — Non-Acute Institutional Stay (SKILLED_NURSING_FACILITY): Payer: Medicare Other | Admitting: Nurse Practitioner

## 2018-11-11 DIAGNOSIS — F329 Major depressive disorder, single episode, unspecified: Secondary | ICD-10-CM

## 2018-11-11 DIAGNOSIS — F0150 Vascular dementia without behavioral disturbance: Secondary | ICD-10-CM

## 2018-11-11 DIAGNOSIS — E46 Unspecified protein-calorie malnutrition: Secondary | ICD-10-CM

## 2018-11-11 DIAGNOSIS — F0393 Unspecified dementia, unspecified severity, with mood disturbance: Secondary | ICD-10-CM

## 2018-11-11 DIAGNOSIS — R440 Auditory hallucinations: Secondary | ICD-10-CM

## 2018-11-11 DIAGNOSIS — G309 Alzheimer's disease, unspecified: Secondary | ICD-10-CM | POA: Diagnosis not present

## 2018-11-11 DIAGNOSIS — K5909 Other constipation: Secondary | ICD-10-CM

## 2018-11-11 DIAGNOSIS — F0280 Dementia in other diseases classified elsewhere without behavioral disturbance: Secondary | ICD-10-CM

## 2018-11-11 DIAGNOSIS — R634 Abnormal weight loss: Secondary | ICD-10-CM

## 2018-11-11 NOTE — Progress Notes (Addendum)
Location:  [facility] Room Number: 49 Place of Service:  SNF (31) Provider:  Marlana Latus  NP  Kenry Daubert X, NP  Patient Care Team: Batsheva Stevick X, NP as PCP - General (Internal Medicine) [place] Minus Breeding, MD as Consulting Physician (Cardiology) Nathanial Rancher, MD as Consulting Physician (Ophthalmology) Leta Baptist, MD as Consulting Physician (Otolaryngology) Traeger Sultana X, NP as Nurse Practitioner (Internal Medicine)  Extended Emergency Contact Information Primary Emergency Contact: McGreggor,Wynn  Johnnette Litter of [place] Phone: [phone] Mobile Phone: [phone] Relation: Friend Secondary Emergency Contact: Daniel,Gloria Address: [place], [place] of [place] Phone: [phone] Mobile Phone: [phone] Relation: Friend  Code Status:  DNR Goals of care: Advanced Directive information Advanced Directives 11/11/2018  Does Patient Have a Medical Advance Directive? Yes  Type of Paramedic of [place];Living will;Out of facility DNR (pink MOST or yellow form)  Does patient want to make changes to medical advance directive? No - Patient declined  Copy of [place] in Chart? Yes - validated most recent copy scanned in chart (See row information)  Pre-existing out of facility DNR order (yellow form or pink MOST form) Yellow form placed in chart (order not valid for inpatient use)     Chief Complaint  Patient presents with  . Medical Management of Chronic Issues    HPI:  Pt is a 85 y.o. female seen today for medical management of chronic diseases.     Hx of dementia, resides in [facility], on Memantine 10mg  bid, Donepezil 5mg  qd for memory. Her mood is stable on Sertraline 25mg  qd. Auditory hallucination as evidenced in carrying out conversation with someone who is not present to others. No constipation while on MOM 30mg  qd.  Past Medical  History:  Diagnosis Date  . Alzheimer's disease ([place]) 01/10/2015   MMSE 25/30  . Anxiety    mild  . Anxiety 01/11/2014  . Aortic stenosis    a. Echo 03/2010:  EF 70-75%, mild AS  . Cerebral atrophy ([place]) 01/10/2015  . Cerebrovascular disease 11/01/2005   Small vessel disease  . Chronic neck pain 08/06/2011  . Closed pelvic fracture ([place]) 09/29/12   left nondisplaced inferior pelvic ramus  . Glaucoma    "both eyes; no vision in left eye as a result" (04/06/2013)  . History of recurrent UTIs   . Hypertension   . Osteoarthritis 08/06/2011  . Pneumonia 04/06/2013   "for the first time" (04/06/2013)  . PVC (premature ventricular contraction)   . Senile osteoporosis 04/08/2011  . SVT (supraventricular tachycardia) ([place])   . Thumb pain 08/06/2011  . Vision loss of left eye    "can't see at all out of it" (04/06/2013)   Past Surgical History:  Procedure Laterality Date  . APPENDECTOMY    . CATARACT EXTRACTION, BILATERAL Bilateral   . GLAUCOMA SURGERY Left    "put in stent for glaucoma" (04/06/2013)  . TONSILLECTOMY      Allergies  Allergen Reactions  . Codeine Nausea Only  . Sulfate     Outpatient Encounter Medications as of 11/11/2018  Medication Sig  . acetaminophen (TYLENOL) 325 MG tablet Take 650 mg by mouth every 4 (four) hours as needed.   Marland Kitchen atorvastatin (LIPITOR) 10 MG tablet Take 10 mg by mouth daily.  . Calcium Citrate-Vitamin D 315-250 MG-UNIT TABS Take 1 tablet by mouth 2 (two) times daily.   Marland Kitchen  cholecalciferol (VITAMIN D) 1000 units tablet Take 1,000 Units by mouth daily.  Marland Kitchen donepezil (ARICEPT) 5 MG tablet Take 5 mg by mouth at bedtime.  . feeding supplement (BOOST / RESOURCE BREEZE) LIQD Take 125 mLs by mouth 2 (two) times daily between meals.  . magnesium hydroxide (MILK OF MAGNESIA) 400 MG/5ML suspension Take 30 mLs by mouth at bedtime.  . memantine (NAMENDA) 10 MG tablet Take 10 mg by mouth 2 (two) times daily.  Vladimir Faster Glycol-Propyl Glycol (SYSTANE) 0.4-0.3 % GEL  ophthalmic gel Place 1 application into both eyes 4 (four) times daily. For dryness   . sertraline (ZOLOFT) 25 MG tablet Take 25 mg by mouth daily.   . sodium chloride (MURO 128) 5 % ophthalmic ointment Place 1 application into the left eye at bedtime.   No facility-administered encounter medications on file as of 11/11/2018.    ROS was provided with assistance of staff Review of Systems  Constitutional: Positive for unexpected weight change. Negative for activity change, appetite change, chills, diaphoresis and fatigue.  HENT: Positive for hearing loss. Negative for congestion and voice change.   Eyes: Positive for visual disturbance.       Left eye blind.   Respiratory: Negative for cough, shortness of breath and wheezing.   Cardiovascular: Negative for chest pain, palpitations and leg swelling.  Gastrointestinal: Negative for abdominal distention, abdominal pain, constipation, diarrhea, nausea and vomiting.  Genitourinary: Negative for difficulty urinating, dysuria and urgency.  Musculoskeletal: Positive for arthralgias and gait problem.  Skin: Negative for color change.  Neurological: Negative for dizziness, speech difficulty, weakness and headaches.       Dementia  Psychiatric/Behavioral: Positive for behavioral problems, confusion and hallucinations. Negative for agitation and sleep disturbance. The patient is not nervous/anxious.        Carried out conversation with someone, who was not presented to others.     Immunization History  Administered Date(s) Administered  . Influenza Split 09/22/2011, 09/13/2012  . Influenza Whole 08/11/2018  . Influenza-Unspecified 08/09/2013, 08/27/2014, 08/08/2015, 08/25/2016, 08/31/2017  . Pneumococcal Conjugate-13 08/19/2017  . Tdap 09/13/2012   Pertinent  Health Maintenance Due  Topic Date Due  . HEMOGLOBIN A1C  11/24/18  . FOOT EXAM  03/03/1927  . OPHTHALMOLOGY EXAM  [birth year]03/1927  . URINE MICROALBUMIN  [birth year]03[birth year]1929  . PNA vac Low Risk Adult  (2 of 2 - PPSV23) 08/19/2018  . INFLUENZA VACCINE  Completed  . DEXA SCAN  Completed   Fall Risk  07/05/2018 06/29/2017 03/21/2015 08/23/2014 09/29/2012  Falls in the past year? No - No No No  Number falls in past yr: - 1 - - -  Injury with Fall? - No - - -   Functional Status Survey:    Vitals:   11/11/18 1157  BP: 120/70  Pulse: 68  Resp: 18  Temp: (!) 96.8 F (36 C)  SpO2: 93%  Weight: 100 lb 3.2 oz (45.5 kg)  Height: 5\' 4"  (1.626 m)   Body mass index is 17.2 kg/m. Physical Exam Constitutional:      Appearance: Normal appearance.  HENT:     Head: Normocephalic and atraumatic.     Nose: Nose normal.     Mouth/Throat:     Mouth: Mucous membranes are moist.  Eyes:     Pupils: Pupils are equal, round, and reactive to light.  Neck:     Musculoskeletal: Normal range of motion and neck supple.  Cardiovascular:     Rate and Rhythm: Normal rate and regular rhythm.  Heart sounds: Murmur present.  Pulmonary:     Effort: Pulmonary effort is normal.     Breath sounds: Normal breath sounds. No wheezing, rhonchi or rales.  Chest:     Chest wall: No tenderness.  Abdominal:     General: There is no distension.     Palpations: Abdomen is soft.     Tenderness: There is no abdominal tenderness. There is no guarding or rebound.  Musculoskeletal:     Right lower leg: No edema.     Left lower leg: No edema.     Comments: Ambulates with walker.   Skin:    General: Skin is warm and dry.     Coloration: Skin is not pale.     Findings: No erythema.  Neurological:     General: No focal deficit present.     Mental Status: She is alert. Mental status is at baseline.     Motor: No weakness.     Coordination: Coordination normal.     Gait: Gait abnormal.     Comments: Oriented to self.   Psychiatric:        Mood and Affect: Mood normal.        Behavior: Behavior normal.     Labs reviewed: Recent Labs    04/14/18 10/05/18  NA 142 143  K 4.9 4.4  CL 109 106  CO2 29 29    BUN 33* 30*  CREATININE 1.3* 1.4*  CALCIUM 8.6 8.7   Recent Labs    12/30/17 04/14/18  AST 19 16  ALT 16 13  ALKPHOS 63 53  ALBUMIN  --  3.5   Recent Labs    04/14/18  WBC 3.8  HGB 11.4*  HCT 34*  PLT 179   Lab Results  Component Value Date   TSH 2.34 09/14/2017   No results found for: HGBA1C Lab Results  Component Value Date   CHOL 174 12/30/2017   HDL 89 (A) 12/30/2017   LDLCALC 75 12/30/2017   TRIG 34 (A) 12/30/2017    Significant Diagnostic Results in last 30 days:  No results found.  Assessment/Plan Auditory hallucination Will observe for now. She carries out conversation with someone who is not present to others.   Mixed Alzheimer's and vascular dementia Will continue SNF [facility] for safety and care assistance, continue Memantine 10mg  bid for memory.   Protein-calorie malnutrition ([place]) Multiple factorials, hospice service for comfort measures, gradual weight loss is evident. Continue supportive care .  Depression due to dementia ([place]) Mood is stable, continue Sertraline 25mg  qd.   Chronic constipation Stable, continue MOM 30mg  qd.   Weight loss Gradual,  POA desires Mirtazapine 7.5mg  qd, observe      Family/ staff Communication: plan of care reviewed with the patient and charge nurse   Labs/tests ordered:  none  Time spend 25 minutes.

## 2018-11-11 NOTE — Assessment & Plan Note (Signed)
Mood is stable, continue Sertraline 25mg qd.  

## 2018-11-11 NOTE — Assessment & Plan Note (Signed)
Multiple factorials, hospice service for comfort measures, gradual weight loss is evident. Continue supportive care .

## 2018-11-11 NOTE — Assessment & Plan Note (Signed)
Will observe for now. She carries out conversation with someone who is not present to others.

## 2018-11-11 NOTE — Assessment & Plan Note (Signed)
Will continue SN[facility] for safety and care assistance, continue Memantine 10mg  bid for memory.

## 2018-11-11 NOTE — Assessment & Plan Note (Signed)
Stable, continue MOM 30mg  qd.

## 2018-11-16 NOTE — Assessment & Plan Note (Signed)
Gradual,  POA desires Mirtazapine 7.5mg  qd, observe

## 2018-11-28 ENCOUNTER — Encounter: Payer: Self-pay | Admitting: Nurse Practitioner

## 2018-11-28 ENCOUNTER — Non-Acute Institutional Stay (SKILLED_NURSING_FACILITY): Payer: Medicare Other | Admitting: Nurse Practitioner

## 2018-11-28 DIAGNOSIS — G309 Alzheimer's disease, unspecified: Secondary | ICD-10-CM

## 2018-11-28 DIAGNOSIS — F0150 Vascular dementia without behavioral disturbance: Secondary | ICD-10-CM

## 2018-11-28 DIAGNOSIS — R627 Adult failure to thrive: Secondary | ICD-10-CM | POA: Insufficient documentation

## 2018-11-28 DIAGNOSIS — F0280 Dementia in other diseases classified elsewhere without behavioral disturbance: Secondary | ICD-10-CM

## 2018-11-28 DIAGNOSIS — F0393 Unspecified dementia, unspecified severity, with mood disturbance: Secondary | ICD-10-CM

## 2018-11-28 DIAGNOSIS — F329 Major depressive disorder, single episode, unspecified: Secondary | ICD-10-CM

## 2018-11-28 NOTE — Assessment & Plan Note (Signed)
Continue Mirtazapine and Sertraline for mood. Observe.

## 2018-11-28 NOTE — Assessment & Plan Note (Signed)
Progressing, auditory hallucination, delusional thoughts-are able to redirected, her goal of care is comfort measures, will continue Hospice service and supportive care. Dc Memantine/Donepezil, Atorvastatin, Ca, Vit D no longer beneficial from these meds.

## 2018-11-28 NOTE — Progress Notes (Signed)
Location:  [facility] Room Number: 65 Place of Service:  SNF (31) Provider:  Marlana Latus  NP  Twan Harkin X, NP  Patient Care Team: Lynix Bonine X, NP as PCP - General (Internal Medicine) [place] Minus Breeding, MD as Consulting Physician (Cardiology) Nathanial Rancher, MD as Consulting Physician (Ophthalmology) Leta Baptist, MD as Consulting Physician (Otolaryngology) Caelin Rosen X, NP as Nurse Practitioner (Internal Medicine)  Extended Emergency Contact Information Primary Emergency Contact: McGreggor,Wynn  Johnnette Litter of [place] Phone: [phone] Mobile Phone: [phone] Relation: Friend Secondary Emergency Contact: Daniel,Gloria Address: [place], [place] of [place] Phone: [phone] Mobile Phone: [phone] Relation: Friend  Code Status:  DNR Goals of care: Advanced Directive information Advanced Directives 11/11/2018  Does Patient Have a Medical Advance Directive? Yes  Type of Paramedic of [place];Living will;Out of facility DNR (pink MOST or yellow form)  Does patient want to make changes to medical advance directive? No - Patient declined  Copy of [place] in Chart? Yes - validated most recent copy scanned in chart (See row information)  Pre-existing out of facility DNR order (yellow form or pink MOST form) Yellow form placed in chart (order not valid for inpatient use)     Chief Complaint  Patient presents with  . Acute Visit    Confusion    HPI:  Pt is a 85 y.o. female seen today for an acute visit for increased confusion and generalized weakness. Her goal of care is comfort measures, no IVF/Labs, under Hospice service. She has auditory hallucination and delusional thoughts, but not harmful, able to be redirected. Smiles when spoke to, on Mirtazapine 7.5mg , Sertraline 25mg  for mood. HPI was provided with assistance of staff and  HPOAs.    Past Medical History:  Diagnosis Date  . Altered mental status 09/28/2018  . Alzheimer's disease ([place]) 01/10/2015   MMSE 25/30  . Anxiety    mild  . Anxiety 01/11/2014  . Aortic stenosis    a. Echo 03/2010:  EF 70-75%, mild AS  . Cerebral atrophy ([place]) 01/10/2015  . Cerebrovascular disease 11/01/2005   Small vessel disease  . Chronic neck pain 08/06/2011  . Closed pelvic fracture ([place]) 09/29/12   left nondisplaced inferior pelvic ramus  . Glaucoma    "both eyes; no vision in left eye as a result" (04/06/2013)  . History of recurrent UTIs   . Hypertension   . Osteoarthritis 08/06/2011  . Pneumonia 04/06/2013   "for the first time" (04/06/2013)  . PVC (premature ventricular contraction)   . Senile osteoporosis 04/08/2011  . SVT (supraventricular tachycardia) ([employer])   . Thumb pain 08/06/2011  . Vision loss of left eye    "can't see at all out of it" (04/06/2013)   Past Surgical History:  Procedure Laterality Date  . APPENDECTOMY    . CATARACT EXTRACTION, BILATERAL Bilateral   . GLAUCOMA SURGERY Left    "put in stent for glaucoma" (04/06/2013)  . TONSILLECTOMY      Allergies  Allergen Reactions  . Codeine Nausea Only  . Sulfate     Outpatient Encounter Medications as of 11/28/2018  Medication Sig  . acetaminophen (TYLENOL) 325 MG tablet Take 650 mg by mouth every 4 (four) hours as needed.   Marland Kitchen atorvastatin (LIPITOR) 10 MG tablet Take 10 mg by mouth daily at 6 PM.   . Calcium Citrate-Vitamin D 315-250 MG-UNIT  TABS Take 1 tablet by mouth 2 (two) times daily.   . cholecalciferol (VITAMIN D) 1000 units tablet Take 1,000 Units by mouth daily.  Marland Kitchen donepezil (ARICEPT) 5 MG tablet Take 5 mg by mouth at bedtime.  . feeding supplement (BOOST / RESOURCE BREEZE) LIQD Take 125 mLs by mouth 2 (two) times daily between meals.  . magnesium hydroxide (MILK OF MAGNESIA) 400 MG/5ML suspension Take 30 mLs by mouth at bedtime.  . memantine (NAMENDA) 10 MG tablet Take 10 mg by mouth 2 (two)  times daily.  . mirtazapine (REMERON) 7.5 MG tablet Take 7.5 mg by mouth at bedtime.  Vladimir Faster Glycol-Propyl Glycol (SYSTANE) 0.4-0.3 % GEL ophthalmic gel Place 1 application into both eyes 4 (four) times daily. For dryness   . sertraline (ZOLOFT) 25 MG tablet Take 25 mg by mouth daily.   . sodium chloride (MURO 128) 5 % ophthalmic ointment Place 1 application into the left eye at bedtime.   No facility-administered encounter medications on file as of 11/28/2018.    ROS was provided with assistance of staff, HPOA x2 Review of Systems  Constitutional: Positive for activity change, appetite change and fatigue. Negative for chills, diaphoresis and fever.  HENT: Positive for hearing loss. Negative for congestion and voice change.   Eyes: Positive for visual disturbance.       Left eye blind  Respiratory: Negative for cough, shortness of breath and wheezing.   Cardiovascular: Positive for leg swelling. Negative for chest pain and palpitations.  Gastrointestinal: Negative for abdominal distention, abdominal pain, constipation, diarrhea, nausea and vomiting.  Genitourinary: Negative for difficulty urinating, dysuria and urgency.  Musculoskeletal: Positive for arthralgias and gait problem.  Skin: Negative for color change and pallor.  Neurological: Negative for dizziness, facial asymmetry, speech difficulty and weakness.       Dementia  Psychiatric/Behavioral: Positive for behavioral problems, confusion, hallucinations and sleep disturbance. Negative for agitation. The patient is not nervous/anxious.        Delusions    Immunization History  Administered Date(s) Administered  . Influenza Split 09/22/2011, 09/13/2012  . Influenza Whole 08/11/2018  . Influenza-Unspecified 08/09/2013, 08/27/2014, 08/08/2015, 08/25/2016, 08/31/2017  . Pneumococcal Conjugate-13 08/19/2017  . Tdap 09/13/2012   Pertinent  Health Maintenance Due  Topic Date Due  . HEMOGLOBIN A1C  July 20, 1917  . FOOT EXAM   03/03/1927  . OPHTHALMOLOGY EXAM  03/03/1927  . URINE MICROALBUMIN  [birth year]/03[birth year]1929  . PNA vac Low Risk Adult (2 of 2 - PPSV23) 08/19/2018  . INFLUENZA VACCINE  Completed  . DEXA SCAN  Completed   Fall Risk  07/05/2018 06/29/2017 03/21/2015 08/23/2014 09/29/2012  Falls in the past year? No - No No No  Number falls in past yr: - 1 - - -  Injury with Fall? - No - - -   Functional Status Survey:    Vitals:   11/28/18 1359  BP: 90/64  Pulse: 68  Resp: (!) 24  Temp: 98.3 F (36.8 C)  SpO2: 94%  Weight: 100 lb 3.2 oz (45.5 kg)  Height: 5\' 4"  (1.626 m)   Body mass index is 17.2 kg/m. Physical Exam Constitutional:      General: She is not in acute distress.    Appearance: Normal appearance. She is not ill-appearing or toxic-appearing.  HENT:     Head: Normocephalic.     Nose: Nose normal. No congestion or rhinorrhea.     Mouth/Throat:     Mouth: Mucous membranes are moist.  Eyes:     Extraocular  Movements: Extraocular movements intact.     Pupils: Pupils are equal, round, and reactive to light.  Neck:     Musculoskeletal: Normal range of motion.  Cardiovascular:     Rate and Rhythm: Normal rate and regular rhythm.     Heart sounds: Murmur present.  Pulmonary:     Effort: Pulmonary effort is normal.     Breath sounds: No wheezing, rhonchi or rales.  Abdominal:     General: There is no distension.     Palpations: Abdomen is soft.     Tenderness: There is no abdominal tenderness. There is no guarding or rebound.  Musculoskeletal:     Right lower leg: Edema present.     Left lower leg: Edema present.     Comments: Trace edema BLE. Ambulates with walker.   Skin:    General: Skin is warm and dry.     Findings: No bruising, erythema or lesion.  Neurological:     General: No focal deficit present.     Mental Status: She is alert. She is disoriented.     Motor: No weakness.     Coordination: Coordination normal.     Gait: Gait abnormal.     Comments: Oriented to self.     Psychiatric:        Mood and Affect: Mood normal.     Labs reviewed: Recent Labs    04/14/18 10/05/18  NA 142 143  K 4.9 4.4  CL 109 106  CO2 29 29  BUN 33* 30*  CREATININE 1.3* 1.4*  CALCIUM 8.6 8.7   Recent Labs    12/30/17 04/14/18  AST 19 16  ALT 16 13  ALKPHOS 63 53  ALBUMIN  --  3.5   Recent Labs    04/14/18  WBC 3.8  HGB 11.4*  HCT 34*  PLT 179   Lab Results  Component Value Date   TSH 2.34 09/14/2017   No results found for: HGBA1C Lab Results  Component Value Date   CHOL 174 12/30/2017   HDL 89 (A) 12/30/2017   LDLCALC 75 12/30/2017   TRIG 34 (A) 12/30/2017    Significant Diagnostic Results in last 30 days:  No results found.  Assessment/Plan Mixed Alzheimer's and vascular dementia Progressing, auditory hallucination, delusional thoughts-are able to redirected, her goal of care is comfort measures, will continue Hospice service and supportive care. Dc Memantine/Donepezil, Atorvastatin, Ca, Vit D no longer beneficial from these meds.  Depression due to dementia ([place]) Continue Mirtazapine and Sertraline for mood. Observe.   Adult failure to thrive Continue supportive care, hospice service, simplify meds, no further labs/IVF.     Family/ staff Communication: plan of care reviewed with the patient and charge nurse.   Labs/tests ordered:  None  Time spend 25 minutes.

## 2018-11-28 NOTE — Assessment & Plan Note (Signed)
Continue supportive care, hospice service, simplify meds, no further labs/IVF.

## 2018-12-07 ENCOUNTER — Other Ambulatory Visit: Payer: Self-pay | Admitting: *Deleted

## 2018-12-07 MED ORDER — LORAZEPAM 0.5 MG PO TABS: 0.5000 mg | ORAL_TABLET | Freq: Every day | ORAL | 0 refills | Status: DC

## 2018-12-07 MED ORDER — LORAZEPAM 0.5 MG PO TABS: 0.5000 mg | ORAL_TABLET | Freq: Four times a day (QID) | ORAL | 0 refills | Status: DC | PRN

## 2018-12-27 ENCOUNTER — Encounter: Payer: Self-pay | Admitting: Internal Medicine

## 2018-12-27 ENCOUNTER — Non-Acute Institutional Stay (SKILLED_NURSING_FACILITY): Payer: Medicare Other | Admitting: Internal Medicine

## 2018-12-27 DIAGNOSIS — R627 Adult failure to thrive: Secondary | ICD-10-CM | POA: Diagnosis not present

## 2018-12-27 DIAGNOSIS — F329 Major depressive disorder, single episode, unspecified: Secondary | ICD-10-CM | POA: Diagnosis not present

## 2018-12-27 DIAGNOSIS — G309 Alzheimer's disease, unspecified: Secondary | ICD-10-CM | POA: Diagnosis not present

## 2018-12-27 DIAGNOSIS — F0150 Vascular dementia without behavioral disturbance: Secondary | ICD-10-CM | POA: Diagnosis not present

## 2018-12-27 DIAGNOSIS — F0393 Unspecified dementia, unspecified severity, with mood disturbance: Secondary | ICD-10-CM

## 2018-12-27 DIAGNOSIS — F0280 Dementia in other diseases classified elsewhere without behavioral disturbance: Secondary | ICD-10-CM

## 2018-12-27 NOTE — Progress Notes (Signed)
Location:  [facility] Room Number: 23 Place of Service:  SNF (31) Provider:    Mast, Man X, NP  Patient Care Team: Mast, Man X, NP as PCP - General (Internal Medicine) [place] Minus Breeding, MD as Consulting Physician (Cardiology) Nathanial Rancher, MD as Consulting Physician (Ophthalmology) Leta Baptist, MD as Consulting Physician (Otolaryngology) Mast, Man X, NP as Nurse Practitioner (Internal Medicine)  Extended Emergency Contact Information Primary Emergency Contact: McGreggor,Wynn  Johnnette Litter of [place] Phone: [phone] Mobile Phone: [phone] Relation: Friend Secondary Emergency Contact: Daniel,Gloria Address: [place], [place] of [place] Phone: [phone] Mobile Phone: [phone] Relation: Friend  Code Status:  DNR  Goals of care: Advanced Directive information Advanced Directives 12/27/2018  Does Patient Have a Medical Advance Directive? Yes  Type of Paramedic of [place];Out of facility DNR (pink MOST or yellow form)  Does patient want to make changes to medical advance directive? No - Patient declined  Copy of [place] in Chart? Yes - validated most recent copy scanned in chart (See row information)  Pre-existing out of facility DNR order (yellow form or pink MOST form) Yellow form placed in chart (order not valid for inpatient use);Pink MOST form placed in chart (order not valid for inpatient use)     Chief Complaint  Patient presents with  . Medical Management of Chronic Issues    Routine visit     HPI:  Pt is a 85 y.o. female seen today for medical management of chronic diseases.   Patient has h/o Dementia, Depression and some Anxiety Patient is under hospice care She is stable in the facility. Per Nurses no new complains. She did have  Fall today with no injury. Patient has lost almost 20 lbs in past 3  months. She is on low dose of Remeron She did not have any acute complains.. Past Medical History:  Diagnosis Date  . Altered mental status 09/28/2018  . Alzheimer's disease ([place]) 01/10/2015   MMSE 25/30  . Anxiety    mild  . Anxiety 01/11/2014  . Aortic stenosis    a. Echo 03/2010:  EF 70-75%, mild AS  . Cerebral atrophy ([place]) 01/10/2015  . Cerebrovascular disease 11/01/2005   Small vessel disease  . Chronic neck pain 08/06/2011  . Closed pelvic fracture ([place]) 09/29/12   left nondisplaced inferior pelvic ramus  . Glaucoma    "both eyes; no vision in left eye as a result" (04/06/2013)  . History of recurrent UTIs   . Hypertension   . Osteoarthritis 08/06/2011  . Pneumonia 04/06/2013   "for the first time" (04/06/2013)  . PVC (premature ventricular contraction)   . Senile osteoporosis 04/08/2011  . SVT (supraventricular tachycardia) ([place])   . Thumb pain 08/06/2011  . Vision loss of left eye    "can't see at all out of it" (04/06/2013)   Past Surgical History:  Procedure Laterality Date  . APPENDECTOMY    . CATARACT EXTRACTION, BILATERAL Bilateral   . GLAUCOMA SURGERY Left    "put in stent for glaucoma" (04/06/2013)  . TONSILLECTOMY      Allergies  Allergen Reactions  . Codeine Nausea Only  . Sulfate     Outpatient Encounter Medications as of 12/27/2018  Medication Sig  . acetaminophen (TYLENOL) 325 MG tablet Take 650 mg by mouth every 4 (four) hours as needed.   . feeding supplement (BOOST /  RESOURCE BREEZE) LIQD Take 125 mLs by mouth 2 (two) times daily between meals.  Marland Kitchen LORazepam (ATIVAN) 0.5 MG tablet Take 1 tablet (0.5 mg total) by mouth at bedtime.  Marland Kitchen LORazepam (ATIVAN) 0.5 MG tablet Take 1 tablet (0.5 mg total) by mouth every 6 (six) hours as needed for anxiety.  . magnesium hydroxide (MILK OF MAGNESIA) 400 MG/5ML suspension Take 30 mLs by mouth at bedtime.  . mirtazapine (REMERON) 7.5 MG tablet Take 7.5 mg by mouth at bedtime.  Vladimir Faster Glycol-Propyl Glycol (SYSTANE)  0.4-0.3 % GEL ophthalmic gel Place 1 application into both eyes 4 (four) times daily. For dryness   . sertraline (ZOLOFT) 25 MG tablet Take 25 mg by mouth daily.   . sodium chloride (MURO 128) 5 % ophthalmic ointment Place 1 application into the left eye at bedtime.  . [DISCONTINUED] atorvastatin (LIPITOR) 10 MG tablet Take 10 mg by mouth daily at 6 PM.   . [DISCONTINUED] Calcium Citrate-Vitamin D 315-250 MG-UNIT TABS Take 1 tablet by mouth 2 (two) times daily.   . [DISCONTINUED] cholecalciferol (VITAMIN D) 1000 units tablet Take 1,000 Units by mouth daily.  . [DISCONTINUED] donepezil (ARICEPT) 5 MG tablet Take 5 mg by mouth at bedtime.  . [DISCONTINUED] memantine (NAMENDA) 10 MG tablet Take 10 mg by mouth 2 (two) times daily.   No facility-administered encounter medications on file as of 12/27/2018.     Review of Systems  Unable to perform ROS: Dementia    Immunization History  Administered Date(s) Administered  . Influenza Split 09/22/2011, 09/13/2012  . Influenza Whole 08/11/2018  . Influenza-Unspecified 08/09/2013, 08/27/2014, 08/08/2015, 08/25/2016, 08/31/2017  . Pneumococcal Conjugate-13 08/19/2017  . Tdap 09/13/2012   Pertinent  Health Maintenance Due  Topic Date Due  . HEMOGLOBIN A1C  09-27-19  . FOOT EXAM  03/03/1927  . OPHTHALMOLOGY EXAM  03/03/1927  . URINE MICROALBUMIN  03/03/1927  . PNA vac Low Risk Adult (2 of 2 - PPSV23) 08/19/2018  . INFLUENZA VACCINE  Completed  . DEXA SCAN  Completed   Fall Risk  07/05/2018 06/29/2017 03/21/2015 08/23/2014 09/29/2012  Falls in the past year? No - No No No  Number falls in past yr: - 1 - - -  Injury with Fall? - No - - -   Functional Status Survey:    Vitals:   12/27/18 0936  BP: (!) 99/48  Pulse: 80  Resp: 18  Temp: 98.7 F (37.1 C)  SpO2: 93%  Weight: 93 lb 4.8 oz (42.3 kg)  Height: 5\' 4"  (1.626 m)   Body mass index is 16.01 kg/m. Physical Exam Constitutional:      Appearance: Normal appearance.  HENT:      Head: Normocephalic.     Nose: Nose normal.     Mouth/Throat:     Mouth: Mucous membranes are moist.     Pharynx: Oropharynx is clear.  Eyes:     Pupils: Pupils are equal, round, and reactive to light.  Neck:     Musculoskeletal: Neck supple.  Cardiovascular:     Rate and Rhythm: Normal rate. Rhythm irregular.     Pulses: Normal pulses.     Heart sounds: Normal heart sounds.  Pulmonary:     Effort: Pulmonary effort is normal.     Breath sounds: Normal breath sounds.  Abdominal:     General: Abdomen is flat. Bowel sounds are normal.     Palpations: Abdomen is soft.  Musculoskeletal:        General: No swelling.  Skin:    Comments: Had small skin tear and bruises on Right Arm  Neurological:     General: No focal deficit present.     Mental Status: She is alert.     Comments: Did Know her DOB. And was oriented to place  Psychiatric:        Mood and Affect: Mood normal.        Behavior: Behavior normal.        Thought Content: Thought content normal.     Labs reviewed: Recent Labs    04/14/18 10/05/18  NA 142 143  K 4.9 4.4  CL 109 106  CO2 29 29  BUN 33* 30*  CREATININE 1.3* 1.4*  CALCIUM 8.6 8.7   Recent Labs    12/30/17 04/14/18  AST 19 16  ALT 16 13  ALKPHOS 63 53  ALBUMIN  --  3.5   Recent Labs    04/14/18  WBC 3.8  HGB 11.4*  HCT 34*  PLT 179   Lab Results  Component Value Date   TSH 2.34 09/14/2017   No results found for: HGBA1C Lab Results  Component Value Date   CHOL 174 12/30/2017   HDL 89 (A) 12/30/2017   LDLCALC 75 12/30/2017   TRIG 34 (A) 12/30/2017    Significant Diagnostic Results in last 30 days:  No results found.  Assessment/Plan Dementia with Failure to thrive On Remeron Namenda and Aricept were discontinued due to hospice care now.And no benefits. Will increase her Remeron to 15 mg She is also on Low dose of Ativan for behavior issues  Depression Continue Zoloft and Remeron Patient is Comfort care under  Hospice  Family/ staff Communication:   Labs/tests ordered:   Total time spent in this patient care encounter was 25_ minutes; greater than 50% of the visit spent counseling patient, reviewing records , Labs and coordinating care for problems addressed at this encounter.

## 2019-01-19 ENCOUNTER — Encounter: Payer: Self-pay | Admitting: Nurse Practitioner

## 2019-01-19 ENCOUNTER — Non-Acute Institutional Stay (SKILLED_NURSING_FACILITY): Payer: Medicare Other | Admitting: Nurse Practitioner

## 2019-01-19 DIAGNOSIS — G309 Alzheimer's disease, unspecified: Secondary | ICD-10-CM

## 2019-01-19 DIAGNOSIS — K5909 Other constipation: Secondary | ICD-10-CM | POA: Diagnosis not present

## 2019-01-19 DIAGNOSIS — F0150 Vascular dementia without behavioral disturbance: Secondary | ICD-10-CM

## 2019-01-19 DIAGNOSIS — F0391 Unspecified dementia with behavioral disturbance: Secondary | ICD-10-CM | POA: Diagnosis not present

## 2019-01-19 DIAGNOSIS — F0280 Dementia in other diseases classified elsewhere without behavioral disturbance: Secondary | ICD-10-CM

## 2019-01-19 DIAGNOSIS — F03918 Unspecified dementia, unspecified severity, with other behavioral disturbance: Secondary | ICD-10-CM

## 2019-01-19 NOTE — Progress Notes (Signed)
Location:  [facility] Room Number: 37 Place of Service:  SNF (31) Provider:  Marlana Latus NP  Leshia Kope X, NP  Patient Care Team: Gianina Olinde X, NP as PCP - General (Internal Medicine) [place] Minus Breeding, MD as Consulting Physician (Cardiology) Nathanial Rancher, MD as Consulting Physician (Ophthalmology) Leta Baptist, MD as Consulting Physician (Otolaryngology) Ardelia Wrede X, NP as Nurse Practitioner (Internal Medicine)  Extended Emergency Contact Information Primary Emergency Contact: McGreggor,Wynn  Johnnette Litter of [place] Phone: [phone] Mobile Phone: [phone] Relation: Friend Secondary Emergency Contact: Daniel,Gloria Address: [place], [place] of [place] Phone: [phone] Mobile Phone: [phone] Relation: Friend  Code Status:  DNR Goals of care: Advanced Directive information Advanced Directives 01/19/2019  Does Patient Have a Medical Advance Directive? Yes  Type of Paramedic of [place];Out of facility DNR (pink MOST or yellow form)  Does patient want to make changes to medical advance directive? No - Patient declined  Copy of [place] in Chart? Yes - validated most recent copy scanned in chart (See row information)  Pre-existing out of facility DNR order (yellow form or pink MOST form) Yellow form placed in chart (order not valid for inpatient use);Pink MOST form placed in chart (order not valid for inpatient use)     Chief Complaint  Patient presents with  . Medical Management of Chronic Issues    HPI:  Pt is a 85 y.o. female seen today for medical management of chronic diseases.     The patient resides in SNF [facility] for safety and care assistance, w/c for mobility, her mood is stabilized on Sertraline 25mg  qd, Mirtazapine 15mg  qd, Lorazepam 0.5mg  qhs and q6h prn. No constipation while on MOM daily.    Past Medical  History:  Diagnosis Date  . Altered mental status 09/28/2018  . Alzheimer's disease ([place]) 01/10/2015   MMSE 25/30  . Anxiety    mild  . Anxiety 01/11/2014  . Aortic stenosis    a. Echo 03/2010:  EF 70-75%, mild AS  . Cerebral atrophy ([place]) 01/10/2015  . Cerebrovascular disease 11/01/2005   Small vessel disease  . Chronic neck pain 08/06/2011  . Closed pelvic fracture ([place]) 09/29/12   left nondisplaced inferior pelvic ramus  . Glaucoma    "both eyes; no vision in left eye as a result" (04/06/2013)  . History of recurrent UTIs   . Hypertension   . Osteoarthritis 08/06/2011  . Pneumonia 04/06/2013   "for the first time" (04/06/2013)  . PVC (premature ventricular contraction)   . Senile osteoporosis 04/08/2011  . SVT (supraventricular tachycardia) ([place])   . Thumb pain 08/06/2011  . Vision loss of left eye    "can't see at all out of it" (04/06/2013)   Past Surgical History:  Procedure Laterality Date  . APPENDECTOMY    . CATARACT EXTRACTION, BILATERAL Bilateral   . GLAUCOMA SURGERY Left    "put in stent for glaucoma" (04/06/2013)  . TONSILLECTOMY      Allergies  Allergen Reactions  . Codeine Nausea Only  . Sulfate     Outpatient Encounter Medications as of 01/19/2019  Medication Sig  . acetaminophen (TYLENOL) 325 MG tablet Take 650 mg by mouth every 4 (four) hours as needed.   . feeding supplement (BOOST / RESOURCE BREEZE) LIQD Take 125 mLs by mouth 2 (two) times daily between meals.  Marland Kitchen LORazepam (ATIVAN) 0.5  MG tablet Take 1 tablet (0.5 mg total) by mouth every 6 (six) hours as needed for anxiety.  Marland Kitchen LORazepam (ATIVAN) 0.5 MG tablet Take 0.5 mg by mouth at bedtime.   . magnesium hydroxide (MILK OF MAGNESIA) 400 MG/5ML suspension Take 30 mLs by mouth at bedtime.  . mirtazapine (REMERON) 15 MG tablet Take 15 mg by mouth at bedtime.  Vladimir Faster Glycol-Propyl Glycol (SYSTANE) 0.4-0.3 % GEL ophthalmic gel Place 1 application into both eyes 4 (four) times daily. For dryness   .  sertraline (ZOLOFT) 25 MG tablet Take 25 mg by mouth daily.   . sodium chloride (MURO 128) 5 % ophthalmic ointment Place 1 application into the left eye at bedtime.  . [DISCONTINUED] LORazepam (ATIVAN) 0.5 MG tablet Take 1 tablet (0.5 mg total) by mouth at bedtime. (Patient taking differently: Take 0.5 mg by mouth every 6 (six) hours as needed. )  . [DISCONTINUED] mirtazapine (REMERON) 7.5 MG tablet Take 7.5 mg by mouth at bedtime.   No facility-administered encounter medications on file as of 01/19/2019.    ROS was provided with assistance of staff Review of Systems  Constitutional: Negative for activity change, appetite change, chills, diaphoresis, fatigue, fever and unexpected weight change.  HENT: Positive for hearing loss. Negative for congestion and voice change.   Eyes: Positive for visual disturbance.       Left eye blind.  Respiratory: Negative for cough, shortness of breath and wheezing.   Gastrointestinal: Negative for abdominal distention, abdominal pain, constipation, diarrhea, nausea and vomiting.  Genitourinary: Negative for difficulty urinating, dysuria and urgency.  Musculoskeletal: Positive for arthralgias and gait problem.  Skin: Negative for color change and pallor.  Neurological: Negative for dizziness, speech difficulty, weakness and headaches.       Dementia  Psychiatric/Behavioral: Negative for agitation, behavioral problems, hallucinations and sleep disturbance. The patient is not nervous/anxious.     Immunization History  Administered Date(s) Administered  . Influenza Split 09/22/2011, 09/13/2012  . Influenza Whole 08/11/2018  . Influenza-Unspecified 08/09/2013, 08/27/2014, 08/08/2015, 08/25/2016, 08/31/2017  . Pneumococcal Conjugate-13 08/19/2017  . Tdap 09/13/2012   Pertinent  Health Maintenance Due  Topic Date Due  . HEMOGLOBIN A1C  04-18-19  . FOOT EXAM  03/03/1927  . OPHTHALMOLOGY EXAM  03/03/1927  . URINE MICROALBUMIN  03/03/1927  . PNA vac Low  Risk Adult (2 of 2 - PPSV23) 08/19/2018  . INFLUENZA VACCINE  Completed  . DEXA SCAN  Completed   Fall Risk  07/05/2018 06/29/2017 03/21/2015 08/23/2014 09/29/2012  Falls in the past year? No - No No No  Number falls in past yr: - 1 - - -  Injury with Fall? - No - - -   Functional Status Survey:    Vitals:   01/19/19 1024  BP: (!) 100/58  Pulse: 68  Resp: (!) 22  Temp: (!) 96.9 F (36.1 C)  SpO2: 95%  Weight: 95 lb 3.2 oz (43.2 kg)  Height: 5\' 4"  (1.626 m)   Body mass index is 16.34 kg/m. Physical Exam Constitutional:      General: She is not in acute distress.    Appearance: Normal appearance. She is not ill-appearing, toxic-appearing or diaphoretic.     Comments: Low body weight  HENT:     Head: Normocephalic and atraumatic.     Nose: Nose normal.     Mouth/Throat:     Mouth: Mucous membranes are moist.  Eyes:     Extraocular Movements: Extraocular movements intact.     Pupils: Pupils  are equal, round, and reactive to light.  Neck:     Musculoskeletal: Normal range of motion and neck supple.  Cardiovascular:     Rate and Rhythm: Normal rate. Rhythm irregular.     Heart sounds: No murmur.  Pulmonary:     Effort: Pulmonary effort is normal.     Breath sounds: Normal breath sounds. No wheezing, rhonchi or rales.  Abdominal:     General: There is no distension.     Palpations: Abdomen is soft.     Tenderness: There is no abdominal tenderness. There is no guarding or rebound.  Musculoskeletal:     Right lower leg: No edema.     Left lower leg: No edema.  Skin:    General: Skin is warm and dry.  Neurological:     General: No focal deficit present.     Mental Status: She is alert. Mental status is at baseline.     Cranial Nerves: No cranial nerve deficit.     Motor: No weakness.     Coordination: Coordination normal.     Gait: Gait abnormal.     Comments: Oriented to person and place.   Psychiatric:        Mood and Affect: Mood normal.        Behavior:  Behavior normal.     Labs reviewed: Recent Labs    04/14/18 10/05/18  NA 142 143  K 4.9 4.4  CL 109 106  CO2 29 29  BUN 33* 30*  CREATININE 1.3* 1.4*  CALCIUM 8.6 8.7   Recent Labs    04/14/18  AST 16  ALT 13  ALKPHOS 53  ALBUMIN 3.5   Recent Labs    04/14/18  WBC 3.8  HGB 11.4*  HCT 34*  PLT 179   Lab Results  Component Value Date   TSH 2.34 09/14/2017   No results found for: HGBA1C Lab Results  Component Value Date   CHOL 174 12/30/2017   HDL 89 (A) 12/30/2017   LDLCALC 75 12/30/2017   TRIG 34 (A) 12/30/2017    Significant Diagnostic Results in last 30 days:  No results found.  Assessment/Plan Mixed Alzheimer's and vascular dementia Continue SNF [facility] for safety and care assistance, continue Hospice service.   Senile dementia with depression with behavioral disturbance ([place]) Her mood is stabilized, continue Sertraline 25mg  qd, Mirtazapine 15mg  qd.   Chronic constipation Stable, continue MOM 17ml po qd.      Family/ staff Communication: plan of care reviewed with the patient and charge nurse.    Labs/tests ordered:  none  Time spend 25 minutes.

## 2019-01-22 NOTE — Assessment & Plan Note (Signed)
Continue SNF [facility] for safety and care assistance, continue Hospice service.  

## 2019-01-22 NOTE — Assessment & Plan Note (Addendum)
Her mood is stabilized, continue Sertraline 25mg  qd, Mirtazapine 15mg  qd.

## 2019-01-22 NOTE — Assessment & Plan Note (Signed)
Stable, continue MOM 38ml po qd.

## 2019-01-23 ENCOUNTER — Encounter: Payer: Self-pay | Admitting: Nurse Practitioner

## 2019-02-03 ENCOUNTER — Other Ambulatory Visit: Payer: Self-pay | Admitting: *Deleted

## 2019-02-03 MED ORDER — LORAZEPAM 0.5 MG PO TABS: 0.5000 mg | ORAL_TABLET | Freq: Every day | ORAL | 0 refills | Status: AC

## 2019-02-06 ENCOUNTER — Other Ambulatory Visit: Payer: Self-pay

## 2019-02-06 MED ORDER — LORAZEPAM 0.5 MG PO TABS: 0.5000 mg | ORAL_TABLET | ORAL | 0 refills | Status: AC | PRN

## 2019-02-06 MED ORDER — MORPHINE SULFATE (CONCENTRATE) 20 MG/ML PO SOLN: 0.2500 mg | ORAL | 0 refills | Status: AC | PRN

## 2019-02-08 NOTE — Telephone Encounter (Signed)
Received fax from [facility] for refill

## 2019-02-08 DEATH — deceased
